# Patient Record
Sex: Female | Born: 1990 | Race: Black or African American | Hispanic: No | Marital: Single | Smoking: Former smoker
Health system: Southern US, Community
[De-identification: ages and names within clinical notes are randomized; demographics above are authoritative.]

## PROBLEM LIST (undated history)

## (undated) ENCOUNTER — Inpatient Hospital Stay (HOSPITAL_COMMUNITY): Payer: Self-pay

## (undated) DIAGNOSIS — K429 Umbilical hernia without obstruction or gangrene: Secondary | ICD-10-CM

## (undated) DIAGNOSIS — I219 Acute myocardial infarction, unspecified: Secondary | ICD-10-CM

## (undated) DIAGNOSIS — I213 ST elevation (STEMI) myocardial infarction of unspecified site: Secondary | ICD-10-CM

## (undated) DIAGNOSIS — I2542 Coronary artery dissection: Secondary | ICD-10-CM

## (undated) DIAGNOSIS — I319 Disease of pericardium, unspecified: Secondary | ICD-10-CM

## (undated) DIAGNOSIS — I249 Acute ischemic heart disease, unspecified: Secondary | ICD-10-CM

## (undated) DIAGNOSIS — B999 Unspecified infectious disease: Secondary | ICD-10-CM

## (undated) HISTORY — PX: CARDIAC CATHETERIZATION: SHX172

## (undated) HISTORY — PX: INDUCED ABORTION: SHX677

## (undated) HISTORY — PX: WISDOM TOOTH EXTRACTION: SHX21

---

## 2009-08-28 ENCOUNTER — Other Ambulatory Visit: Payer: Self-pay | Admitting: Emergency Medicine

## 2009-08-28 ENCOUNTER — Inpatient Hospital Stay (HOSPITAL_COMMUNITY): Admission: AD | Admit: 2009-08-28 | Discharge: 2009-08-28 | Payer: Self-pay | Admitting: Obstetrics & Gynecology

## 2009-08-29 ENCOUNTER — Inpatient Hospital Stay (HOSPITAL_COMMUNITY): Admission: AD | Admit: 2009-08-29 | Discharge: 2009-08-29 | Payer: Self-pay | Admitting: Obstetrics & Gynecology

## 2009-09-14 ENCOUNTER — Ambulatory Visit: Payer: Self-pay | Admitting: Obstetrics and Gynecology

## 2009-09-28 ENCOUNTER — Ambulatory Visit: Payer: Self-pay | Admitting: Obstetrics and Gynecology

## 2009-10-04 ENCOUNTER — Ambulatory Visit (HOSPITAL_COMMUNITY): Admission: RE | Admit: 2009-10-04 | Discharge: 2009-10-04 | Payer: Self-pay | Admitting: Obstetrics & Gynecology

## 2009-10-12 ENCOUNTER — Ambulatory Visit: Payer: Self-pay | Admitting: Obstetrics & Gynecology

## 2009-10-26 ENCOUNTER — Ambulatory Visit: Payer: Self-pay | Admitting: Obstetrics and Gynecology

## 2009-10-27 ENCOUNTER — Inpatient Hospital Stay (HOSPITAL_COMMUNITY): Admission: AD | Admit: 2009-10-27 | Discharge: 2009-10-27 | Payer: Self-pay | Admitting: Obstetrics & Gynecology

## 2009-11-01 ENCOUNTER — Ambulatory Visit: Payer: Self-pay | Admitting: Family

## 2009-11-01 ENCOUNTER — Inpatient Hospital Stay (HOSPITAL_COMMUNITY): Admission: AD | Admit: 2009-11-01 | Discharge: 2009-11-03 | Payer: Self-pay | Admitting: Obstetrics and Gynecology

## 2010-06-18 HISTORY — PX: SALPINGECTOMY: SHX328

## 2010-07-31 ENCOUNTER — Emergency Department (HOSPITAL_COMMUNITY): Payer: Self-pay

## 2010-07-31 ENCOUNTER — Emergency Department (HOSPITAL_COMMUNITY)
Admission: EM | Admit: 2010-07-31 | Discharge: 2010-07-31 | Disposition: A | Discharge status: Home / Self Care | Payer: Self-pay | Attending: Emergency Medicine | Admitting: Emergency Medicine

## 2010-07-31 DIAGNOSIS — N83209 Unspecified ovarian cyst, unspecified side: Secondary | ICD-10-CM | POA: Insufficient documentation

## 2010-07-31 DIAGNOSIS — N949 Unspecified condition associated with female genital organs and menstrual cycle: Secondary | ICD-10-CM | POA: Insufficient documentation

## 2010-07-31 LAB — CBC
Hemoglobin: 13.8 g/dL (ref 12.0–15.0)
MCH: 30.5 pg (ref 26.0–34.0)
MCV: 88.7 fL (ref 78.0–100.0)
RBC: 4.53 MIL/uL (ref 3.87–5.11)
RDW: 13.1 % (ref 11.5–15.5)
WBC: 4.8 10*3/uL (ref 4.0–10.5)

## 2010-07-31 LAB — URINALYSIS, ROUTINE W REFLEX MICROSCOPIC
Ketones, ur: 15 mg/dL — AB
Nitrite: NEGATIVE
Protein, ur: 30 mg/dL — AB
Specific Gravity, Urine: 1.029 (ref 1.005–1.030)
Urine Glucose, Fasting: NEGATIVE mg/dL
Urobilinogen, UA: 1 mg/dL (ref 0.0–1.0)

## 2010-07-31 LAB — DIFFERENTIAL
Basophils Relative: 0 % (ref 0–1)
Eosinophils Absolute: 0 10*3/uL (ref 0.0–0.7)
Lymphs Abs: 1.8 10*3/uL (ref 0.7–4.0)
Monocytes Absolute: 0.3 10*3/uL (ref 0.1–1.0)

## 2010-07-31 LAB — URINE MICROSCOPIC-ADD ON

## 2010-07-31 LAB — HCG, QUANTITATIVE, PREGNANCY: hCG, Beta Chain, Quant, S: <2 m[IU]/mL (ref <5)

## 2010-07-31 LAB — HEPATIC FUNCTION PANEL
Albumin: 3.7 g/dL (ref 3.5–5.2)
Bilirubin, Direct: 0.2 mg/dL (ref 0.0–0.3)
Total Bilirubin: 1.1 mg/dL (ref 0.3–1.2)
Total Protein: 6.5 g/dL (ref 6.0–8.3)

## 2010-07-31 LAB — WET PREP, GENITAL: WBC, Wet Prep HPF POC: NONE SEEN

## 2010-07-31 LAB — BASIC METABOLIC PANEL
CO2: 23 mEq/L (ref 19–32)
Calcium: 8.9 mg/dL (ref 8.4–10.5)
Creatinine, Ser: 0.7 mg/dL (ref 0.4–1.2)
GFR calc Af Amer: >60 mL/min (ref >60)
GFR calc non Af Amer: >60 mL/min (ref >60)
Glucose, Bld: 95 mg/dL (ref 70–99)
Sodium: 139 mEq/L (ref 135–145)

## 2010-07-31 LAB — POCT PREGNANCY, URINE: Preg Test, Ur: NEGATIVE

## 2010-08-01 LAB — GC/CHLAMYDIA PROBE AMP, GENITAL
Chlamydia, DNA Probe: NEGATIVE
GC Probe Amp, Genital: NEGATIVE

## 2010-09-04 LAB — CBC
Hemoglobin: 13.5 g/dL (ref 12.0–15.0)
MCV: 90.7 fL (ref 78.0–100.0)
Platelets: 231 10*3/uL (ref 150–400)
RDW: 15.2 % (ref 11.5–15.5)

## 2010-09-04 LAB — RAPID URINE DRUG SCREEN, HOSP PERFORMED
Barbiturates: NOT DETECTED
Benzodiazepines: NOT DETECTED
Cocaine: NOT DETECTED

## 2010-09-05 LAB — POCT URINALYSIS DIP (DEVICE)
Bilirubin Urine: NEGATIVE
Glucose, UA: NEGATIVE mg/dL
Ketones, ur: NEGATIVE mg/dL
Ketones, ur: NEGATIVE mg/dL
Protein, ur: 100 mg/dL — AB
Specific Gravity, Urine: 1.025 (ref 1.005–1.030)
Specific Gravity, Urine: 1.025 (ref 1.005–1.030)
Urobilinogen, UA: 0.2 mg/dL (ref 0.0–1.0)

## 2010-09-05 LAB — WET PREP, GENITAL: Clue Cells Wet Prep HPF POC: NONE SEEN

## 2010-09-06 LAB — POCT URINALYSIS DIP (DEVICE)
Bilirubin Urine: NEGATIVE
Glucose, UA: NEGATIVE mg/dL
Hgb urine dipstick: NEGATIVE
Specific Gravity, Urine: 1.02 (ref 1.005–1.030)
pH: 5 (ref 5.0–8.0)

## 2010-09-11 LAB — CBC
HCT: 34 % — ABNORMAL LOW (ref 36.0–46.0)
Hemoglobin: 11.3 g/dL — ABNORMAL LOW (ref 12.0–15.0)
MCHC: 33.3 g/dL (ref 30.0–36.0)
MCV: 92.8 fL (ref 78.0–100.0)
Platelets: 263 10*3/uL (ref 150–400)
RBC: 3.49 MIL/uL — ABNORMAL LOW (ref 3.87–5.11)
RBC: 3.67 MIL/uL — ABNORMAL LOW (ref 3.87–5.11)
WBC: 10.5 10*3/uL (ref 4.0–10.5)
WBC: 11.1 10*3/uL — ABNORMAL HIGH (ref 4.0–10.5)

## 2010-09-11 LAB — BASIC METABOLIC PANEL
BUN: 7 mg/dL (ref 6–23)
Calcium: 8.6 mg/dL (ref 8.4–10.5)
Creatinine, Ser: 0.64 mg/dL (ref 0.4–1.2)
GFR calc Af Amer: >60 mL/min (ref >60)
GFR calc non Af Amer: >60 mL/min (ref >60)

## 2010-09-11 LAB — WET PREP, GENITAL: Trich, Wet Prep: NONE SEEN

## 2010-09-11 LAB — URINALYSIS, ROUTINE W REFLEX MICROSCOPIC
Glucose, UA: NEGATIVE mg/dL
Glucose, UA: NEGATIVE mg/dL
Protein, ur: NEGATIVE mg/dL
Protein, ur: NEGATIVE mg/dL
Specific Gravity, Urine: 1.02 (ref 1.005–1.030)
pH: 6 (ref 5.0–8.0)

## 2010-09-11 LAB — HIV ANTIBODY (ROUTINE TESTING W REFLEX): HIV: NONREACTIVE

## 2010-09-11 LAB — GC/CHLAMYDIA PROBE AMP, GENITAL
Chlamydia, DNA Probe: POSITIVE — AB
GC Probe Amp, Genital: POSITIVE — AB

## 2010-09-11 LAB — DIFFERENTIAL
Basophils Relative: 1 % (ref 0–1)
Eosinophils Absolute: 0.2 10*3/uL (ref 0.0–0.7)
Eosinophils Absolute: 0.2 10*3/uL (ref 0.0–0.7)
Eosinophils Relative: 2 % (ref 0–5)
Lymphocytes Relative: 15 % (ref 12–46)
Lymphs Abs: 1.1 10*3/uL (ref 0.7–4.0)
Lymphs Abs: 1.6 10*3/uL (ref 0.7–4.0)
Monocytes Absolute: 0.4 10*3/uL (ref 0.1–1.0)
Monocytes Relative: 3 % (ref 3–12)
Neutro Abs: 8.2 10*3/uL — ABNORMAL HIGH (ref 1.7–7.7)
Neutrophils Relative %: 78 % — ABNORMAL HIGH (ref 43–77)

## 2010-09-11 LAB — POCT URINALYSIS DIP (DEVICE)
Bilirubin Urine: NEGATIVE
Glucose, UA: NEGATIVE mg/dL
Hgb urine dipstick: NEGATIVE
Nitrite: NEGATIVE
Specific Gravity, Urine: 1.01 (ref 1.005–1.030)
Urobilinogen, UA: 0.2 mg/dL (ref 0.0–1.0)

## 2010-09-11 LAB — URINE MICROSCOPIC-ADD ON

## 2010-09-11 LAB — URINE CULTURE: Colony Count: 35000

## 2010-09-11 LAB — SICKLE CELL SCREEN: Sickle Cell Screen: NEGATIVE

## 2010-09-11 LAB — ABO/RH: ABO/RH(D): A POS

## 2010-11-15 ENCOUNTER — Emergency Department (HOSPITAL_COMMUNITY)
Admission: EM | Admit: 2010-11-15 | Discharge: 2010-11-16 | Disposition: A | Discharge status: Home / Self Care | Payer: Self-pay | Attending: Emergency Medicine | Admitting: Emergency Medicine

## 2010-11-15 DIAGNOSIS — R197 Diarrhea, unspecified: Secondary | ICD-10-CM | POA: Insufficient documentation

## 2010-11-15 DIAGNOSIS — O4690 Antepartum hemorrhage, unspecified, unspecified trimester: Secondary | ICD-10-CM | POA: Insufficient documentation

## 2010-11-15 DIAGNOSIS — O98819 Other maternal infectious and parasitic diseases complicating pregnancy, unspecified trimester: Secondary | ICD-10-CM | POA: Insufficient documentation

## 2010-11-15 DIAGNOSIS — R109 Unspecified abdominal pain: Secondary | ICD-10-CM | POA: Insufficient documentation

## 2010-11-15 DIAGNOSIS — N949 Unspecified condition associated with female genital organs and menstrual cycle: Secondary | ICD-10-CM | POA: Insufficient documentation

## 2010-11-15 DIAGNOSIS — A599 Trichomoniasis, unspecified: Secondary | ICD-10-CM | POA: Insufficient documentation

## 2010-11-15 DIAGNOSIS — R112 Nausea with vomiting, unspecified: Secondary | ICD-10-CM | POA: Insufficient documentation

## 2010-11-15 LAB — URINALYSIS, ROUTINE W REFLEX MICROSCOPIC
Glucose, UA: NEGATIVE mg/dL
Ketones, ur: NEGATIVE mg/dL
Nitrite: NEGATIVE
Protein, ur: NEGATIVE mg/dL
pH: 7.5 (ref 5.0–8.0)

## 2010-11-15 LAB — BASIC METABOLIC PANEL
BUN: 10 mg/dL (ref 6–23)
Calcium: 9.3 mg/dL (ref 8.4–10.5)
Creatinine, Ser: <0.47 mg/dL (ref 0.4–1.2)
Glucose, Bld: 86 mg/dL (ref 70–99)

## 2010-11-15 LAB — CBC
HCT: 37.8 % (ref 36.0–46.0)
MCH: 29.3 pg (ref 26.0–34.0)
MCHC: 33.6 g/dL (ref 30.0–36.0)
MCV: 87.3 fL (ref 78.0–100.0)
Platelets: 319 10*3/uL (ref 150–400)
RDW: 12.5 % (ref 11.5–15.5)

## 2010-11-15 LAB — POCT PREGNANCY, URINE: Preg Test, Ur: POSITIVE

## 2010-11-15 LAB — DIFFERENTIAL
Eosinophils Absolute: 0 10*3/uL (ref 0.0–0.7)
Eosinophils Relative: 1 % (ref 0–5)
Lymphocytes Relative: 46 % (ref 12–46)
Lymphs Abs: 3 10*3/uL (ref 0.7–4.0)
Monocytes Absolute: 0.4 10*3/uL (ref 0.1–1.0)

## 2010-11-15 LAB — URINE MICROSCOPIC-ADD ON

## 2010-11-16 ENCOUNTER — Emergency Department (HOSPITAL_COMMUNITY): Payer: Self-pay

## 2010-11-16 LAB — GC/CHLAMYDIA PROBE AMP, GENITAL: Chlamydia, DNA Probe: NEGATIVE

## 2010-11-16 LAB — WET PREP, GENITAL: Clue Cells Wet Prep HPF POC: NONE SEEN

## 2010-11-18 ENCOUNTER — Inpatient Hospital Stay (HOSPITAL_COMMUNITY): Payer: Self-pay

## 2010-11-18 ENCOUNTER — Inpatient Hospital Stay (HOSPITAL_COMMUNITY)
Admission: EM | Admit: 2010-11-18 | Discharge: 2010-11-18 | Disposition: A | Discharge status: Home / Self Care | Payer: Self-pay | Source: Ambulatory Visit | Attending: Obstetrics & Gynecology | Admitting: Obstetrics & Gynecology

## 2010-11-18 DIAGNOSIS — O200 Threatened abortion: Secondary | ICD-10-CM

## 2010-11-18 LAB — HCG, QUANTITATIVE, PREGNANCY: hCG, Beta Chain, Quant, S: 4805 m[IU]/mL — ABNORMAL HIGH (ref <5)

## 2010-11-21 ENCOUNTER — Ambulatory Visit (HOSPITAL_COMMUNITY)
Admission: AD | Admit: 2010-11-21 | Discharge: 2010-11-22 | Disposition: A | Discharge status: Home / Self Care | Payer: Self-pay | Source: Ambulatory Visit | Attending: Family Medicine | Admitting: Family Medicine

## 2010-11-21 DIAGNOSIS — O00109 Unspecified tubal pregnancy without intrauterine pregnancy: Secondary | ICD-10-CM | POA: Insufficient documentation

## 2010-11-22 ENCOUNTER — Other Ambulatory Visit: Payer: Self-pay | Admitting: Family Medicine

## 2010-11-22 ENCOUNTER — Inpatient Hospital Stay (HOSPITAL_COMMUNITY): Payer: Self-pay

## 2010-11-22 DIAGNOSIS — O00109 Unspecified tubal pregnancy without intrauterine pregnancy: Secondary | ICD-10-CM

## 2010-11-22 LAB — CBC
Hemoglobin: 12.1 g/dL (ref 12.0–15.0)
MCH: 30.1 pg (ref 26.0–34.0)
RBC: 4.02 MIL/uL (ref 3.87–5.11)
WBC: 7.8 10*3/uL (ref 4.0–10.5)

## 2010-11-22 LAB — HCG, QUANTITATIVE, PREGNANCY: hCG, Beta Chain, Quant, S: 3421 m[IU]/mL — ABNORMAL HIGH (ref <5)

## 2010-11-26 LAB — CROSSMATCH
Antibody Screen: NEGATIVE
Unit division: 0

## 2010-11-30 NOTE — Op Note (Signed)
Jenny Myers, Jenny Myers NO.:  [id number]  MEDICAL RECORD NO.:  [id number]  LOCATION:  [facility]                          FACILITY:  [facility]  PHYSICIAN:  Tanya S. Shawnie Pons, M.D.   DATE OF BIRTH:  [birth year]  DATE OF PROCEDURE: DATE OF DISCHARGE:  12/09/2010                              OPERATIVE REPORT   PREOPERATIVE DIAGNOSIS:  Right ectopic pregnancy.  POSTOPERATIVE DIAGNOSIS:  Right ectopic pregnancy.  PROCEDURE:  Laparoscopic right salpingectomy.  SURGEON:  Shelbie Proctor. Shawnie Pons, MD  ASSISTANT:  None.  ANESTHESIA:  General local.  FINDINGS:  Ruptured ectopic, hemoperitoneum, normal-appearing left tube, normal-appearing liver edge.  SPECIMENS:  Right tube to Pathology.  BLOOD LOSS:  100 mL.  COMPLICATIONS:  None known.  REASON FOR PROCEDURE:  Briefly, the patient is a 20 year old gravida 2, para 1-0-0-1 who had been followed for what was presumed to be a miscarriage.  She was seen at [facility] with beta of  and an intrauterine gestational sac but no crown-rump length.  The patient was came back in with increasing pain on November 18, 2010, had a beta that was 4800 and loss of intrauterine gestational sac and it was felt she had a miscarriage.  The patient came in via EMS tonight with increasing pain. She received two Dilaudid in MAU which really did not touch her pain.  A repeat ultrasound was done and a quantitative beta HCG which showed her quant to be 3400, but ultrasound showed a ruptured ectopic pregnancy. For this reason, she was consented for the OR and informed risks and benefits were discussed with her.  PROCEDURE:  The patient was taken to the OR where she was placed in dorsal lithotomy in [place] stirrups.  She was prepped and draped in the usual sterile fashion.  A Foley catheter was placed inside her bladder. A speculum was placed at the vagina.  Cervix was anterior, uterus was retroverted.  The anterior lip of the cervix was grasped with single- tooth  tenaculum.  A Hulka tenaculum was placed through the cervix to manipulate the uterus.  Attention was then turned to the abdomen. Marcaine 0.25% 1.5 mL were injected to umbilicus.  Two Allis clamps were used to elevate the umbilicus.  A knife was used to make a vertical incision through the umbilicus.  The peritoneal cavity was entered.  Two edges of the fascia at the umbilicus were tied with 0 Vicryl sutures on UR-6.  A trocar was placed through this incision.  A pneumoperitoneum was created.  The operative scope was then used and a right ectopic pregnancy was found.  A 5-mm port was placed in the left lower quadrant under direct visualization with care to avoid the vessels, however, on initial incision a good amount of bleeding was noted.  The trocar was inserted through this incision whicj tamponaded any bleeding.  The tube was then grasped, lifted up the gyrus used at the operative scope port  to remove the tube.  Excellent hemostasis was noted at the site of the tube.   The right tube and liver edge were inspected and felt to be normal.  The operative scope was then removed from the abdomen and 5-mm  scope was used through the lower port and EndoCatch bag used to scoop the specimen into it.  This was removed through the umbilical incision.  The trocar was replaced.  The Nezhat was used to evacuate the hemoperitoneum and the posterior cul-de-sac.  Excellent hemostasis was again noted.  The 5- mm port was removed under direct visualization.  There was no bleeding noted from the intra-abdominally or externally.  The umbilical port was removed and pneumoperitoneum let down.  The aforementioned 0 Vicryl sutures on the UR-6 was used to close the umbilical fascia.  A 3-0 Vicryl on X-1 was used for subcuticular closure of the umbilicus. Subcuticular closure was undertaken at the 5-mm lower port.  Dermabond was placed over this port and pressure dressing, given the previous bleeding.  The Foley  catheter and Hulka tenaculum removed from the patient.  All instrument counts were correct x2.  The patient was awakened and taken to recovery room in stable condition.     Shelbie Proctor. Shawnie Pons, M.D.     TSP/MEDQ  D:  26-Nov-2010  T:  11-26-2010  Job:  [id number]  Electronically Signed by Tinnie Gens M.D. on 11/30/2010 03:21:36 PM

## 2010-12-08 ENCOUNTER — Ambulatory Visit: Payer: Self-pay | Admitting: Family Medicine

## 2010-12-17 DEATH — deceased

## 2011-06-19 NOTE — L&D Delivery Note (Signed)
Delivery Note At 3:36 AM a viable female was delivered via  (Presentation: ;  ).  APGAR: , ; weight .   Placenta status: , .  Cord:  with the following complications: .  Cord pH: not done  Anesthesia:   Episiotomy:  Lacerations:  Suture Repair: 2.0 Est. Blood Loss (mL):   Mom to postpartum.  Baby to nursery-stable.  Kynzi Levay A 02/27/2012, 3:44 AM

## 2011-07-01 ENCOUNTER — Emergency Department (HOSPITAL_COMMUNITY)
Admission: EM | Admit: 2011-07-01 | Discharge: 2011-07-02 | Disposition: A | Discharge status: Home / Self Care | Payer: Self-pay | Attending: Emergency Medicine | Admitting: Emergency Medicine

## 2011-07-01 ENCOUNTER — Encounter (HOSPITAL_COMMUNITY): Payer: Self-pay | Admitting: Emergency Medicine

## 2011-07-01 DIAGNOSIS — O210 Mild hyperemesis gravidarum: Secondary | ICD-10-CM | POA: Insufficient documentation

## 2011-07-01 DIAGNOSIS — E162 Hypoglycemia, unspecified: Secondary | ICD-10-CM

## 2011-07-01 DIAGNOSIS — Z331 Pregnant state, incidental: Secondary | ICD-10-CM

## 2011-07-01 LAB — POCT I-STAT, CHEM 8
BUN: 13 mg/dL (ref 6–23)
Calcium, Ion: 1.08 mmol/L — ABNORMAL LOW (ref 1.12–1.32)
Creatinine, Ser: 0.5 mg/dL (ref 0.50–1.10)
Glucose, Bld: 67 mg/dL — ABNORMAL LOW (ref 70–99)
Hemoglobin: 11.9 g/dL — ABNORMAL LOW (ref 12.0–15.0)
Sodium: 140 mEq/L (ref 135–145)
TCO2: 18 mmol/L (ref 0–100)

## 2011-07-01 MED ORDER — ONDANSETRON HCL 4 MG/2ML IJ SOLN
4.0000 mg | Freq: Once | INTRAMUSCULAR | Status: AC
  Administered 2011-07-01: 4 mg via INTRAVENOUS
  Filled 2011-07-01: qty 2

## 2011-07-01 MED ORDER — SODIUM CHLORIDE 0.9 % IV SOLN
Freq: Once | INTRAVENOUS | Status: AC
  Administered 2011-07-01: via INTRAVENOUS

## 2011-07-01 NOTE — ED Notes (Signed)
ZOX:[id number]<AV> Expected date:07/01/11<BR> Expected time:10:28 PM<BR> Means of arrival:Ambulance<BR> Comments:<BR> EMS- Diarrhea

## 2011-07-01 NOTE — ED Notes (Signed)
WUJ:[id number]<NW> Expected date:07/01/11<BR> Expected time:10:26 PM<BR> Means of arrival:Ambulance<BR> Comments:<BR> EMS 261 gC, 20 yof N/v

## 2011-07-01 NOTE — ED Provider Notes (Signed)
History     CSN: [id number]  Arrival date & time 07/01/11  2228   First MD Initiated Contact with Patient 07/01/11 2247      Chief Complaint  Patient presents with  . Nausea  . Emesis    (Consider location/radiation/quality/duration/timing/severity/associated sxs/prior treatment) HPI Comments: 21 year old otherwise healthy female presents with vomiting and nausea. Symptoms started approximately 5:00 AM, has been intermittent throughout the day but have constipation and inability to tolerate fluids or foods. She has associated chills but no dysuria, one episode of watery diarrhea this evening, has noted some blood in her emesis. Nothing seems to make this better, worse with trying to ingest food or fluids. She does not recall any sick contacts, any foods that were suspicious for food poisoning. Patient denies associated fevers, cough, shortness of breath, back pain, myalgias, swelling, rash, numbness, weakness. Symptoms are moderate at this time  Patient is a 21 y.o. female presenting with vomiting. The history is provided by the patient and the EMS personnel.  Emesis     History reviewed. No pertinent past medical history.  History reviewed. No pertinent past surgical history.  History reviewed. No pertinent family history.  History  Substance Use Topics  . Smoking status: Never Smoker   . Smokeless tobacco: Not on file  . Alcohol Use: No    OB History    Grav Para Term Preterm Abortions TAB SAB Ect Mult Living                  Review of Systems  Gastrointestinal: Positive for vomiting.  All other systems reviewed and are negative.    Allergies  Penicillins  Home Medications   Current Outpatient Rx  Name Route Sig Dispense Refill  . ONDANSETRON 4 MG PO TBDP Oral Take 1 tablet (4 mg total) by mouth every 8 (eight) hours as needed for nausea. 10 tablet 0    BP 124/38  Pulse 87  Temp(Src) 98.7 F (37.1 C) (Oral)  SpO2 100%  Physical Exam  Nursing note and  vitals reviewed. Constitutional: She appears well-developed and well-nourished. No distress.  HENT:  Head: Normocephalic and atraumatic.  Mouth/Throat: No oropharyngeal exudate.       Mucous membranes slightly dehydrated  Eyes: Conjunctivae and EOM are normal. Pupils are equal, round, and reactive to light. Right eye exhibits no discharge. Left eye exhibits no discharge. No scleral icterus.  Neck: Normal range of motion. Neck supple. No JVD present. No thyromegaly present.  Cardiovascular: Normal rate, regular rhythm, normal heart sounds and intact distal pulses.  Exam reveals no gallop and no friction rub.   No murmur heard. Pulmonary/Chest: Effort normal and breath sounds normal. No respiratory distress. She has no wheezes. She has no rales.  Abdominal: Soft. Bowel sounds are normal. She exhibits no distension and no mass. There is no tenderness.  Musculoskeletal: Normal range of motion. She exhibits no edema and no tenderness.  Lymphadenopathy:    She has no cervical adenopathy.  Neurological: She is alert. Coordination normal.  Skin: Skin is warm and dry. No rash noted. No erythema.  Psychiatric: She has a normal mood and affect. Her behavior is normal.    ED Course  Procedures (including critical care time)  Labs Reviewed  POCT I-STAT, CHEM 8 - Abnormal; Notable for the following:    Glucose, Bld 67 (*)    Calcium, Ion 1.08 (*)    Hemoglobin 11.9 (*)    HCT 35.0 (*)    All other components within  normal limits  PREGNANCY, URINE  I-STAT, CHEM 8   No results found.   1. Nausea and vomiting   2. Pregnancy as incidental finding   3. Hypoglycemia       MDM  Patient has normal vital signs, no fever, no tachycardia and normal blood pressure. She appears slightly dehydrated. We'll check urinalysis, pregnancy, IV fluids and Zofran and reevaluate.    Patient rechecked after IV fluids, Zofran, lab results showing positive pregnancy and normal potassium slightly low sugar.   Patient has been able to tolerate fluids, has been informed of her positive pregnancy test and has been given [facility]. He has tolerated fluids by mouth prior to discharge.  Vida Roller, MD 07/02/11 0120

## 2011-07-01 NOTE — ED Notes (Signed)
Per ems, the patient has been n/v since 5 am today and had pork chops at 2 hours ago from now.  Patient has held these in.

## 2011-07-02 MED ORDER — ONDANSETRON 4 MG PO TBDP: 4.0000 mg | ORAL_TABLET | Freq: Three times a day (TID) | ORAL | Status: AC | PRN

## 2011-07-02 NOTE — ED Notes (Signed)
Patient stable.

## 2011-07-15 ENCOUNTER — Inpatient Hospital Stay (HOSPITAL_COMMUNITY)
Admission: AD | Admit: 2011-07-15 | Discharge: 2011-07-16 | Disposition: A | Discharge status: Home / Self Care | Payer: Self-pay | Source: Ambulatory Visit | Attending: Obstetrics & Gynecology | Admitting: Obstetrics & Gynecology

## 2011-07-15 ENCOUNTER — Inpatient Hospital Stay (HOSPITAL_COMMUNITY): Payer: Self-pay

## 2011-07-15 ENCOUNTER — Encounter (HOSPITAL_COMMUNITY): Payer: Self-pay | Admitting: *Deleted

## 2011-07-15 DIAGNOSIS — O26859 Spotting complicating pregnancy, unspecified trimester: Secondary | ICD-10-CM | POA: Insufficient documentation

## 2011-07-15 DIAGNOSIS — O418X90 Other specified disorders of amniotic fluid and membranes, unspecified trimester, not applicable or unspecified: Secondary | ICD-10-CM

## 2011-07-15 DIAGNOSIS — R1032 Left lower quadrant pain: Secondary | ICD-10-CM | POA: Insufficient documentation

## 2011-07-15 DIAGNOSIS — O09299 Supervision of pregnancy with other poor reproductive or obstetric history, unspecified trimester: Secondary | ICD-10-CM

## 2011-07-15 LAB — URINALYSIS, ROUTINE W REFLEX MICROSCOPIC
Bilirubin Urine: NEGATIVE
Glucose, UA: NEGATIVE mg/dL
Hgb urine dipstick: NEGATIVE
Ketones, ur: NEGATIVE mg/dL
Protein, ur: NEGATIVE mg/dL
Urobilinogen, UA: 0.2 mg/dL (ref 0.0–1.0)

## 2011-07-15 LAB — WET PREP, GENITAL
Clue Cells Wet Prep HPF POC: NONE SEEN
Trich, Wet Prep: NONE SEEN
Yeast Wet Prep HPF POC: NONE SEEN

## 2011-07-15 NOTE — ED Provider Notes (Signed)
History   Jenny Myers is a 21 y.o. year old [id number] female at [redacted]w[redacted]d weeks gestation who presents to MAU reporting bright red spotting and mild LLQ cramping starting at 2100. She denies any passage of tissue.  CSN: [id number]  Arrival date & time 07/15/11  2229   None    Chief Complaint  Patient presents with  . Abdominal Pain  Vaginal bleeding  (Consider location/radiation/quality/duration/timing/severity/associated sxs/prior treatment) HPI  Past Medical History  Diagnosis Date  . No pertinent past medical history     Past Surgical History  Procedure Date  . Salpingectomy   . Wisdom tooth extraction     Family History  Problem Relation Age of Onset  . Hypertension Mother   . Diabetes Father   . Hypertension Father   . Vision loss Paternal Grandmother     History  Substance Use Topics  . Smoking status: Never Smoker   . Smokeless tobacco: Not on file  . Alcohol Use: No    OB History    Grav Para Term Preterm Abortions TAB SAB Ect Mult Living   3 1 0 1 1  1   1       Review of Systems: Otherwise neg  Allergies  Garlic; Penicillins; and Carrot flavor  Home Medications  No current outpatient prescriptions on file.  BP 122/58  Pulse 87  Temp(Src) 98.3 F (36.8 C) (Oral)  Resp 16  Ht 5' (1.524 m)  Wt 41.549 kg (91 lb 9.6 oz)  BMI 17.89 kg/m2  LMP 05/08/2011  Physical Exam  Constitutional: She is oriented to person, place, and time. She appears well-developed and well-nourished. No distress.  Cardiovascular: Normal rate.   Pulmonary/Chest: Effort normal.  Abdominal: Soft. There is no tenderness.  Genitourinary: There is no lesion on the right labia. There is no lesion on the left labia. Uterus is enlarged. Uterus is not tender. Cervix exhibits no motion tenderness, no discharge and no friability. Right adnexum displays no mass and no tenderness. Left adnexum displays no mass and no tenderness. There is bleeding (scant tan discharge) around the vagina.  No vaginal discharge found.  Neurological: She is alert and oriented to person, place, and time.  Skin: Skin is warm and dry.  Psychiatric: She has a normal mood and affect.    ED Course  Procedures (including critical care time)  Labs Reviewed  URINALYSIS, ROUTINE W REFLEX MICROSCOPIC - Abnormal; Notable for the following:    Specific Gravity, Urine >1.030 (*)    All other components within normal limits  POCT PREGNANCY, URINE - Abnormal; Notable for the following:    Preg Test, Ur POSITIVE (*)    All other components within normal limits  WET PREP, GENITAL - Abnormal; Notable for the following:    WBC, Wet Prep HPF POC FEW (*) MODERATE BACTERIA SEEN   All other components within normal limits  POCT PREGNANCY, URINE  GC/CHLAMYDIA PROBE AMP, GENITAL   A POS  US Ob Comp Less 14 Wks  07/16/2011  *RADIOLOGY REPORT*  Clinical Data: Left lower quadrant pain and spotting.  Estimated gestational age by LMP is 9 weeks 5 days.  OBSTETRIC <14 WK ULTRASOUND  Technique:  Transabdominal ultrasound was performed for evaluation of the gestation as well as the maternal uterus and adnexal regions.  Comparison:  None.  Intrauterine gestational sac: A single intrauterine gestational sac is visualized. Small subchorionic hemorrhage is demonstrated. Yolk sac: Demonstrated Embryo: Demonstrated Cardiac Activity: Demonstrated Heart Rate: 172  CRL:  21 mm  8w  5d           [place] EDC: 02/19/2012  Maternal uterus/Adnexae: Ovaries appear symmetrical.  Corpus luteum cyst on the right. No free pelvic fluid collections.  IMPRESSION: Single intrauterine pregnancy.  Estimated gestational age by crown- rump length is 8 weeks 5 days.  Original Report Authenticated By: Marlon Pel, M.D.   1. Subchorionic hemorrhage of placenta   8.5 week IUP  MDM  Plan: 1. D/C home 2. Pelvic rest x 1 week 3. Start PNC 4. Pregnancy verification letter given 5. Bleeding precautions  Dorathy Kinsman 07/16/2011 1:31  AM

## 2011-07-15 NOTE — Progress Notes (Signed)
Pt c/o red spotting and left lower abd pain that came on suddenly @ 2100 while cooking.

## 2011-07-15 NOTE — Progress Notes (Signed)
Pt G3 P1 at 9wks, EDC 02/12/2012, having sharp LLQ pain and spotting x 1 hr.

## 2011-07-16 ENCOUNTER — Encounter (HOSPITAL_COMMUNITY): Payer: Self-pay | Admitting: Advanced Practice Midwife

## 2011-07-16 DIAGNOSIS — O09299 Supervision of pregnancy with other poor reproductive or obstetric history, unspecified trimester: Secondary | ICD-10-CM

## 2011-07-16 DIAGNOSIS — Z8759 Personal history of other complications of pregnancy, childbirth and the puerperium: Secondary | ICD-10-CM | POA: Insufficient documentation

## 2011-07-19 NOTE — ED Provider Notes (Signed)
Agree with above note.  Jenny Bernstein H. 07/19/2011 3:42 AM

## 2011-09-03 ENCOUNTER — Inpatient Hospital Stay (HOSPITAL_COMMUNITY)
Admission: AD | Admit: 2011-09-03 | Discharge: 2011-09-04 | Disposition: A | Discharge status: Home / Self Care | Payer: Self-pay | Attending: Obstetrics & Gynecology | Admitting: Obstetrics & Gynecology

## 2011-09-03 DIAGNOSIS — K529 Noninfective gastroenteritis and colitis, unspecified: Secondary | ICD-10-CM

## 2011-09-03 DIAGNOSIS — O99891 Other specified diseases and conditions complicating pregnancy: Secondary | ICD-10-CM | POA: Insufficient documentation

## 2011-09-03 DIAGNOSIS — R109 Unspecified abdominal pain: Secondary | ICD-10-CM | POA: Insufficient documentation

## 2011-09-03 DIAGNOSIS — K5289 Other specified noninfective gastroenteritis and colitis: Secondary | ICD-10-CM

## 2011-09-04 ENCOUNTER — Encounter (HOSPITAL_COMMUNITY): Payer: Self-pay | Admitting: *Deleted

## 2011-09-04 LAB — CBC
MCV: 89.5 fL (ref 78.0–100.0)
Platelets: 272 10*3/uL (ref 150–400)
RBC: 4.1 MIL/uL (ref 3.87–5.11)
RDW: 13.2 % (ref 11.5–15.5)
WBC: 11.8 10*3/uL — ABNORMAL HIGH (ref 4.0–10.5)

## 2011-09-04 LAB — URINALYSIS, ROUTINE W REFLEX MICROSCOPIC
Glucose, UA: NEGATIVE mg/dL
Ketones, ur: 15 mg/dL — AB
Leukocytes, UA: NEGATIVE
Nitrite: NEGATIVE
Protein, ur: NEGATIVE mg/dL
pH: 6 (ref 5.0–8.0)

## 2011-09-04 LAB — DIFFERENTIAL
Basophils Absolute: 0 10*3/uL (ref 0.0–0.1)
Eosinophils Absolute: 0 10*3/uL (ref 0.0–0.7)
Eosinophils Relative: 0 % (ref 0–5)
Lymphocytes Relative: 7 % — ABNORMAL LOW (ref 12–46)
Lymphs Abs: 0.9 10*3/uL (ref 0.7–4.0)
Neutrophils Relative %: 89 % — ABNORMAL HIGH (ref 43–77)

## 2011-09-04 MED ORDER — ONDANSETRON HCL 8 MG PO TABS: 8.0000 mg | ORAL_TABLET | Freq: Three times a day (TID) | ORAL | Status: AC | PRN

## 2011-09-04 MED ORDER — SODIUM CHLORIDE 0.9 % IV SOLN
25.0000 mg | Freq: Once | INTRAVENOUS | Status: AC
  Administered 2011-09-04: 25 mg via INTRAVENOUS
  Filled 2011-09-04: qty 1

## 2011-09-04 NOTE — MAU Note (Signed)
Pt states she has been vomiting since about 1900

## 2011-09-04 NOTE — MAU Provider Note (Signed)
History     CSN: [id number]  Arrival date and time: 09/03/11 2358   First Provider Initiated Contact with Patient 09/04/11 0043      Chief Complaint  Patient presents with  . Abdominal Pain   HPI This is a 21 y.o. at [redacted]w[redacted]d who presents with lower abdominal cramping which started after she vomited at 7pm.  Her daughter had a stomach virus a few days ago, and they have taken her to the ED for continued vomiting. Denies fever or bleeding.  OB History    Grav Para Term Preterm Abortions TAB SAB Ect Mult Living   3 1 0 1 1  0 1  1      Past Medical History  Diagnosis Date  . No pertinent past medical history   . Ectopic pregnancy 11/2010    Right salpingectomy    Past Surgical History  Procedure Date  . Salpingectomy   . Wisdom tooth extraction     Family History  Problem Relation Age of Onset  . Hypertension Mother   . Diabetes Father   . Hypertension Father   . Vision loss Paternal Grandmother     History  Substance Use Topics  . Smoking status: Never Smoker   . Smokeless tobacco: Never Used  . Alcohol Use: No    Allergies:  Allergies  Allergen Reactions  . Garlic Anaphylaxis  . Penicillins Anaphylaxis and Nausea And Vomiting  . Carrot Flavor Hives    Prescriptions prior to admission  Medication Sig Dispense Refill  . acetaminophen (TYLENOL) 325 MG tablet Take 650 mg by mouth every 6 (six) hours as needed. For pain      . Prenatal Vit-Fe Fumarate-FA (PRENATAL MULTIVITAMIN) TABS Take 1 tablet by mouth daily.        ROS As above.  Physical Exam   Blood pressure 114/62, pulse 99, temperature 97.8 F (36.6 C), temperature source Oral, resp. rate 18, height 5' (1.524 m), weight 96 lb (43.545 kg), last menstrual period 05/08/2011.  Physical Exam  Constitutional: She is oriented to person, place, and time. She appears well-developed and well-nourished.  HENT:  Head: Normocephalic.  Cardiovascular: Normal rate.   Respiratory: Effort normal.  GI: Soft.  She exhibits no distension and no mass. There is tenderness (diffuse, but more tender lower abdomen). There is no rebound and no guarding.  Genitourinary: No vaginal discharge found.  Musculoskeletal: Normal range of motion.  Neurological: She is alert and oriented to person, place, and time.  Skin: Skin is warm and dry.  Psychiatric: She has a normal mood and affect.   Results for orders placed during the hospital encounter of 09/03/11 (from the past 24 hour(s))  URINALYSIS, ROUTINE W REFLEX MICROSCOPIC     Status: Abnormal   Collection Time   09/04/11 12:06 AM      Component Value Range   Color, Urine YELLOW  YELLOW    APPearance CLEAR  CLEAR    Specific Gravity, Urine >1.030 (*) 1.005 - 1.030    pH 6.0  5.0 - 8.0    Glucose, UA NEGATIVE  NEGATIVE (mg/dL)   Hgb urine dipstick NEGATIVE  NEGATIVE    Bilirubin Urine NEGATIVE  NEGATIVE    Ketones, ur 15 (*) NEGATIVE (mg/dL)   Protein, ur NEGATIVE  NEGATIVE (mg/dL)   Urobilinogen, UA 0.2  0.0 - 1.0 (mg/dL)   Nitrite NEGATIVE  NEGATIVE    Leukocytes, UA NEGATIVE  NEGATIVE   CBC     Status: Abnormal   Collection  Time   09/04/11 12:50 AM      Component Value Range   WBC 11.8 (*) 4.0 - 10.5 (K/uL)   RBC 4.10  3.87 - 5.11 (MIL/uL)   Hemoglobin 12.2  12.0 - 15.0 (g/dL)   HCT 16.1  09.6 - 04.5 (%)   MCV 89.5  78.0 - 100.0 (fL)   MCH 29.8  26.0 - 34.0 (pg)   MCHC 33.2  30.0 - 36.0 (g/dL)   RDW 40.9  81.1 - 91.4 (%)   Platelets 272  150 - 400 (K/uL)  DIFFERENTIAL     Status: Abnormal   Collection Time   09/04/11 12:50 AM      Component Value Range   Neutrophils Relative 89 (*) 43 - 77 (%)   Neutro Abs 10.4 (*) 1.7 - 7.7 (K/uL)   Lymphocytes Relative 7 (*) 12 - 46 (%)   Lymphs Abs 0.9  0.7 - 4.0 (K/uL)   Monocytes Relative 4  3 - 12 (%)   Monocytes Absolute 0.4  0.1 - 1.0 (K/uL)   Eosinophils Relative 0  0 - 5 (%)   Eosinophils Absolute 0.0  0.0 - 0.7 (K/uL)   Basophils Relative 0  0 - 1 (%)   Basophils Absolute 0.0  0.0 - 0.1  (K/uL)     MAU Course  Procedures  MDM Will check CBC/diff to eval for appy, will check UA to eval hydration status >>  Will hydrate with phenergan infusion, discussed with Dr Debroah Loop, doubt appy.    Assessment and Plan  A:  SIUP at [redacted]w[redacted]d      Probable gastroenteritis  P:  Feels better after IV and Phenergan      Will d/c home with supportive care      Return if worsens. [facility] 09/04/2011, 12:52 AM

## 2011-09-04 NOTE — MAU Note (Signed)
Pt states she has felt cramping x 1 hour

## 2011-09-04 NOTE — MAU Note (Signed)
Pt presents to mau for abdominal pain that started about an hour ago.

## 2011-09-04 NOTE — Discharge Instructions (Signed)
Viral Gastroenteritis Viral gastroenteritis is also known as stomach flu. This condition affects the stomach and intestinal tract. It can cause sudden diarrhea and vomiting. The illness typically lasts 3 to 8 days. Most people develop an immune response that eventually gets rid of the virus. While this natural response develops, the virus can make you quite ill. CAUSES  Many different viruses can cause gastroenteritis, such as rotavirus or noroviruses. You can catch one of these viruses by consuming contaminated food or water. You may also catch a virus by sharing utensils or other personal items with an infected person or by touching a contaminated surface. SYMPTOMS  The most common symptoms are diarrhea and vomiting. These problems can cause a severe loss of body fluids (dehydration) and a body salt (electrolyte) imbalance. Other symptoms may include:  Fever.   Headache.   Fatigue.   Abdominal pain.  DIAGNOSIS  Your caregiver can usually diagnose viral gastroenteritis based on your symptoms and a physical exam. A stool sample may also be taken to test for the presence of viruses or other infections. TREATMENT  This illness typically goes away on its own. Treatments are aimed at rehydration. The most serious cases of viral gastroenteritis involve vomiting so severely that you are not able to keep fluids down. In these cases, fluids must be given through an intravenous line (IV). HOME CARE INSTRUCTIONS   Drink enough fluids to keep your urine clear or pale yellow. Drink small amounts of fluids frequently and increase the amounts as tolerated.   Ask your caregiver for specific rehydration instructions.   Avoid:   Foods high in sugar.   Alcohol.   Carbonated drinks.   Tobacco.   Juice.   Caffeine drinks.   Extremely hot or cold fluids.   Fatty, greasy foods.   Too much intake of anything at one time.   Dairy products until 24 to 48 hours after diarrhea stops.   You may  consume probiotics. Probiotics are active cultures of beneficial bacteria. They may lessen the amount and number of diarrheal stools in adults. Probiotics can be found in yogurt with active cultures and in supplements.   Wash your hands well to avoid spreading the virus.   Only take over-the-counter or prescription medicines for pain, discomfort, or fever as directed by your caregiver. Do not give aspirin to children. Antidiarrheal medicines are not recommended.   Ask your caregiver if you should continue to take your regular prescribed and over-the-counter medicines.   Keep all follow-up appointments as directed by your caregiver.  SEEK IMMEDIATE MEDICAL CARE IF:   You are unable to keep fluids down.   You do not urinate at least once every 6 to 8 hours.   You develop shortness of breath.   You notice blood in your stool or vomit. This may look like coffee grounds.   You have abdominal pain that increases or is concentrated in one small area (localized).   You have persistent vomiting or diarrhea.   You have a fever.   The patient is a child younger than 3 months, and he or she has a fever.   The patient is a child older than 3 months, and he or she has a fever and persistent symptoms.   The patient is a child older than 3 months, and he or she has a fever and symptoms suddenly get worse.   The patient is a baby, and he or she has no tears when crying.  MAKE SURE YOU:     Understand these instructions.   Will watch your condition.   Will get help right away if you are not doing well or get worse.  Document Released: 06/04/2005 Document Revised: 05/24/2011 Document Reviewed: 03/21/2011 [employer] Patient Information 2012 [employer]. 

## 2011-09-17 ENCOUNTER — Inpatient Hospital Stay (HOSPITAL_COMMUNITY)
Admission: AD | Admit: 2011-09-17 | Discharge: 2011-09-17 | Disposition: A | Discharge status: Hospice | Payer: Self-pay | Source: Ambulatory Visit | Attending: Obstetrics & Gynecology | Admitting: Obstetrics & Gynecology

## 2011-09-17 ENCOUNTER — Encounter (HOSPITAL_COMMUNITY): Payer: Self-pay | Admitting: *Deleted

## 2011-09-17 ENCOUNTER — Inpatient Hospital Stay (HOSPITAL_COMMUNITY): Payer: Self-pay

## 2011-09-17 DIAGNOSIS — O321XX0 Maternal care for breech presentation, not applicable or unspecified: Secondary | ICD-10-CM | POA: Insufficient documentation

## 2011-09-17 DIAGNOSIS — O209 Hemorrhage in early pregnancy, unspecified: Secondary | ICD-10-CM | POA: Insufficient documentation

## 2011-09-17 DIAGNOSIS — O4690 Antepartum hemorrhage, unspecified, unspecified trimester: Secondary | ICD-10-CM

## 2011-09-17 LAB — WET PREP, GENITAL: Yeast Wet Prep HPF POC: NONE SEEN

## 2011-09-17 LAB — URINALYSIS, ROUTINE W REFLEX MICROSCOPIC
Bilirubin Urine: NEGATIVE
Ketones, ur: NEGATIVE mg/dL
Nitrite: NEGATIVE
Protein, ur: NEGATIVE mg/dL
pH: 7 (ref 5.0–8.0)

## 2011-09-17 NOTE — MAU Provider Note (Signed)
Jenny Myers [id number] @[redacted]w[redacted]d  by LMP Chief Complaint  Patient presents with  . Vaginal Bleeding     First Provider Initiated Contact with Patient 09/17/11 2119      SUBJECTIVE  HPI: Pt presents to MAU with onset of cramping this afternoon followed by dark red spotting.  The bleeding was not heavy enough to require a pad and pt did not see bleeding when she wiped after urinating in the MAU.  She has also had some white discharge with no itching or order throughout the pregnancy.  She reports good fetal movement, denies LOF, dizziness, n/v, urinary symptoms, or fever/chills.  Past Medical History  Diagnosis Date  . No pertinent past medical history   . Ectopic pregnancy 11/2010    Right salpingectomy   Past Surgical History  Procedure Date  . Salpingectomy   . Wisdom tooth extraction    History   Social History  . Marital Status: Single    Spouse Name: N/A    Number of Children: N/A  . Years of Education: N/A   Occupational History  . Not on file.   Social History Main Topics  . Smoking status: Never Smoker   . Smokeless tobacco: Never Used  . Alcohol Use: No     not since pregnancy  . Drug Use: No  . Sexually Active: Yes    Birth Control/ Protection: None   Other Topics Concern  . Not on file   Social History Narrative  . No narrative on file   No current facility-administered medications on file prior to encounter.   Current Outpatient Prescriptions on File Prior to Encounter  Medication Sig Dispense Refill  . acetaminophen (TYLENOL) 325 MG tablet Take 650 mg by mouth every 6 (six) hours as needed. For pain      . Prenatal Vit-Fe Fumarate-FA (PRENATAL MULTIVITAMIN) TABS Take 1 tablet by mouth every morning.        Allergies  Allergen Reactions  . Garlic Anaphylaxis  . Penicillins Anaphylaxis and Nausea And Vomiting  . Carrot Flavor Hives    ROS: Pertinent items in HPI  OBJECTIVE Temperature 97.3 F (36.3 C), resp. rate 18, height 5' (1.524 m),  weight 43.545 kg (96 lb), last menstrual period 05/08/2011. GENERAL: Well-developed, well-nourished female in no acute distress.  LUNGS: Clear to auscultation bilaterally.  HEART: Regular rate and rhythm. ABDOMEN: Soft, nontender EXTREMITIES: Nontender, no edema Pelvic exam: Cervix pink, without lesion, scant white discharge, no vaginal bleeding noted, vaginal walls and external genitalia normal Bimanual exam:  Cervix 0/long/high, posterior, neg CMT, no bleeding noted with exam   LAB RESULTS Results for orders placed during the hospital encounter of 09/17/11 (from the past 24 hour(s))  URINALYSIS, ROUTINE W REFLEX MICROSCOPIC     Status: Normal   Collection Time   09/17/11  8:50 PM      Component Value Range   Color, Urine YELLOW  YELLOW    APPearance CLEAR  CLEAR    Specific Gravity, Urine 1.015  1.005 - 1.030    pH 7.0  5.0 - 8.0    Glucose, UA NEGATIVE  NEGATIVE (mg/dL)   Hgb urine dipstick NEGATIVE  NEGATIVE    Bilirubin Urine NEGATIVE  NEGATIVE    Ketones, ur NEGATIVE  NEGATIVE (mg/dL)   Protein, ur NEGATIVE  NEGATIVE (mg/dL)   Urobilinogen, UA 0.2  0.0 - 1.0 (mg/dL)   Nitrite NEGATIVE  NEGATIVE    Leukocytes, UA NEGATIVE  NEGATIVE   WET PREP, GENITAL  Status: Abnormal   Collection Time   09/17/11  9:40 PM      Component Value Range   Yeast Wet Prep HPF POC NONE SEEN  NONE SEEN    Trich, Wet Prep NONE SEEN  NONE SEEN    Clue Cells Wet Prep HPF POC NONE SEEN  NONE SEEN    WBC, Wet Prep HPF POC FEW (*) NONE SEEN     IMAGING Living [redacted]w[redacted]d IUP in breech presentation noted on U/S.  No evidence of placenta previa, placental abruption, or subchorionic hemorrhage on U/S. See U/S report.  ASSESSMENT Vaginal bleeding in pregnancy  PLAN D/C home with bleeding precautions F/U with prenatal provider Return to MAU as needed

## 2011-09-17 NOTE — MAU Note (Signed)
Lower abd cramping started today @ 1400 and dk red spotting began @ 1800.  deniesany urinary problems.  Has had vag itching for the past couple months.

## 2011-09-17 NOTE — MAU Note (Signed)
Pt presents to mau for c/o bleeding and cramping that started around 2pm,, bleeding followed around 5pm.

## 2011-09-17 NOTE — Discharge Instructions (Signed)
Vaginal Bleeding During Pregnancy, Second Trimester  A small amount of bleeding (spotting) is relatively common in pregnancy. It usually stops on its own. There are many causes for bleeding or spotting in pregnancy. Some bleeding may be related to the pregnancy and some may not. Cramping with the bleeding is more serious and concerning. Tell your caregiver if you have any vaginal bleeding.   CAUSES    Infection, inflammation or growths on the cervix.   The placenta may partially or completely be covering the opening of the cervix inside the uterus.   The placenta may have separated from the uterus.   You may be having early/preterm labor.   The cervix is not strong enough to keep a baby inside the uterus (cervical insufficiency).   Many tiny cysts in the uterus instead of pregnancy tissue (molar pregnancy)  SYMPTOMS    Vaginal spotting or bleeding with or without cramps.   Uterine contractions.   Abnormal vaginal discharge.   You may have spotting or spotting after having sexual intercourse.  DIAGNOSIS   To evaluate the pregnancy, your caregiver may:   Do a pelvic exam.   Take blood tests.   Do an ultrasound.  It is very important to follow your caregiver's instructions.   TREATMENT    Evaluation of the pregnancy with blood tests and ultrasound.   Bed rest (getting up to use the bathroom only).   Rho-gam immunization if the mother is Rh negative and the father is Rh positive.   If you are having uterine contractions, you may be given medication to stop the contractions.   If you have cervical insufficiency, you may have a suture placed in the cervix to close it.  HOME CARE INSTRUCTIONS    If your caregiver orders bed rest, you may need to make arrangements for the care of other children and for any other responsibilities. However, your caregiver may allow you to continue light activity.   Keep track of the number of pads you use each day and how soaked (saturated) they are. Write this down.   Do  not use tampons. Do not douche.   Do not have sexual intercourse or orgasms until approved by your physician.   Save any tissue that you pass for your caregiver to see.   Take medicine for cramps only with your caregiver's permission.   Do not take aspirin because it can make you bleed.   Do not exercise, do any strenuous activities or heavy lifting without your caregiver's permission.  SEEK IMMEDIATE MEDICAL CARE IF:    You experience severe cramps in your stomach, back or belly (abdomen).   You have uterine contractions.   You have an oral temperature above 102 F (38.9 C), not controlled by medicine.   You develop chills.   You pass large clots or tissue.   Your bleeding increases or you become light-headed, weak or have fainting episodes.   You have leaking or a gush of fluid from your vagina.  Document Released: 03/14/2005 Document Revised: 05/24/2011 Document Reviewed: 09/23/2008  [employer] Patient Information 2012 [employer].

## 2011-09-17 NOTE — MAU Provider Note (Signed)
Attestation of Attending Supervision of Advanced Practitioner: Evaluation and management procedures were performed by the PA/NP/CNM/OB Fellow under my supervision/collaboration. Chart reviewed, and agree with management and plan.  Jaynie Collins, M.D. 09/17/2011 11:09 PM

## 2011-10-08 ENCOUNTER — Other Ambulatory Visit (HOSPITAL_COMMUNITY): Payer: Self-pay | Admitting: Physician Assistant

## 2011-10-08 DIAGNOSIS — Z0489 Encounter for examination and observation for other specified reasons: Secondary | ICD-10-CM

## 2011-10-08 LAB — OB RESULTS CONSOLE HIV ANTIBODY (ROUTINE TESTING): HIV: NONREACTIVE

## 2011-10-08 LAB — CBC: Hemoglobin: 11.3 g/dL — AB (ref 12.0–16.0)

## 2011-10-08 LAB — OB RESULTS CONSOLE VARICELLA ZOSTER ANTIBODY, IGG: Varicella: IMMUNE

## 2011-10-08 LAB — OB RESULTS CONSOLE GC/CHLAMYDIA: Gonorrhea: NEGATIVE

## 2011-10-10 ENCOUNTER — Ambulatory Visit (HOSPITAL_COMMUNITY)
Admission: RE | Admit: 2011-10-10 | Discharge: 2011-10-10 | Disposition: A | Discharge status: Home / Self Care | Payer: Medicaid Other | Source: Ambulatory Visit | Attending: Physician Assistant | Admitting: Physician Assistant

## 2011-10-10 DIAGNOSIS — Z363 Encounter for antenatal screening for malformations: Secondary | ICD-10-CM | POA: Insufficient documentation

## 2011-10-10 DIAGNOSIS — O358XX0 Maternal care for other (suspected) fetal abnormality and damage, not applicable or unspecified: Secondary | ICD-10-CM | POA: Insufficient documentation

## 2011-10-10 DIAGNOSIS — Z1389 Encounter for screening for other disorder: Secondary | ICD-10-CM | POA: Insufficient documentation

## 2011-10-10 DIAGNOSIS — Z0489 Encounter for examination and observation for other specified reasons: Secondary | ICD-10-CM

## 2011-10-11 DIAGNOSIS — O09299 Supervision of pregnancy with other poor reproductive or obstetric history, unspecified trimester: Secondary | ICD-10-CM

## 2011-10-11 DIAGNOSIS — Z8759 Personal history of other complications of pregnancy, childbirth and the puerperium: Secondary | ICD-10-CM

## 2011-10-15 ENCOUNTER — Encounter: Payer: Self-pay | Admitting: Family

## 2011-10-18 DIAGNOSIS — Z8759 Personal history of other complications of pregnancy, childbirth and the puerperium: Secondary | ICD-10-CM

## 2011-10-22 ENCOUNTER — Encounter: Payer: Self-pay | Admitting: Advanced Practice Midwife

## 2011-10-22 ENCOUNTER — Encounter: Payer: Self-pay | Admitting: Obstetrics and Gynecology

## 2011-10-29 ENCOUNTER — Encounter: Payer: Self-pay | Admitting: Family Medicine

## 2011-11-01 ENCOUNTER — Other Ambulatory Visit: Payer: Self-pay | Admitting: Nurse Practitioner

## 2011-11-01 DIAGNOSIS — Z3689 Encounter for other specified antenatal screening: Secondary | ICD-10-CM

## 2011-11-09 DIAGNOSIS — Z8759 Personal history of other complications of pregnancy, childbirth and the puerperium: Secondary | ICD-10-CM

## 2011-12-04 ENCOUNTER — Other Ambulatory Visit: Payer: Self-pay | Admitting: Obstetrics

## 2011-12-04 DIAGNOSIS — IMO0002 Reserved for concepts with insufficient information to code with codable children: Secondary | ICD-10-CM

## 2011-12-06 ENCOUNTER — Ambulatory Visit (HOSPITAL_COMMUNITY)
Admission: RE | Admit: 2011-12-06 | Discharge: 2011-12-06 | Disposition: A | Discharge status: Home / Self Care | Payer: Self-pay | Source: Ambulatory Visit | Attending: Obstetrics | Admitting: Obstetrics

## 2011-12-06 DIAGNOSIS — Z3689 Encounter for other specified antenatal screening: Secondary | ICD-10-CM | POA: Insufficient documentation

## 2011-12-06 DIAGNOSIS — IMO0002 Reserved for concepts with insufficient information to code with codable children: Secondary | ICD-10-CM

## 2011-12-07 ENCOUNTER — Inpatient Hospital Stay (HOSPITAL_COMMUNITY)
Admission: AD | Admit: 2011-12-07 | Discharge: 2011-12-07 | Disposition: A | Discharge status: Home / Self Care | Payer: Self-pay | Source: Ambulatory Visit | Attending: Obstetrics | Admitting: Obstetrics

## 2011-12-07 ENCOUNTER — Encounter (HOSPITAL_COMMUNITY): Payer: Self-pay

## 2011-12-07 DIAGNOSIS — O4700 False labor before 37 completed weeks of gestation, unspecified trimester: Secondary | ICD-10-CM | POA: Insufficient documentation

## 2011-12-07 DIAGNOSIS — B3731 Acute candidiasis of vulva and vagina: Secondary | ICD-10-CM | POA: Diagnosis present

## 2011-12-07 DIAGNOSIS — O2340 Unspecified infection of urinary tract in pregnancy, unspecified trimester: Secondary | ICD-10-CM | POA: Diagnosis present

## 2011-12-07 DIAGNOSIS — O2390 Unspecified genitourinary tract infection in pregnancy, unspecified trimester: Secondary | ICD-10-CM | POA: Insufficient documentation

## 2011-12-07 DIAGNOSIS — N390 Urinary tract infection, site not specified: Secondary | ICD-10-CM | POA: Insufficient documentation

## 2011-12-07 DIAGNOSIS — B373 Candidiasis of vulva and vagina: Secondary | ICD-10-CM | POA: Diagnosis present

## 2011-12-07 LAB — FETAL FIBRONECTIN: Fetal Fibronectin: NEGATIVE

## 2011-12-07 LAB — URINE MICROSCOPIC-ADD ON

## 2011-12-07 LAB — URINALYSIS, ROUTINE W REFLEX MICROSCOPIC
Nitrite: NEGATIVE
Specific Gravity, Urine: 1.01 (ref 1.005–1.030)
Urobilinogen, UA: 0.2 mg/dL (ref 0.0–1.0)

## 2011-12-07 MED ORDER — ONDANSETRON 8 MG PO TBDP
8.0000 mg | ORAL_TABLET | Freq: Once | ORAL | Status: AC
  Administered 2011-12-07: 8 mg via ORAL
  Filled 2011-12-07: qty 1

## 2011-12-07 MED ORDER — NITROFURANTOIN MONOHYD MACRO 100 MG PO CAPS: 100.0000 mg | ORAL_CAPSULE | Freq: Two times a day (BID) | ORAL | Status: AC

## 2011-12-07 MED ORDER — FLUCONAZOLE 150 MG PO TABS
150.0000 mg | ORAL_TABLET | ORAL | Status: AC
  Administered 2011-12-07: 150 mg via ORAL
  Filled 2011-12-07: qty 1

## 2011-12-07 NOTE — MAU Note (Signed)
Pt states, " I have been having contractions for an hour and they are getting worse. I have been in once before for preterm labor.

## 2011-12-07 NOTE — MAU Provider Note (Signed)
Carolee Rota [id number] @[redacted]w[redacted]d  by LMP Chief Complaint  Patient presents with  . Contractions     None     SUBJECTIVE  HPI: Pt presents to MAU reporting painful contractions 8-10 times/hour for last few hours.  She is also having some burning with urination.  She was treated for yeast infection ~ 1 month ago with topical medication and denies current discharge or itching.  She reports good fetal movement, denies LOF, vaginal bleeding, vaginal itching/burning, h/a, dizziness, n/v, or fever/chills.  She denies intercourse or anything per vagina in >24 hours.    Past Medical History  Diagnosis Date  . No pertinent past medical history   . Ectopic pregnancy 11/2010    Right salpingectomy   Past Surgical History  Procedure Date  . Salpingectomy 2012    left ovary  . Wisdom tooth extraction    History   Social History  . Marital Status: Single    Spouse Name: N/A    Number of Children: N/A  . Years of Education: N/A   Occupational History  . Not on file.   Social History Main Topics  . Smoking status: Never Smoker   . Smokeless tobacco: Never Used  . Alcohol Use: No     not since pregnancy  . Drug Use: No  . Sexually Active: Yes    Birth Control/ Protection: None   Other Topics Concern  . Not on file   Social History Narrative  . No narrative on file   No current facility-administered medications on file prior to encounter.   Current Outpatient Prescriptions on File Prior to Encounter  Medication Sig Dispense Refill  . acetaminophen (TYLENOL) 325 MG tablet Take 650 mg by mouth every 6 (six) hours as needed. For pain      . Prenatal Vit-Fe Fumarate-FA (PRENATAL MULTIVITAMIN) TABS Take 1 tablet by mouth every morning.        Allergies  Allergen Reactions  . Garlic Anaphylaxis  . Penicillins Anaphylaxis and Nausea And Vomiting  . Carrot Flavor Hives    ROS: Pertinent items in HPI  OBJECTIVE Blood pressure 98/72, pulse 94, temperature 98.7 F (37.1 C),  temperature source Oral, resp. rate 20, height 5' (1.524 m), weight 48.762 kg (107 lb 8 oz), last menstrual period 05/08/2011.  GENERAL: Well-developed, well-nourished female in no acute distress.  HEENT: Normocephalic, good dentition HEART: normal rate RESP: normal effort ABDOMEN: Soft, nontender EXTREMITIES: Nontender, no edema NEURO: Alert and oriented  FHR: baseline 130 with moderate variablity, positive accels, negative decels--Category I tracing Toco: Irritability, lasting 30 seconds, irregular 1-3x/10 minutes  Cervix 0/long/high per RN at 2155, external os FT  Cervix unchanged per Sharen Counter, CNM at 2300  Significant amount of white clumpy discharge noted with cervical exam  LAB RESULTS Results for orders placed during the hospital encounter of 12/07/11 (from the past 24 hour(s))  URINALYSIS, ROUTINE W REFLEX MICROSCOPIC     Status: Abnormal   Collection Time   12/07/11  9:22 PM      Component Value Range   Color, Urine YELLOW  YELLOW   APPearance CLEAR  CLEAR   Specific Gravity, Urine 1.010  1.005 - 1.030   pH 7.0  5.0 - 8.0   Glucose, UA NEGATIVE  NEGATIVE mg/dL   Hgb urine dipstick NEGATIVE  NEGATIVE   Bilirubin Urine NEGATIVE  NEGATIVE   Ketones, ur NEGATIVE  NEGATIVE mg/dL   Protein, ur NEGATIVE  NEGATIVE mg/dL   Urobilinogen, UA 0.2  0.0 - 1.0  mg/dL   Nitrite NEGATIVE  NEGATIVE   Leukocytes, UA SMALL (*) NEGATIVE  URINE MICROSCOPIC-ADD ON     Status: Normal   Collection Time   12/07/11  9:22 PM      Component Value Range   Squamous Epithelial / LPF RARE  RARE   WBC, UA 0-2  <3 WBC/hpf   RBC / HPF 0-2  <3 RBC/hpf  FETAL FIBRONECTIN     Status: Normal   Collection Time   12/07/11  9:55 PM      Component Value Range   Fetal Fibronectin NEGATIVE  NEGATIVE    ASSESSMENT Threatened preterm labor UTI in pregnancy Candida vaginitis  PLAN Diflucan 150 mg PO x1 dose in MAU Urine sent for culture  D/C home with PTL precautions Macrobid 100 mg BID  x7 days Drink plenty of fluids and rest over the weekend F/U with prenatal provider next week Return to MAU as needed  LEFTWICH-KIRBY, Garnie Borchardt 12/07/2011 10:23 PM

## 2011-12-07 NOTE — Discharge Instructions (Signed)
Candida Infection, Adult A candida infection (also called yeast, fungus and Monilia infection) is an overgrowth of yeast that can occur anywhere on the body. A yeast infection commonly occurs in warm, moist body areas. Usually, the infection remains localized but can spread to become a systemic infection. A yeast infection may be a sign of a more severe disease such as diabetes, leukemia, or AIDS. A yeast infection can occur in both men and women. In women, Candida vaginitis is a vaginal infection. It is one of the most common causes of vaginitis. Men usually do not have symptoms or know they have an infection until other problems develop. Men may find out they have a yeast infection because their sex partner has a yeast infection. Uncircumcised men are more likely to get a yeast infection than circumcised men. This is because the uncircumcised glans is not exposed to air and does not remain as dry as that of a circumcised glans. Older adults may develop yeast infections around dentures. CAUSES  Women  Antibiotics.   Steroid medication taken for a long time.   Being overweight (obese).   Diabetes.   Poor immune condition.   Certain serious medical conditions.   Immune suppressive medications for organ transplant patients.   Chemotherapy.   Pregnancy.   Menstration.   Stress and fatigue.   Intravenous drug use.   Oral contraceptives.   Wearing tight-fitting clothes in the crotch area.   Catching it from a sex partner who has a yeast infection.   Spermicide.   Intravenous, urinary, or other catheters.  Men  Catching it from a sex partner who has a yeast infection.   Having oral or anal sex with a person who has the infection.   Spermicide.   Diabetes.   Antibiotics.   Poor immune system.   Medications that suppress the immune system.   Intravenous drug use.   Intravenous, urinary, or other catheters.  SYMPTOMS  Women  Thick, white vaginal discharge.    Vaginal itching.   Redness and swelling in and around the vagina.   Irritation of the lips of the vagina and perineum.   Blisters on the vaginal lips and perineum.   Painful sexual intercourse.   Low blood sugar (hypoglycemia).   Painful urination.   Bladder infections.   Intestinal problems such as constipation, indigestion, bad breath, bloating, increase in gas, diarrhea, or loose stools.  Men  Men may develop intestinal problems such as constipation, indigestion, bad breath, bloating, increase in gas, diarrhea, or loose stools.   Dry, cracked skin on the penis with itching or discomfort.   Jock itch.   Dry, flaky skin.   Athlete's foot.   Hypoglycemia.  DIAGNOSIS  Women  A history and an exam are performed.   The discharge may be examined under a microscope.   A culture may be taken of the discharge.  Men  A history and an exam are performed.   Any discharge from the penis or areas of cracked skin will be looked at under the microscope and cultured.   Stool samples may be cultured.  TREATMENT  Women  Vaginal antifungal suppositories and creams.   Medicated creams to decrease irritation and itching on the outside of the vagina.   Warm compresses to the perineal area to decrease swelling and discomfort.   Oral antifungal medications.   Medicated vaginal suppositories or cream for repeated or recurrent infections.   Wash and dry the irritation areas before applying the cream.     Eating yogurt with lactobacillus may help with prevention and treatment.   Sometimes painting the vagina with gentian violet solution may help if creams and suppositories do not work.  Men  Antifungal creams and oral antifungal medications.   Sometimes treatment must continue for 30 days after the symptoms go away to prevent recurrence.  HOME CARE INSTRUCTIONS  Women  Use cotton underwear and avoid tight-fitting clothing.   Avoid colored, scented toilet paper and  deodorant tampons or pads.   Do not douche.   Keep your diabetes under control.   Finish all the prescribed medications.   Keep your skin clean and dry.   Consume milk or yogurt with lactobacillus active culture regularly. If you get frequent yeast infections and think that is what the infection is, there are over-the-counter medications that you can get. If the infection does not show healing in 3 days, talk to your caregiver.   Tell your sex partner you have a yeast infection. Your partner may need treatment also, especially if your infection does not clear up or recurs.  Men  Keep your skin clean and dry.   Keep your diabetes under control.   Finish all prescribed medications.   Tell your sex partner that you have a yeast infection so they can be treated if necessary.  SEEK MEDICAL CARE IF:   Your symptoms do not clear up or worsen in one week after treatment.   You have an oral temperature above 102 F (38.9 C).   You have trouble swallowing or eating for a prolonged time.   You develop blisters on and around your vagina.   You develop vaginal bleeding and it is not your menstrual period.   You develop abdominal pain.   You develop intestinal problems as mentioned above.   You get weak or lightheaded.   You have painful or increased urination.   You have pain during sexual intercourse.  MAKE SURE YOU:   Understand these instructions.   Will watch your condition.   Will get help right away if you are not doing well or get worse.  Document Released: 07/12/2004 Document Revised: 05/24/2011 Document Reviewed: 10/24/2009 [facility] Patient Information 2012 [place], [place]. Urinary Tract Infection in Pregnancy A urinary tract infection (UTI) is a bacterial infection of the urinary tract. Infection of the urinary tract can include the ureters, kidneys (pyelonephritis), bladder (cystitis), and urethra (urethritis). All pregnant women should be screened for bacteria in  the urinary tract. Identifying and treating a UTI will decrease the risk of preterm labor and developing more serious infections in both the mother and baby. CAUSES Bacteria germs cause almost all UTIs. There are many factors that can increase your chances of getting a UTI during pregnancy. These include:  Having a short urethra.   Poor toilet and hygiene habits.   Sexual intercourse.   Blockage of urine along the urinary tract.   Problems with the pelvic muscles or nerves.   Diabetes.   Obesity.   Bladder problems after having several children.   Previous history of UTI.  SYMPTOMS   Pain, burning, or a stinging feeling when urinating.   Suddenly feeling the need to urinate right away (urgency).   Loss of bladder control (urinary incontinence).   Frequent urination, more than is common with pregnancy.   Lower abdominal or back discomfort.   Bad smelling urine.   Cloudy urine.   Blood in the urine (hematuria).   Fever.  When the kidneys are infected, the symptoms may  be:  Back pain.   Flank pain on the right side more so than the left.   Fever.   Chills.   Nausea.   Vomiting.  DIAGNOSIS   Urine tests.   Additional tests and procedures may include:   Ultrasound of the kidneys, ureters, bladder, and urethra.   Looking in the bladder with a lighted tube (cystoscopy).   Certain X-ray studies only when absolutely necessary.  Finding out the results of your test Ask when your test results will be ready. Make sure you get your test results. TREATMENT  Antibiotic medicine by mouth.   Antibiotics given through the vein (intravenously), if needed.  HOME CARE INSTRUCTIONS   Take your antibiotics as directed. Finish them even if you start to feel better. Only take medicine as directed by your caregiver.   Drink enough fluids to keep your urine clear or pale yellow.   Do not have sexual intercourse until the infection is gone and your caregiver says it  is okay.   Make sure you are tested for UTIs throughout your pregnancy if you get one. These infections often come back.  Preventing a UTI in the future:  Practice good toilet habits. Always wipe from front to back. Use the tissue only once.   Do not hold your urine. Empty your bladder as soon as possible when the urge comes.   Do not douche or use deodorant sprays.   Wash with soap and warm water around the genital area and the anus.   Empty your bladder before and after sexual intercourse.   Wear underwear with a cotton crotch.   Avoid caffeine and carbonated drinks. They can irritate the bladder.   Drink cranberry juice or take cranberry pills. This may decrease the risk of getting a UTI.   Do not drink alcohol.   Keep all your appointments and tests as scheduled.  SEEK MEDICAL CARE IF:   Your symptoms get worse.   You are still having fevers 2 or more days after treatment begins.   You develop a rash.   You feel that you are having problems with medicines prescribed.   You develop abnormal vaginal discharge.  SEEK IMMEDIATE MEDICAL CARE IF:   You develop back or flank pain.   You develop chills.   You have blood in your urine.   You develop nausea and vomiting.   You develop contractions of your uterus.   You have a gush of fluid from the vagina.  MAKE SURE YOU:   Understand these instructions.   Will watch your condition.   Will get help right away if you are not doing well or get worse.  Document Released: 09/29/2010 Document Revised: 05/24/2011 Document Reviewed: 09/29/2010 [facility] Patient Information 2012 [place], [place].  Preterm Labor Preterm labor is when labor starts at less than 37 weeks of pregnancy. The normal length of a pregnancy is 39 to 41 weeks. CAUSES Often, there is no identifiable underlying cause as to why a woman goes into preterm labor. However, one of the most common known causes of preterm labor is infection. Infections of  the uterus, cervix, vagina, amniotic sac, bladder, kidney, or even the lungs (pneumonia) can cause labor to start. Other causes of preterm labor include:  Urogenital infections, such as yeast infections and bacterial vaginosis.   Uterine abnormalities (uterine shape, uterine septum, fibroids, bleeding from the placenta).   A cervix that has been operated on and opens prematurely.   Malformations in the baby.  Multiple gestations (twins, triplets, and so on).   Breakage of the amniotic sac.  Additional risk factors for preterm labor include:  Previous history of preterm labor.   Premature rupture of membranes (PROM).   A placenta that covers the opening of the cervix (placenta previa).   A placenta that separates from the uterus (placenta abruption).   A cervix that is too weak to hold the baby in the uterus (incompetence cervix).   Having too much fluid in the amniotic sac (polyhydramnios).   Taking illegal drugs or smoking while pregnant.   Not gaining enough weight while pregnant.   Women younger than 67 and older than 23 years old.   Low socioeconomic status.   African-American ethnicity.  SYMPTOMS Signs and symptoms of preterm labor include:  Menstrual-like cramps.   Contractions that are 30 to 70 seconds apart, become very regular, closer together, and are more intense and painful.   Contractions that start on the top of the uterus and spread down to the lower abdomen and back.   A sense of increased pelvic pressure or back pain.   A watery or bloody discharge that comes from the vagina.  DIAGNOSIS  A diagnosis can be confirmed by:  A vaginal exam.   An ultrasound of the cervix.   Sampling (swabbing) cervico-vaginal secretions. These samples can be tested for the presence of fetal fibronectin. This is a protein found in cervical discharge which is associated with preterm labor.   Fetal monitoring.  TREATMENT  Depending on the length of the pregnancy  and other circumstances, a caregiver may suggest bed rest. If necessary, there are medicines that can be given to stop contractions and to quicken fetal lung maturity. If labor happens before 34 weeks of pregnancy, a prolonged hospital stay may be recommended. Treatment depends on the condition of both the mother and baby. PREVENTION There are some things a mother can do to lower the risk of preterm labor in future pregnancies. A woman can:   Stop smoking.   Maintain healthy weight gain and avoid chemicals and drugs that are not necessary.   Be watchful for any type of infection.   Inform her caregiver if she has a known history of preterm labor.  Document Released: 08/25/2003 Document Revised: 05/24/2011 Document Reviewed: 09/29/2010 [facility] Patient Information 2012 [place], [place].

## 2011-12-09 LAB — URINE CULTURE

## 2012-01-13 ENCOUNTER — Encounter (HOSPITAL_COMMUNITY): Payer: Self-pay | Admitting: *Deleted

## 2012-01-13 ENCOUNTER — Inpatient Hospital Stay (HOSPITAL_COMMUNITY)
Admission: AD | Admit: 2012-01-13 | Discharge: 2012-01-13 | Disposition: A | Discharge status: Home / Self Care | Payer: Self-pay | Source: Ambulatory Visit | Attending: Obstetrics & Gynecology | Admitting: Obstetrics & Gynecology

## 2012-01-13 DIAGNOSIS — O4700 False labor before 37 completed weeks of gestation, unspecified trimester: Secondary | ICD-10-CM | POA: Insufficient documentation

## 2012-01-13 DIAGNOSIS — O09299 Supervision of pregnancy with other poor reproductive or obstetric history, unspecified trimester: Secondary | ICD-10-CM

## 2012-01-13 DIAGNOSIS — O479 False labor, unspecified: Secondary | ICD-10-CM

## 2012-01-13 LAB — URINALYSIS, ROUTINE W REFLEX MICROSCOPIC
Nitrite: NEGATIVE
Specific Gravity, Urine: 1.015 (ref 1.005–1.030)
Urobilinogen, UA: 1 mg/dL (ref 0.0–1.0)

## 2012-01-13 LAB — URINE MICROSCOPIC-ADD ON

## 2012-01-13 NOTE — MAU Provider Note (Signed)
Chief Complaint:  Rupture of Membranes    First Provider Initiated Contact with Patient 01/13/12 2048      Jenny Myers is  21 y.o. [id number].  Patient's last menstrual period was 05/08/2011..  [redacted]w[redacted]d  She presents complaining of Rupture of Membranes  Reports single episode of leaking fluid that occurred when standing up from toilet after voiding 3 hours ago. States nothing since. Reports irregular contractions in back and right side of abd since yesterday. +FM. Denies blding.  Obstetrical/Gynecological History: OB History    Grav Para Term Preterm Abortions TAB SAB Ect Mult Living   3 1 0 1 1  0 1  1      Past Medical History: Past Medical History  Diagnosis Date  . No pertinent past medical history   . Ectopic pregnancy 11/2010    Right salpingectomy    Past Surgical History: Past Surgical History  Procedure Date  . Salpingectomy 2012    left ovary  . Wisdom tooth extraction     Family History: Family History  Problem Relation Age of Onset  . Hypertension Mother   . Diabetes Father   . Hypertension Father   . Vision loss Paternal Grandmother   . Other Neg Hx     Social History: History  Substance Use Topics  . Smoking status: Never Smoker   . Smokeless tobacco: Never Used  . Alcohol Use: No     not since pregnancy    Allergies:  Allergies  Allergen Reactions  . Garlic Anaphylaxis  . Penicillins Anaphylaxis and Nausea And Vomiting  . Carrot Flavor Hives    Prescriptions prior to admission  Medication Sig Dispense Refill  . acetaminophen (TYLENOL) 325 MG tablet Take 650 mg by mouth every 6 (six) hours as needed. For pain      . Prenatal Vit-Fe Fumarate-FA (PRENATAL MULTIVITAMIN) TABS Take 1 tablet by mouth every morning.         Review of Systems - History obtained from the patient General ROS: negative for - chills or fever Respiratory ROS: no cough, shortness of breath, or wheezing Cardiovascular ROS: no chest pain or dyspnea on  exertion Gastrointestinal ROS: positive for - abdominal pain negative for - change in bowel habits or nausea/vomiting Genito-Urinary ROS: negative for - bleeding, Positive for vag discharge Neurological ROS: negative for - headaches or visual changes  Physical Exam   Blood pressure 114/60, pulse 115, temperature 98.6 F (37 C), temperature source Oral, resp. rate 18, height 5' (1.524 m), weight 114 lb (51.71 kg), last menstrual period 05/08/2011, SpO2 100.00%.  General: General appearance - alert, well appearing, and in no distress and oriented to person, place, and time Mental status - alert, oriented to person, place, and time, normal mood, behavior, speech, dress, motor activity, and thought processes, affect appropriate to mood Abdomen - gravid, non tender Focused Gynecological Exam: VULVA: normal appearing vulva with no masses, tenderness or lesions, VAGINA: normal appearing vagina with normal color and discharge, no lesions, no pooling, neg ferning, CERVIX: closed/thick/post FHR: 125, mod variability, + 15x15 accels, no decels Toco: no contractions, irregular UI   Assessment: False Labor  Plan: Discharge home FU with [facility] as scheduled  Zari Cly E. 01/13/2012,9:06 PM

## 2012-01-13 NOTE — MAU Note (Signed)
Pt reports contractions q 5 minutes. Pt also reports leaking fluid since 6 pm

## 2012-02-01 ENCOUNTER — Encounter (HOSPITAL_COMMUNITY): Payer: Self-pay | Admitting: *Deleted

## 2012-02-01 ENCOUNTER — Observation Stay (HOSPITAL_COMMUNITY)
Admission: AD | Admit: 2012-02-01 | Discharge: 2012-02-01 | DRG: 781 | Disposition: A | Discharge status: Home / Self Care | Payer: MEDICAID | Source: Ambulatory Visit | Attending: Obstetrics & Gynecology | Admitting: Obstetrics & Gynecology

## 2012-02-01 ENCOUNTER — Inpatient Hospital Stay (HOSPITAL_COMMUNITY): Payer: Self-pay

## 2012-02-01 DIAGNOSIS — R109 Unspecified abdominal pain: Secondary | ICD-10-CM | POA: Diagnosis present

## 2012-02-01 DIAGNOSIS — O9A219 Injury, poisoning and certain other consequences of external causes complicating pregnancy, unspecified trimester: Secondary | ICD-10-CM

## 2012-02-01 DIAGNOSIS — O99891 Other specified diseases and conditions complicating pregnancy: Principal | ICD-10-CM | POA: Diagnosis present

## 2012-02-01 DIAGNOSIS — O26899 Other specified pregnancy related conditions, unspecified trimester: Secondary | ICD-10-CM

## 2012-02-01 DIAGNOSIS — Y92009 Unspecified place in unspecified non-institutional (private) residence as the place of occurrence of the external cause: Secondary | ICD-10-CM

## 2012-02-01 DIAGNOSIS — W19XXXA Unspecified fall, initial encounter: Secondary | ICD-10-CM

## 2012-02-01 DIAGNOSIS — O09299 Supervision of pregnancy with other poor reproductive or obstetric history, unspecified trimester: Secondary | ICD-10-CM

## 2012-02-01 DIAGNOSIS — W1809XA Striking against other object with subsequent fall, initial encounter: Secondary | ICD-10-CM | POA: Diagnosis present

## 2012-02-01 MED ORDER — LACTATED RINGERS IV SOLN: INTRAVENOUS | Status: DC

## 2012-02-01 MED ORDER — PRENATAL MULTIVITAMIN CH
1.0000 | ORAL_TABLET | Freq: Every day | ORAL | Status: DC
  Administered 2012-02-01: 1 via ORAL
  Filled 2012-02-01: qty 1

## 2012-02-01 MED ORDER — BUTORPHANOL TARTRATE 1 MG/ML IJ SOLN
2.0000 mg | INTRAMUSCULAR | Status: DC | PRN
  Administered 2012-02-01 (×2): 2 mg via INTRAVENOUS
  Filled 2012-02-01: qty 2
  Filled 2012-02-01 (×2): qty 1

## 2012-02-01 MED ORDER — ZOLPIDEM TARTRATE 5 MG PO TABS: 5.0000 mg | ORAL_TABLET | Freq: Every evening | ORAL | Status: DC | PRN

## 2012-02-01 MED ORDER — LACTATED RINGERS IV SOLN
INTRAVENOUS | Status: DC
  Administered 2012-02-01 (×3): via INTRAVENOUS

## 2012-02-01 MED ORDER — FENTANYL CITRATE 0.05 MG/ML IJ SOLN
100.0000 ug | Freq: Once | INTRAMUSCULAR | Status: AC
  Administered 2012-02-01: 100 ug via INTRAVENOUS
  Filled 2012-02-01: qty 2

## 2012-02-01 MED ORDER — CALCIUM CARBONATE ANTACID 500 MG PO CHEW: 2.0000 | CHEWABLE_TABLET | ORAL | Status: DC | PRN

## 2012-02-01 MED ORDER — DOCUSATE SODIUM 100 MG PO CAPS
100.0000 mg | ORAL_CAPSULE | Freq: Every day | ORAL | Status: DC
  Administered 2012-02-01: 100 mg via ORAL
  Filled 2012-02-01: qty 1

## 2012-02-01 MED ORDER — ACETAMINOPHEN 325 MG PO TABS
650.0000 mg | ORAL_TABLET | ORAL | Status: DC | PRN
  Administered 2012-02-01: 650 mg via ORAL
  Filled 2012-02-01: qty 2

## 2012-02-01 NOTE — Plan of Care (Signed)
Problem: Consults Goal: Birthing Suites Patient Information Press F2 to bring up selections list   Pt 37-[redacted] weeks EGA     

## 2012-02-01 NOTE — MAU Note (Signed)
Pt instructed on NPO status until after US-verbalies understanding-FOB and 23 year old daughter at bedside

## 2012-02-01 NOTE — MAU Note (Signed)
Pt 37.3wks fell on abd around 2300.  Arrived via EMS.

## 2012-02-01 NOTE — H&P (Signed)
Chief Complaint:  Fall    First Provider Initiated Contact with Patient 02/01/12 0059      Jenny Myers is  21 y.o. [id number].  Patient's last menstrual period was 05/08/2011.. [redacted]w[redacted]d  She presents complaining of Fall  Pt reports she fell on abd this evening at 11:30pm while putting her other child to bed. States sudden onset of constant lower abd pain than has worsened since fall. Pt reports good fetal movement since fall. Denies vaginal bleeding or LOF.  Obstetrical/Gynecological History: OB History    Grav Para Term Preterm Abortions TAB SAB Ect Mult Living   3 1 0 1 1  0 1  1      Past Medical History: Past Medical History  Diagnosis Date  . Ectopic pregnancy 11/2010    Right salpingectomy    Past Surgical History: Past Surgical History  Procedure Date  . Salpingectomy 2012    left ovary  . Wisdom tooth extraction     Family History: Family History  Problem Relation Age of Onset  . Hypertension Mother   . Diabetes Father   . Hypertension Father   . Vision loss Paternal Grandmother   . Other Neg Hx     Social History: History  Substance Use Topics  . Smoking status: Never Smoker   . Smokeless tobacco: Never Used  . Alcohol Use: No     not since pregnancy    Allergies:  Allergies  Allergen Reactions  . Garlic Anaphylaxis  . Penicillins Anaphylaxis and Nausea And Vomiting  . Carrot Flavor Hives    Prescriptions prior to admission  Medication Sig Dispense Refill  . acetaminophen (TYLENOL) 325 MG tablet Take 650 mg by mouth every 6 (six) hours as needed. For pain      . Prenatal Vit-Fe Fumarate-FA (PRENATAL MULTIVITAMIN) TABS Take 1 tablet by mouth every morning.         Review of Systems - History obtained from the patient General ROS: negative for - chills or fever Respiratory ROS: no cough, shortness of breath, or wheezing Cardiovascular ROS: no chest pain or dyspnea on exertion Gastrointestinal ROS: positive for - abdominal pain negative for -  nausea/vomiting Genito-Urinary ROS: no dysuria, trouble voiding, or hematuria negative for - vulvar/vaginal symptoms or vaginal bleeding  Physical Exam   Last menstrual period 05/08/2011.  General: General appearance - alert, well appearing, and in no distress and oriented to person, place, and time, uncomfortable appearing Mental status - alert, oriented to person, place, and time, normal mood, behavior, speech, dress, motor activity, and thought processes, teary Abdomen - gravid, diffusely tender, adequate resting tone, no contractions palpated Extremities - peripheral pulses normal, no pedal edema, no clubbing or cyanosis Focused Gynecological Exam: cervix: FT/thick/firm/post FHR: 125, mod variability, + 15x15 accels, no decels Toco: No contractions, occassional UI  Imaging Studies:  Preliminary Report from Tech: no evidence of placental abruption, cephalic presentation, NL AFI  ED Course: Will start IV give pain medication. Bedside US ordered. 2:09 AM Reports pain still present after IV fentanyl. Rates 8/10 down from 9/10.   MD Consult: 2:07 AM Discussed with Dr. Gaynell Face, agrees with plan  Assessment: [redacted]w[redacted]d s/p fall  Abd Pain  Plan: Admit to [facility]te for Obs Continuous Monitoring  Jenny Myers E. 02/01/2012,2:07 AM

## 2012-02-01 NOTE — MAU Provider Note (Signed)
Chief Complaint:  Fall    First Provider Initiated Contact with Patient 02/01/12 0059      Jenny Myers is  21 y.o. [id number].  Patient's last menstrual period was 05/08/2011.. [redacted]w[redacted]d  She presents complaining of Fall  Pt reports she fell on abd this evening at 11:30pm while putting her other child to bed. States sudden onset of constant lower abd pain than has worsened since fall. Pt reports good fetal movement since fall. Denies vaginal bleeding or LOF.  Obstetrical/Gynecological History: OB History    Grav Para Term Preterm Abortions TAB SAB Ect Mult Living   3 1 0 1 1  0 1  1      Past Medical History: Past Medical History  Diagnosis Date  . Ectopic pregnancy 11/2010    Right salpingectomy    Past Surgical History: Past Surgical History  Procedure Date  . Salpingectomy 2012    left ovary  . Wisdom tooth extraction     Family History: Family History  Problem Relation Age of Onset  . Hypertension Mother   . Diabetes Father   . Hypertension Father   . Vision loss Paternal Grandmother   . Other Neg Hx     Social History: History  Substance Use Topics  . Smoking status: Never Smoker   . Smokeless tobacco: Never Used  . Alcohol Use: No     not since pregnancy    Allergies:  Allergies  Allergen Reactions  . Garlic Anaphylaxis  . Penicillins Anaphylaxis and Nausea And Vomiting  . Carrot Flavor Hives    Prescriptions prior to admission  Medication Sig Dispense Refill  . acetaminophen (TYLENOL) 325 MG tablet Take 650 mg by mouth every 6 (six) hours as needed. For pain      . Prenatal Vit-Fe Fumarate-FA (PRENATAL MULTIVITAMIN) TABS Take 1 tablet by mouth every morning.         Review of Systems - History obtained from the patient General ROS: negative for - chills or fever Respiratory ROS: no cough, shortness of breath, or wheezing Cardiovascular ROS: no chest pain or dyspnea on exertion Gastrointestinal ROS: positive for - abdominal pain negative for -  nausea/vomiting Genito-Urinary ROS: no dysuria, trouble voiding, or hematuria negative for - vulvar/vaginal symptoms or vaginal bleeding  Physical Exam   Last menstrual period 05/08/2011.  General: General appearance - alert, well appearing, and in no distress and oriented to person, place, and time, uncomfortable appearing Mental status - alert, oriented to person, place, and time, normal mood, behavior, speech, dress, motor activity, and thought processes, teary Abdomen - gravid, diffusely tender, adequate resting tone, no contractions palpated Extremities - peripheral pulses normal, no pedal edema, no clubbing or cyanosis Focused Gynecological Exam: cervix: FT/thick/firm/post FHR: 125, mod variability, + 15x15 accels, no decels Toco: No contractions, occassional UI  Imaging Studies:  Preliminary Report from Tech: no evidence of placental abruption, cephalic presentation, NL AFI  ED Course: Will start IV give pain medication. Bedside US ordered. 2:09 AM Reports pain still present after IV fentanyl. Rates 8/10 down from 9/10.   MD Consult: 2:07 AM Discussed with Dr. Marshall, agrees with plan  Assessment: [redacted]w[redacted]d s/p fall  Abd Pain  Plan: Admit to [facility]te for Obs Continuous Monitoring  [name] E. 02/01/2012,2:07 AM    

## 2012-02-01 NOTE — Discharge Summary (Signed)
  Physician Discharge Summary  Patient ID: Jenny Myers MRN: [id number] DOB/AGE: 1991-03-22 21 y.o.  Admit date: 02/01/2012 Discharge date: 02/01/2012  Admission Diagnoses:   Active Problems:  Traumatic injury during pregnancy  Discharge Diagnoses:  Active Problems:  Traumatic injury during pregnancy   Discharged Condition: good  Hospital Course: The patient was observed for 12 hours.  Her cramping abated.  She denied vaginal bleeding.  Fetal testing was reassuring.  Consults: None  Significant Diagnostic Studies: radiology: Ultrasound: no evidence of abruption  Treatments: IV hydration  Discharge Exam: Blood pressure 123/59, pulse 98, temperature 97.9 F (36.6 C), temperature source Oral, resp. rate 18, height 5' (1.524 m), weight 53.524 kg (118 lb), last menstrual period 05/08/2011, SpO2 97.00%. General appearance: alert GI: soft, non-tender; bowel sounds normal; no masses,  no organomegaly  Disposition: 01-Home or Self Care  Discharge Orders    Future Orders Please Complete By Expires   LABOR:  When conractions begin, you should start to time them from the beginning of one contraction to the beginning  of the next.  When contractions are 5 - 10 minutes apart or less and have been regular for at least an hour, you should call your health care provider.      Notify physician for bleeding from the vagina      Notify physician for pain or burning when urinating      Notify physician for chills or fever      Notify physician for increase in vaginal discharge      Notify physician for pelvic pressure (sudden increase)      Notify physician if baby moving less than usual      Notify physician for sudden, constant, or occasional abdominal pain      Notify physician for sudden gushing of fluid from the vagina (with or without continued leaking)      Notify physician for leaking of fluid      Notify physician for fainting spells, "black outs" or loss of consciousness      Notify  physician for severe or continued nausea or vomiting      Notify physician for blurring of vision or spots before the eyes      Discharge activity:  No Restrictions      Discharge diet:  No restrictions      No sexual activity restrictions        Medication List  As of 02/01/2012  2:11 PM   TAKE these medications         acetaminophen 325 MG tablet   Commonly known as: TYLENOL   Take 650 mg by mouth every 6 (six) hours as needed. For pain      prenatal multivitamin Tabs   Take 1 tablet by mouth every morning.           Follow-up Information    Please follow up. (Keep previous appointment)          Signed: Roseanna Rainbow 02/01/2012, 2:11 PM

## 2012-02-06 NOTE — Progress Notes (Signed)
Post discharge review completed. 

## 2012-02-12 ENCOUNTER — Encounter (HOSPITAL_COMMUNITY): Payer: Self-pay | Admitting: *Deleted

## 2012-02-12 ENCOUNTER — Inpatient Hospital Stay (HOSPITAL_COMMUNITY)
Admission: AD | Admit: 2012-02-12 | Discharge: 2012-02-13 | Disposition: A | Discharge status: Home / Self Care | Payer: MEDICAID | Source: Ambulatory Visit | Attending: Obstetrics | Admitting: Obstetrics

## 2012-02-12 DIAGNOSIS — O479 False labor, unspecified: Secondary | ICD-10-CM | POA: Insufficient documentation

## 2012-02-12 DIAGNOSIS — O365990 Maternal care for other known or suspected poor fetal growth, unspecified trimester, not applicable or unspecified: Secondary | ICD-10-CM | POA: Insufficient documentation

## 2012-02-12 DIAGNOSIS — O9A219 Injury, poisoning and certain other consequences of external causes complicating pregnancy, unspecified trimester: Secondary | ICD-10-CM

## 2012-02-12 DIAGNOSIS — O09299 Supervision of pregnancy with other poor reproductive or obstetric history, unspecified trimester: Secondary | ICD-10-CM

## 2012-02-12 MED ORDER — ZOLPIDEM TARTRATE 5 MG PO TABS
5.0000 mg | ORAL_TABLET | Freq: Once | ORAL | Status: DC
  Filled 2012-02-12: qty 2

## 2012-02-12 NOTE — MAU Note (Signed)
Pt reports contractions, and gush of fluid at 2100

## 2012-02-13 MED ORDER — ZOLPIDEM TARTRATE 5 MG PO TABS
5.0000 mg | ORAL_TABLET | Freq: Once | ORAL | Status: AC
  Administered 2012-02-13: 10 mg via ORAL

## 2012-02-15 ENCOUNTER — Inpatient Hospital Stay (HOSPITAL_COMMUNITY)
Admission: AD | Admit: 2012-02-15 | Discharge: 2012-02-15 | Disposition: A | Discharge status: Home / Self Care | Payer: Medicaid Other | Source: Ambulatory Visit | Attending: Obstetrics | Admitting: Obstetrics

## 2012-02-15 ENCOUNTER — Encounter (HOSPITAL_COMMUNITY): Payer: Self-pay | Admitting: *Deleted

## 2012-02-15 DIAGNOSIS — O479 False labor, unspecified: Secondary | ICD-10-CM | POA: Insufficient documentation

## 2012-02-15 NOTE — MAU Note (Signed)
UC's regular since 0730. No leaking or bleeding.

## 2012-02-20 ENCOUNTER — Encounter (HOSPITAL_COMMUNITY): Payer: Self-pay | Admitting: *Deleted

## 2012-02-20 ENCOUNTER — Other Ambulatory Visit: Payer: Self-pay | Admitting: Obstetrics & Gynecology

## 2012-02-20 ENCOUNTER — Telehealth (HOSPITAL_COMMUNITY): Payer: Self-pay | Admitting: *Deleted

## 2012-02-20 DIAGNOSIS — O480 Post-term pregnancy: Secondary | ICD-10-CM

## 2012-02-20 NOTE — Telephone Encounter (Signed)
Preadmission screen  

## 2012-02-21 ENCOUNTER — Other Ambulatory Visit: Payer: Self-pay | Admitting: Obstetrics & Gynecology

## 2012-02-22 ENCOUNTER — Ambulatory Visit (HOSPITAL_COMMUNITY)
Admission: RE | Admit: 2012-02-22 | Discharge: 2012-02-22 | Disposition: A | Discharge status: Home / Self Care | Payer: MEDICAID | Source: Ambulatory Visit | Attending: Obstetrics & Gynecology | Admitting: Obstetrics & Gynecology

## 2012-02-22 DIAGNOSIS — O480 Post-term pregnancy: Secondary | ICD-10-CM | POA: Insufficient documentation

## 2012-02-22 DIAGNOSIS — Z3689 Encounter for other specified antenatal screening: Secondary | ICD-10-CM | POA: Insufficient documentation

## 2012-02-26 ENCOUNTER — Inpatient Hospital Stay (HOSPITAL_COMMUNITY)
Admission: AD | Admit: 2012-02-26 | Discharge: 2012-02-26 | Disposition: A | Discharge status: Home / Self Care | Payer: MEDICAID | Source: Ambulatory Visit | Attending: Obstetrics | Admitting: Obstetrics

## 2012-02-26 ENCOUNTER — Encounter (HOSPITAL_COMMUNITY): Payer: Self-pay

## 2012-02-26 DIAGNOSIS — O479 False labor, unspecified: Secondary | ICD-10-CM | POA: Insufficient documentation

## 2012-02-26 DIAGNOSIS — O09299 Supervision of pregnancy with other poor reproductive or obstetric history, unspecified trimester: Secondary | ICD-10-CM

## 2012-02-26 DIAGNOSIS — O9A219 Injury, poisoning and certain other consequences of external causes complicating pregnancy, unspecified trimester: Secondary | ICD-10-CM

## 2012-02-26 NOTE — MAU Note (Signed)
Patient is in for labor eval and light spotting. She states that the ctx were q39m since 1900. Denies lof. Reports good fetal movement. She is for postdate induction in the morning.

## 2012-02-27 ENCOUNTER — Encounter (HOSPITAL_COMMUNITY): Payer: Self-pay | Admitting: Anesthesiology

## 2012-02-27 ENCOUNTER — Inpatient Hospital Stay (HOSPITAL_COMMUNITY)
Admission: AD | Admit: 2012-02-27 | Discharge: 2012-02-28 | DRG: 775 | Disposition: A | Discharge status: Home / Self Care | Payer: MEDICAID | Source: Ambulatory Visit | Attending: Obstetrics & Gynecology | Admitting: Obstetrics & Gynecology

## 2012-02-27 ENCOUNTER — Inpatient Hospital Stay (HOSPITAL_COMMUNITY): Admission: RE | Admit: 2012-02-27 | Payer: MEDICAID | Source: Ambulatory Visit

## 2012-02-27 ENCOUNTER — Inpatient Hospital Stay (HOSPITAL_COMMUNITY): Payer: MEDICAID | Admitting: Anesthesiology

## 2012-02-27 ENCOUNTER — Encounter (HOSPITAL_COMMUNITY): Payer: Self-pay | Admitting: *Deleted

## 2012-02-27 DIAGNOSIS — O09299 Supervision of pregnancy with other poor reproductive or obstetric history, unspecified trimester: Secondary | ICD-10-CM

## 2012-02-27 DIAGNOSIS — O9A219 Injury, poisoning and certain other consequences of external causes complicating pregnancy, unspecified trimester: Secondary | ICD-10-CM

## 2012-02-27 LAB — CBC
HCT: 35.4 % — ABNORMAL LOW (ref 36.0–46.0)
Hemoglobin: 11.4 g/dL — ABNORMAL LOW (ref 12.0–15.0)
MCH: 28.2 pg (ref 26.0–34.0)
MCV: 87.6 fL (ref 78.0–100.0)
RBC: 4.04 MIL/uL (ref 3.87–5.11)

## 2012-02-27 LAB — RPR: RPR Ser Ql: NONREACTIVE

## 2012-02-27 MED ORDER — SENNOSIDES-DOCUSATE SODIUM 8.6-50 MG PO TABS
2.0000 | ORAL_TABLET | Freq: Every day | ORAL | Status: DC
  Administered 2012-02-27: 2 via ORAL

## 2012-02-27 MED ORDER — FENTANYL 2.5 MCG/ML BUPIVACAINE 1/10 % EPIDURAL INFUSION (WH - ANES): 14.0000 mL/h | INTRAMUSCULAR | Status: DC

## 2012-02-27 MED ORDER — WITCH HAZEL-GLYCERIN EX PADS: 1.0000 "application " | MEDICATED_PAD | CUTANEOUS | Status: DC | PRN

## 2012-02-27 MED ORDER — LACTATED RINGERS IV SOLN: INTRAVENOUS | Status: DC

## 2012-02-27 MED ORDER — LIDOCAINE HCL (PF) 1 % IJ SOLN
INTRAMUSCULAR | Status: DC | PRN
  Administered 2012-02-27 (×3): 4 mL

## 2012-02-27 MED ORDER — ACETAMINOPHEN 325 MG PO TABS: 650.0000 mg | ORAL_TABLET | ORAL | Status: DC | PRN

## 2012-02-27 MED ORDER — DIBUCAINE 1 % RE OINT: 1.0000 "application " | TOPICAL_OINTMENT | RECTAL | Status: DC | PRN

## 2012-02-27 MED ORDER — LIDOCAINE HCL (PF) 1 % IJ SOLN
30.0000 mL | INTRAMUSCULAR | Status: DC | PRN
  Filled 2012-02-27: qty 30

## 2012-02-27 MED ORDER — EPHEDRINE 5 MG/ML INJ
INTRAVENOUS | Status: AC
  Filled 2012-02-27: qty 4

## 2012-02-27 MED ORDER — EPHEDRINE 5 MG/ML INJ: 10.0000 mg | INTRAVENOUS | Status: DC | PRN

## 2012-02-27 MED ORDER — FENTANYL 2.5 MCG/ML BUPIVACAINE 1/10 % EPIDURAL INFUSION (WH - ANES)
INTRAMUSCULAR | Status: AC
  Administered 2012-02-27: 14 mL/h via EPIDURAL
  Filled 2012-02-27: qty 60

## 2012-02-27 MED ORDER — ZOLPIDEM TARTRATE 5 MG PO TABS: 5.0000 mg | ORAL_TABLET | Freq: Every evening | ORAL | Status: DC | PRN

## 2012-02-27 MED ORDER — LACTATED RINGERS IV SOLN
500.0000 mL | Freq: Once | INTRAVENOUS | Status: AC
  Administered 2012-02-27: 500 mL via INTRAVENOUS

## 2012-02-27 MED ORDER — IBUPROFEN 600 MG PO TABS
600.0000 mg | ORAL_TABLET | Freq: Four times a day (QID) | ORAL | Status: DC
  Administered 2012-02-27 – 2012-02-28 (×5): 600 mg via ORAL
  Filled 2012-02-27 (×3): qty 1

## 2012-02-27 MED ORDER — FERROUS SULFATE 325 (65 FE) MG PO TABS
325.0000 mg | ORAL_TABLET | Freq: Two times a day (BID) | ORAL | Status: DC
  Administered 2012-02-27 – 2012-02-28 (×3): 325 mg via ORAL
  Filled 2012-02-27 (×3): qty 1

## 2012-02-27 MED ORDER — CITRIC ACID-SODIUM CITRATE 334-500 MG/5ML PO SOLN: 30.0000 mL | ORAL | Status: DC | PRN

## 2012-02-27 MED ORDER — BENZOCAINE-MENTHOL 20-0.5 % EX AERO: 1.0000 "application " | INHALATION_SPRAY | CUTANEOUS | Status: DC | PRN

## 2012-02-27 MED ORDER — OXYCODONE-ACETAMINOPHEN 5-325 MG PO TABS
1.0000 | ORAL_TABLET | ORAL | Status: DC | PRN
  Administered 2012-02-27 – 2012-02-28 (×2): 1 via ORAL
  Filled 2012-02-27 (×2): qty 1

## 2012-02-27 MED ORDER — DIPHENHYDRAMINE HCL 50 MG/ML IJ SOLN: 12.5000 mg | INTRAMUSCULAR | Status: DC | PRN

## 2012-02-27 MED ORDER — LACTATED RINGERS IV SOLN: 500.0000 mL | INTRAVENOUS | Status: DC | PRN

## 2012-02-27 MED ORDER — ONDANSETRON HCL 4 MG PO TABS: 4.0000 mg | ORAL_TABLET | ORAL | Status: DC | PRN

## 2012-02-27 MED ORDER — LANOLIN HYDROUS EX OINT: TOPICAL_OINTMENT | CUTANEOUS | Status: DC | PRN

## 2012-02-27 MED ORDER — OXYCODONE-ACETAMINOPHEN 5-325 MG PO TABS: 1.0000 | ORAL_TABLET | ORAL | Status: DC | PRN

## 2012-02-27 MED ORDER — FLEET ENEMA 7-19 GM/118ML RE ENEM: 1.0000 | ENEMA | RECTAL | Status: DC | PRN

## 2012-02-27 MED ORDER — PHENYLEPHRINE 40 MCG/ML (10ML) SYRINGE FOR IV PUSH (FOR BLOOD PRESSURE SUPPORT)
PREFILLED_SYRINGE | INTRAVENOUS | Status: AC
  Filled 2012-02-27: qty 5

## 2012-02-27 MED ORDER — PHENYLEPHRINE 40 MCG/ML (10ML) SYRINGE FOR IV PUSH (FOR BLOOD PRESSURE SUPPORT): 80.0000 ug | PREFILLED_SYRINGE | INTRAVENOUS | Status: DC | PRN

## 2012-02-27 MED ORDER — ONDANSETRON HCL 4 MG/2ML IJ SOLN: 4.0000 mg | INTRAMUSCULAR | Status: DC | PRN

## 2012-02-27 MED ORDER — OXYTOCIN 40 UNITS IN LACTATED RINGERS INFUSION - SIMPLE MED
62.5000 mL/h | Freq: Once | INTRAVENOUS | Status: DC
  Filled 2012-02-27: qty 1000

## 2012-02-27 MED ORDER — IBUPROFEN 600 MG PO TABS
600.0000 mg | ORAL_TABLET | Freq: Four times a day (QID) | ORAL | Status: DC | PRN
  Administered 2012-02-27: 600 mg via ORAL
  Filled 2012-02-27 (×3): qty 1

## 2012-02-27 MED ORDER — ONDANSETRON HCL 4 MG/2ML IJ SOLN: 4.0000 mg | Freq: Four times a day (QID) | INTRAMUSCULAR | Status: DC | PRN

## 2012-02-27 MED ORDER — PRENATAL MULTIVITAMIN CH
1.0000 | ORAL_TABLET | Freq: Every day | ORAL | Status: DC
  Administered 2012-02-27 – 2012-02-28 (×2): 1 via ORAL
  Filled 2012-02-27 (×2): qty 1

## 2012-02-27 MED ORDER — SIMETHICONE 80 MG PO CHEW: 80.0000 mg | CHEWABLE_TABLET | ORAL | Status: DC | PRN

## 2012-02-27 MED ORDER — DIPHENHYDRAMINE HCL 25 MG PO CAPS: 25.0000 mg | ORAL_CAPSULE | Freq: Four times a day (QID) | ORAL | Status: DC | PRN

## 2012-02-27 MED ORDER — TETANUS-DIPHTH-ACELL PERTUSSIS 5-2.5-18.5 LF-MCG/0.5 IM SUSP: 0.5000 mL | Freq: Once | INTRAMUSCULAR | Status: DC

## 2012-02-27 MED ORDER — OXYTOCIN BOLUS FROM INFUSION
500.0000 mL | Freq: Once | INTRAVENOUS | Status: AC
  Administered 2012-02-27: 500 mL via INTRAVENOUS
  Filled 2012-02-27: qty 500

## 2012-02-27 NOTE — Anesthesia Preprocedure Evaluation (Signed)
Anesthesia Evaluation  Patient identified by MRN, date of birth, ID band Patient awake    Reviewed: Allergy & Precautions, H&P , NPO status , Patient's Chart, lab work & pertinent test results, reviewed documented beta blocker date and time   History of Anesthesia Complications Negative for: history of anesthetic complications  Airway Mallampati: II TM Distance: >3 FB Neck ROM: full    Dental  (+) Teeth Intact   Pulmonary neg pulmonary ROS,  breath sounds clear to auscultation        Cardiovascular negative cardio ROS  Rhythm:regular Rate:Normal     Neuro/Psych negative neurological ROS  negative psych ROS   GI/Hepatic negative GI ROS, Neg liver ROS,   Endo/Other  negative endocrine ROS  Renal/GU negative Renal ROS     Musculoskeletal   Abdominal   Peds  Hematology negative hematology ROS (+)   Anesthesia Other Findings   Reproductive/Obstetrics (+) Pregnancy                           Anesthesia Physical Anesthesia Plan  ASA: II  Anesthesia Plan: Epidural   Post-op Pain Management:    Induction:   Airway Management Planned:   Additional Equipment:   Intra-op Plan:   Post-operative Plan:   Informed Consent: I have reviewed the patients History and Physical, chart, labs and discussed the procedure including the risks, benefits and alternatives for the proposed anesthesia with the patient or authorized representative who has indicated his/her understanding and acceptance.     Plan Discussed with:   Anesthesia Plan Comments:         Anesthesia Quick Evaluation  

## 2012-02-27 NOTE — H&P (Signed)
This is Dr. Francoise Ceo dictating the history and physical on  Azka Oregel she's a 21 year old gravida 4 para 1021 at 41 weeks and 1 day her EDC is 02/19/2012 negative GBS and she was admitted in labor cervix no 7 cm 90% vertex -1 membranes intact Past medical history history of an ectopic pregnancy Past surgical history negative Social history negative System review negative Physical exam revealed a well-developed female in labor HEENT negative Breasts negative Heart regular rhythm no murmurs no gallops Lungs clear to P&A Abdomen term Extremities negative

## 2012-02-27 NOTE — Anesthesia Procedure Notes (Signed)
Epidural Patient location during procedure: OB Start time: 02/27/2012 2:28 AM  Staffing Performed by: anesthesiologist   Preanesthetic Checklist Completed: patient identified, site marked, surgical consent, pre-op evaluation, timeout performed, IV checked, risks and benefits discussed and monitors and equipment checked  Epidural Patient position: sitting Prep: site prepped and draped and DuraPrep Patient monitoring: continuous pulse ox and blood pressure Approach: midline Injection technique: LOR air  Needle:  Needle type: Tuohy  Needle gauge: 17 G Needle length: 9 cm and 9 Needle insertion depth: 4 cm Catheter type: closed end flexible Catheter size: 19 Gauge Catheter at skin depth: 9 cm Test dose: negative  Assessment Events: blood not aspirated, injection not painful, no injection resistance, negative IV test and no paresthesia  Additional Notes Discussed risk of headache, infection, bleeding, nerve injury and failed or incomplete block.  Patient voices understanding and wishes to proceed. Reason for block:procedure for pain

## 2012-02-27 NOTE — Consult Note (Signed)
Neonatology Note:  Attendance at Delivery:  I was asked to attend this NSVD at 41 weeks due to thick meconium. The mother is a G4P1A2 A pos, GBS neg with history of SGA infant with previous pregnancy. ROM 1 hour prior to delivery, fluid clear. Infant vigorous with good spontaneous cry and tone. Needed only bulb suctioning of mouth and nares for small amount of green fluid, non-particulate. Ap 9/9. Lungs clear to ausc in DR. Infant appears perhaps mildly SGA, but vigorous and will be ok for CN. To CN to care of Pediatrician.  Nesanel Aguila, MD  

## 2012-02-27 NOTE — Anesthesia Postprocedure Evaluation (Signed)
  Anesthesia Post-op Note  Patient: Jenny Myers  Procedure(s) Performed: * No procedures listed *  Patient Location: PACU and Mother/Baby  Anesthesia Type: Epidural  Level of Consciousness: awake, alert  and oriented  Airway and Oxygen Therapy: Patient Spontanous Breathing  Post-op Pain: none  Post-op Assessment: Post-op Vital signs reviewed, Patient's Cardiovascular Status Stable, No headache, No backache, No residual numbness and No residual motor weakness  Post-op Vital Signs: Reviewed and stable  Complications: No apparent anesthesia complications

## 2012-02-28 LAB — CBC
HCT: 34 % — ABNORMAL LOW (ref 36.0–46.0)
Hemoglobin: 10.8 g/dL — ABNORMAL LOW (ref 12.0–15.0)
MCHC: 31.8 g/dL (ref 30.0–36.0)
RBC: 3.84 MIL/uL — ABNORMAL LOW (ref 3.87–5.11)
WBC: 9.2 10*3/uL (ref 4.0–10.5)

## 2012-02-28 MED ORDER — PRENATAL MULTIVITAMIN CH: 1.0000 | ORAL_TABLET | Freq: Every morning | ORAL | Status: DC

## 2012-02-28 MED ORDER — OXYCODONE-ACETAMINOPHEN 5-325 MG PO TABS: 1.0000 | ORAL_TABLET | Freq: Four times a day (QID) | ORAL | Status: AC | PRN

## 2012-02-28 MED ORDER — MEDROXYPROGESTERONE ACETATE 150 MG/ML IM SUSP
150.0000 mg | Freq: Once | INTRAMUSCULAR | Status: AC
  Administered 2012-02-28: 150 mg via INTRAMUSCULAR
  Filled 2012-02-28: qty 1

## 2012-02-28 NOTE — Discharge Summary (Signed)
  Obstetric Discharge Summary Reason for Admission: onset of labor Prenatal Procedures: none Intrapartum Procedures: spontaneous vaginal delivery Postpartum Procedures: none Complications-Operative and Postpartum: none  Hemoglobin  Date Value Range Status  02/28/2012 10.8* 12.0 - 15.0 g/dL Final     HCT  Date Value Range Status  02/28/2012 34.0* 36.0 - 46.0 % Final    Physical Exam:  General: alert Lochia: appropriate Uterine: firm Incision: n/a DVT Evaluation: No evidence of DVT seen on physical exam.  Discharge Diagnoses: Active Problems:  Normal delivery   Discharge Information: Date: 02/28/2012 Activity: pelvic rest Diet: routine Medications:  Prior to Admission medications   Medication Sig Start Date End Date Taking? Authorizing Provider  oxyCODONE-acetaminophen (PERCOCET/ROXICET) 5-325 MG per tablet Take 1-2 tablets by mouth every 6 (six) hours as needed (for pain scale > 4). 02/28/12 03/09/12  Antionette Char, MD  Prenatal Vit-Fe Fumarate-FA (PRENATAL MULTIVITAMIN) TABS Take 1 tablet by mouth every morning. 02/28/12   Antionette Char, MD    Condition: stable Instructions: refer to routine discharge instructions Discharge to: home Follow-up Information    Follow up with MARSHALL,BERNARD A, MD. Schedule an appointment as soon as possible for a visit in 6 weeks.   Contact information:   [address] [place] [address] [phone]          Newborn Data: Live born  Information for the patient's newborn:  Ruth, Kovich [[id number]]  female   Home with mother.  JACKSON-MOORE,Ioma Chismar A 02/28/2012, 8:18 AM

## 2012-02-28 NOTE — Progress Notes (Signed)
UR Chart review completed.  

## 2013-04-21 ENCOUNTER — Encounter (HOSPITAL_COMMUNITY): Payer: Self-pay | Admitting: Emergency Medicine

## 2013-04-21 ENCOUNTER — Other Ambulatory Visit: Payer: Self-pay

## 2013-04-21 ENCOUNTER — Emergency Department (HOSPITAL_COMMUNITY)
Admission: EM | Admit: 2013-04-21 | Discharge: 2013-04-21 | Disposition: A | Discharge status: Home / Self Care | Payer: Medicaid Other | Attending: Emergency Medicine | Admitting: Emergency Medicine

## 2013-04-21 ENCOUNTER — Emergency Department (HOSPITAL_COMMUNITY): Payer: Medicaid Other

## 2013-04-21 DIAGNOSIS — R209 Unspecified disturbances of skin sensation: Secondary | ICD-10-CM | POA: Insufficient documentation

## 2013-04-21 DIAGNOSIS — R0789 Other chest pain: Secondary | ICD-10-CM

## 2013-04-21 DIAGNOSIS — R071 Chest pain on breathing: Secondary | ICD-10-CM | POA: Insufficient documentation

## 2013-04-21 DIAGNOSIS — Z880 Allergy status to penicillin: Secondary | ICD-10-CM | POA: Insufficient documentation

## 2013-04-21 LAB — BASIC METABOLIC PANEL
BUN: 14 mg/dL (ref 6–23)
CO2: 22 mEq/L (ref 19–32)
Chloride: 106 mEq/L (ref 96–112)
Creatinine, Ser: 0.66 mg/dL (ref 0.50–1.10)
GFR calc non Af Amer: >90 mL/min (ref >90)
Potassium: 3.5 mEq/L (ref 3.5–5.1)

## 2013-04-21 LAB — CBC
HCT: 41.7 % (ref 36.0–46.0)
Hemoglobin: 14.2 g/dL (ref 12.0–15.0)
MCV: 90.1 fL (ref 78.0–100.0)
RBC: 4.63 MIL/uL (ref 3.87–5.11)
WBC: 7.5 10*3/uL (ref 4.0–10.5)

## 2013-04-21 LAB — POCT I-STAT TROPONIN I

## 2013-04-21 MED ORDER — IBUPROFEN 600 MG PO TABS: 600.0000 mg | ORAL_TABLET | Freq: Four times a day (QID) | ORAL | Status: DC | PRN

## 2013-04-21 MED ORDER — METHOCARBAMOL 750 MG PO TABS: 750.0000 mg | ORAL_TABLET | Freq: Four times a day (QID) | ORAL | Status: DC

## 2013-04-21 NOTE — ED Provider Notes (Signed)
CSN: [id number]     Arrival date & time 04/21/13  1547 History   First MD Initiated Contact with Patient 04/21/13 1748     Chief Complaint  Patient presents with  . Chest Pain   (Consider location/radiation/quality/duration/timing/severity/associated sxs/prior Treatment) Patient is a 22 y.o. female presenting with chest pain. The history is provided by the patient.  Chest Pain  patient here complaining of sudden onset of left sided sharp chest pain worse with movement. Denies any anginal type pain. No dyspnea or diaphoresis. Pain is localized to her left anterior chest. Denies any rashes or trauma history. No prior history of same. No treatment used prior to arrival. Notes symptoms are better when she remains still. Patient does note some left arm tingling but that is also worse with certain positions. Denies any weakness to her left hand.  Past Medical History  Diagnosis Date  . Ectopic pregnancy 11/2010    Right salpingectomy   Past Surgical History  Procedure Laterality Date  . Salpingectomy  2012    left ovary  . Wisdom tooth extraction     Family History  Problem Relation Age of Onset  . Hypertension Mother   . Diabetes Father   . Hypertension Father   . Vision loss Paternal Grandmother   . Other Neg Hx    History  Substance Use Topics  . Smoking status: Never Smoker   . Smokeless tobacco: Never Used  . Alcohol Use: No     Comment: not since pregnancy   OB History   Grav Para Term Preterm Abortions TAB SAB Ect Mult Living   4 2 2  0 2  2 0  2     Review of Systems  Cardiovascular: Positive for chest pain.  All other systems reviewed and are negative.    Allergies  Garlic; Penicillins; and Carrot flavor  Home Medications   Current Outpatient Rx  Name  Route  Sig  Dispense  Refill  . acetaminophen (TYLENOL) 325 MG tablet   Oral   Take 650 mg by mouth every 6 (six) hours as needed.         . Prenatal Vit-Fe Fumarate-FA (PRENATAL MULTIVITAMIN) TABS    Oral   Take 1 tablet by mouth every morning.   60 tablet   5    BP 120/55  Pulse 89  Temp(Src) 98.2 F (36.8 C) (Oral)  Resp 20  Ht 5' (1.524 m)  Wt 100 lb (45.36 kg)  BMI 19.53 kg/m2  SpO2 98%  LMP 04/01/2013  Breastfeeding? No Physical Exam  Nursing note and vitals reviewed. Constitutional: She is oriented to person, place, and time. She appears well-developed and well-nourished.  Non-toxic appearance. No distress.  HENT:  Head: Normocephalic and atraumatic.  Eyes: Conjunctivae, EOM and lids are normal. Pupils are equal, round, and reactive to light.  Neck: Normal range of motion. Neck supple. No tracheal deviation present. No mass present.  Cardiovascular: Normal rate, regular rhythm and normal heart sounds.  Exam reveals no gallop.   No murmur heard. Pulmonary/Chest: Effort normal and breath sounds normal. No stridor. No respiratory distress. She has no decreased breath sounds. She has no wheezes. She has no rhonchi. She has no rales. She exhibits tenderness and bony tenderness. She exhibits no crepitus.    Abdominal: Soft. Normal appearance and bowel sounds are normal. She exhibits no distension. There is no tenderness. There is no rebound and no CVA tenderness.  Musculoskeletal: Normal range of motion. She exhibits no edema and no  tenderness.  Neurological: She is alert and oriented to person, place, and time. She has normal strength. No cranial nerve deficit or sensory deficit. GCS eye subscore is 4. GCS verbal subscore is 5. GCS motor subscore is 6.  Skin: Skin is warm and dry. No abrasion and no rash noted.  Psychiatric: She has a normal mood and affect. Her speech is normal and behavior is normal.    ED Course  Procedures (including critical care time) Labs Review Labs Reviewed  BASIC METABOLIC PANEL - Abnormal; Notable for the following:    Glucose, Bld 103 (*)    All other components within normal limits  CBC  POCT I-STAT TROPONIN I   Imaging Review Dg Chest  2 View  04/21/2013   CLINICAL DATA:  Chest pain left hand tingling  EXAM: CHEST  2 VIEW  COMPARISON:  None.  FINDINGS: Cardiomediastinal silhouette is unremarkable. No acute infiltrate or pleural effusion. No pulmonary edema. Bony thorax is unremarkable. No diagnostic pneumothorax.  IMPRESSION: No active cardiopulmonary disease.   Electronically Signed   By: Natasha Mead M.D.   On: 04/21/2013 16:28    EKG Interpretation   None       MDM  No diagnosis found. Patient will be treated for chest wall pain. Do not think that patient has ACS or PE. Patient stable for discharge      Rate: 98   Rhythm: normal sinus rhythm  QRS Axis: normal  Intervals: normal  ST/T Wave abnormalities: normal  Conduction Disutrbances:none  Narrative Interpretation:   Old EKG Reviewed: none available    Toy Baker, MD 04/21/13 [phone]

## 2013-04-21 NOTE — ED Notes (Signed)
Onset 2-3 hours ago today while walking developed chest pain and shortness of breath.  Pain constant with tingling left upper arm.

## 2013-04-21 NOTE — ED Notes (Signed)
Pt is ambulatory is discharge.

## 2013-04-21 NOTE — ED Notes (Signed)
RN did not see pt prior to discharge. Pt is a x 4. Is sitting on side of bed in NAD. Speech is clear, skin is warm and dry.

## 2013-10-13 ENCOUNTER — Emergency Department (HOSPITAL_COMMUNITY)
Admission: EM | Admit: 2013-10-13 | Discharge: 2013-10-14 | Disposition: A | Discharge status: Home / Self Care | Payer: Self-pay | Attending: Emergency Medicine | Admitting: Emergency Medicine

## 2013-10-13 ENCOUNTER — Encounter (HOSPITAL_COMMUNITY): Payer: Self-pay | Admitting: Emergency Medicine

## 2013-10-13 DIAGNOSIS — N760 Acute vaginitis: Secondary | ICD-10-CM

## 2013-10-13 DIAGNOSIS — O219 Vomiting of pregnancy, unspecified: Secondary | ICD-10-CM | POA: Insufficient documentation

## 2013-10-13 DIAGNOSIS — B9689 Other specified bacterial agents as the cause of diseases classified elsewhere: Secondary | ICD-10-CM

## 2013-10-13 DIAGNOSIS — Z3490 Encounter for supervision of normal pregnancy, unspecified, unspecified trimester: Secondary | ICD-10-CM

## 2013-10-13 DIAGNOSIS — Z880 Allergy status to penicillin: Secondary | ICD-10-CM | POA: Insufficient documentation

## 2013-10-13 DIAGNOSIS — R1033 Periumbilical pain: Secondary | ICD-10-CM | POA: Insufficient documentation

## 2013-10-13 DIAGNOSIS — R112 Nausea with vomiting, unspecified: Secondary | ICD-10-CM

## 2013-10-13 DIAGNOSIS — Z79899 Other long term (current) drug therapy: Secondary | ICD-10-CM | POA: Insufficient documentation

## 2013-10-13 DIAGNOSIS — O2390 Unspecified genitourinary tract infection in pregnancy, unspecified trimester: Secondary | ICD-10-CM | POA: Insufficient documentation

## 2013-10-13 DIAGNOSIS — R197 Diarrhea, unspecified: Secondary | ICD-10-CM | POA: Insufficient documentation

## 2013-10-13 DIAGNOSIS — R109 Unspecified abdominal pain: Secondary | ICD-10-CM

## 2013-10-13 DIAGNOSIS — O0910 Supervision of pregnancy with history of ectopic or molar pregnancy, unspecified trimester: Secondary | ICD-10-CM | POA: Insufficient documentation

## 2013-10-13 DIAGNOSIS — N390 Urinary tract infection, site not specified: Secondary | ICD-10-CM

## 2013-10-13 LAB — CBC
HEMATOCRIT: 39 % (ref 36.0–46.0)
Hemoglobin: 13.1 g/dL (ref 12.0–15.0)
MCH: 29.9 pg (ref 26.0–34.0)
MCHC: 33.6 g/dL (ref 30.0–36.0)
MCV: 89 fL (ref 78.0–100.0)
Platelets: 294 10*3/uL (ref 150–400)
RBC: 4.38 MIL/uL (ref 3.87–5.11)
RDW: 13.3 % (ref 11.5–15.5)
WBC: 8.9 10*3/uL (ref 4.0–10.5)

## 2013-10-13 LAB — BASIC METABOLIC PANEL
BUN: 13 mg/dL (ref 6–23)
CO2: 19 mEq/L (ref 19–32)
Calcium: 9.6 mg/dL (ref 8.4–10.5)
Chloride: 102 mEq/L (ref 96–112)
Creatinine, Ser: 0.45 mg/dL — ABNORMAL LOW (ref 0.50–1.10)
GFR calc Af Amer: >90 mL/min (ref >90)
GFR calc non Af Amer: >90 mL/min (ref >90)
Glucose, Bld: 76 mg/dL (ref 70–99)
Potassium: 3.4 mEq/L — ABNORMAL LOW (ref 3.7–5.3)
Sodium: 137 mEq/L (ref 137–147)

## 2013-10-13 LAB — I-STAT TROPONIN, ED: Troponin i, poc: 0 ng/mL (ref 0.00–0.08)

## 2013-10-13 MED ORDER — ONDANSETRON 4 MG PO TBDP
8.0000 mg | ORAL_TABLET | Freq: Once | ORAL | Status: AC
  Administered 2013-10-13: 8 mg via ORAL
  Filled 2013-10-13: qty 2

## 2013-10-13 NOTE — ED Notes (Signed)
Pt unable to void at this time. 

## 2013-10-13 NOTE — ED Notes (Signed)
Pt. reports mid chest pain onset today , emesis and diarrhea for 2 weeks , denies SOB .

## 2013-10-13 NOTE — ED Provider Notes (Signed)
CSN: [id number]     Arrival date & time 10/13/13  2100 History   First MD Initiated Contact with Patient 10/13/13 2342     Chief Complaint  Patient presents with  . Chest Pain  . Emesis  . Diarrhea     (Consider location/radiation/quality/duration/timing/severity/associated sxs/prior Treatment) HPI  Jenny Myers is a 23 y.o. female complaining of 2 weeks of nausea, vomiting, diarrhea and umbilical pain. Patient also reports a sharp, sternal chest pain starting this a.m. Patient denies fever, cough or shortness of breath. Denies sick contacts, leg, calf swelling or pain. Last menstrual period was early last month. She is G4 P2 with history of left-sided ectopic with salpingectomy (note that chart states right sided but patient is clear that is on the left) patient denies abnormal vaginal discharge, endorses small amount of spotting. Denies lower abdominal pain, dysuria, urinary frequency.   Past Medical History  Diagnosis Date  . Ectopic pregnancy 11/2010    Right salpingectomy   Past Surgical History  Procedure Laterality Date  . Salpingectomy  2012    left ovary  . Wisdom tooth extraction     Family History  Problem Relation Age of Onset  . Hypertension Mother   . Diabetes Father   . Hypertension Father   . Vision loss Paternal Grandmother   . Other Neg Hx    History  Substance Use Topics  . Smoking status: Never Smoker   . Smokeless tobacco: Never Used  . Alcohol Use: No     Comment: not since pregnancy   OB History   Grav Para Term Preterm Abortions TAB SAB Ect Mult Living   4 2 2  0 2  2 0  2     Review of Systems 10 systems reviewed and found to be negative, except as noted in the HPI    Allergies  Garlic; Penicillins; and Carrot flavor  Home Medications   Prior to Admission medications   Medication Sig Start Date End Date Taking? Authorizing Provider  acetaminophen (TYLENOL) 325 MG tablet Take 650 mg by mouth every 6 (six) hours as needed for mild  pain.    Yes Historical Provider, MD  ibuprofen (ADVIL,MOTRIN) 600 MG tablet Take 600 mg by mouth every 6 (six) hours as needed for moderate pain.   Yes Historical Provider, MD  methocarbamol (ROBAXIN-750) 750 MG tablet Take 1 tablet (750 mg total) by mouth 4 (four) times daily. 04/21/13  Yes Toy BakerAnthony T Allen, MD   BP 121/50  Pulse 76  Temp(Src) 98.1 F (36.7 C) (Oral)  Resp 18  SpO2 100%  LMP 09/11/2013 Physical Exam  Nursing note and vitals reviewed. Constitutional: She is oriented to person, place, and time. She appears well-developed and well-nourished. No distress.  HENT:  Head: Normocephalic.  Mouth/Throat: Oropharynx is clear and moist.  Eyes: Conjunctivae and EOM are normal. Pupils are equal, round, and reactive to light.  Cardiovascular: Normal rate, regular rhythm and intact distal pulses.   Pulmonary/Chest: Effort normal and breath sounds normal. No stridor. No respiratory distress. She has no wheezes. She has no rales. She exhibits tenderness.  Midline chest pain is reproducible to palpation  Abdominal: Soft. Bowel sounds are normal. She exhibits no distension and no mass. There is tenderness. There is no rebound and no guarding.  Mild periumbilical tenderness to palpation with no guarding or rebound.  Genitourinary:  Pelvic exam chaperoned by technician: No rashes or lesions, scant white, non-foul-smelling vaginal discharge. No cervical motion or adnexal tenderness.  Musculoskeletal: Normal  range of motion.  No calf asymmetry, superficial collaterals, palpable cords, edema, Homans sign negative bilaterally.    Neurological: She is alert and oriented to person, place, and time.  Skin:     Psychiatric: She has a normal mood and affect.    ED Course  Procedures (including critical care time) Labs Review Labs Reviewed  BASIC METABOLIC PANEL - Abnormal; Notable for the following:    Potassium 3.4 (*)    Creatinine, Ser 0.45 (*)    All other components within normal  limits  POC URINE PREG, ED - Abnormal; Notable for the following:    Preg Test, Ur POSITIVE (*)    All other components within normal limits  GC/CHLAMYDIA PROBE AMP  WET PREP, GENITAL  CBC  HEPATIC FUNCTION PANEL  LIPASE, BLOOD  URINALYSIS, ROUTINE W REFLEX MICROSCOPIC  HCG, QUANTITATIVE, PREGNANCY  I-STAT TROPOININ, ED  TYPE AND SCREEN    Imaging Review No results found.   EKG Interpretation   Date/Time:  Tuesday October 13 2013 21:08:25 EDT Ventricular Rate:  88 PR Interval:  132 QRS Duration: 80 QT Interval:  350 QTC Calculation: 423 R Axis:   87 Text Interpretation:  Sinus rhythm with marked sinus arrhythmia Septal  infarct , age undetermined Abnormal ECG Confirmed by Bebe ShaggyWICKLINE  MD, DONALD  [phone]([id number]) on 10/13/2013 11:53:17 PM      MDM   Final diagnoses:  Nausea vomiting and diarrhea  Abdominal pain   Filed Vitals:   10/13/13 2111 10/13/13 2303 10/13/13 2347 10/14/13 0121  BP: 129/95 115/75 121/50 115/54  Pulse: 95 76 76 87  Temp: 98.4 F (36.9 C) 99.3 F (37.4 C) 98.1 F (36.7 C)   TempSrc: Oral Oral Oral   Resp: 18 18 18 16   SpO2: 98% 100% 100% 99%    Jenny Myers is a 23 y.o. female complaining of nausea vomiting and diarrhea with periumbilical abdominal pain for 2 weeks. Patient has reproducible sternal chest pain starting today. No signs of pneumonia, PE, DVT. Patient is tolerating by mouth in the ED. Blood work with no significant abnormalities. Urine pregnancy is positive. Pelvic exam is not consistent with PID. Patient denies any abnormal vaginal discharge but endorses a scant spotting. She is a history of left-sided ectopic with salpingectomy. Plan is to followup UA, type and screen, pelvic ultrasound. Case signed out to Dr. Bebe ShaggyWickline at shift change.    Jenny Emeryicole Idalia Allbritton, PA-C 10/14/13 [phone]

## 2013-10-14 ENCOUNTER — Emergency Department (HOSPITAL_COMMUNITY): Payer: Medicaid Other

## 2013-10-14 LAB — URINE MICROSCOPIC-ADD ON

## 2013-10-14 LAB — HEPATIC FUNCTION PANEL
ALK PHOS: 40 U/L (ref 39–117)
ALT: 12 U/L (ref 0–35)
AST: 20 U/L (ref 0–37)
Albumin: 4.2 g/dL (ref 3.5–5.2)
BILIRUBIN TOTAL: 0.7 mg/dL (ref 0.3–1.2)
Total Protein: 7.4 g/dL (ref 6.0–8.3)

## 2013-10-14 LAB — GC/CHLAMYDIA PROBE AMP
CT Probe RNA: NEGATIVE
GC PROBE AMP APTIMA: NEGATIVE

## 2013-10-14 LAB — HCG, QUANTITATIVE, PREGNANCY: hCG, Beta Chain, Quant, S: 66781 m[IU]/mL — ABNORMAL HIGH (ref <5)

## 2013-10-14 LAB — WET PREP, GENITAL
TRICH WET PREP: NONE SEEN
YEAST WET PREP: NONE SEEN

## 2013-10-14 LAB — URINALYSIS, ROUTINE W REFLEX MICROSCOPIC
Bilirubin Urine: NEGATIVE
GLUCOSE, UA: NEGATIVE mg/dL
Nitrite: NEGATIVE
Protein, ur: 30 mg/dL — AB
Specific Gravity, Urine: 1.034 — ABNORMAL HIGH (ref 1.005–1.030)
Urobilinogen, UA: 1 mg/dL (ref 0.0–1.0)
pH: 6 (ref 5.0–8.0)

## 2013-10-14 LAB — TYPE AND SCREEN
ABO/RH(D): A POS
Antibody Screen: NEGATIVE

## 2013-10-14 LAB — POC URINE PREG, ED: Preg Test, Ur: POSITIVE — AB

## 2013-10-14 LAB — LIPASE, BLOOD: Lipase: 21 U/L (ref 11–59)

## 2013-10-14 MED ORDER — SODIUM CHLORIDE 0.9 % IV BOLUS (SEPSIS)
1000.0000 mL | Freq: Once | INTRAVENOUS | Status: AC
  Administered 2013-10-14: 1000 mL via INTRAVENOUS

## 2013-10-14 MED ORDER — ONDANSETRON HCL 4 MG PO TABS: 4.0000 mg | ORAL_TABLET | Freq: Three times a day (TID) | ORAL | Status: DC | PRN

## 2013-10-14 MED ORDER — FAMOTIDINE 20 MG PO TABS
20.0000 mg | ORAL_TABLET | Freq: Once | ORAL | Status: AC
  Administered 2013-10-14: 20 mg via ORAL
  Filled 2013-10-14: qty 1

## 2013-10-14 MED ORDER — METRONIDAZOLE 500 MG PO TABS: 500.0000 mg | ORAL_TABLET | Freq: Two times a day (BID) | ORAL | Status: DC

## 2013-10-14 MED ORDER — NITROFURANTOIN MONOHYD MACRO 100 MG PO CAPS: 100.0000 mg | ORAL_CAPSULE | Freq: Two times a day (BID) | ORAL | Status: DC

## 2013-10-14 MED ORDER — MORPHINE SULFATE 4 MG/ML IJ SOLN
4.0000 mg | Freq: Once | INTRAMUSCULAR | Status: AC
  Administered 2013-10-14: 4 mg via INTRAVENOUS
  Filled 2013-10-14: qty 1

## 2013-10-14 MED ORDER — PRENATAL COMPLETE 14-0.4 MG PO TABS: 1.0000 | ORAL_TABLET | Freq: Every day | ORAL | Status: DC

## 2013-10-14 MED ORDER — FAMOTIDINE 40 MG PO TABS: 20.0000 mg | ORAL_TABLET | Freq: Every day | ORAL | Status: DC

## 2013-10-14 NOTE — ED Notes (Signed)
Patient reminded that urine sample is needed.  

## 2013-10-14 NOTE — ED Notes (Signed)
RN made aware of POSITIVE poc pregnancy test.

## 2013-10-14 NOTE — ED Notes (Signed)
Pt stated that she feels better and understand medication teaching with a verbalization of understanding

## 2013-10-14 NOTE — ED Notes (Signed)
Patient given oral fluids @ [phone]

## 2013-10-14 NOTE — Discharge Instructions (Signed)
Follow with OB/GYN as soon as possible. Do NOT take any NSAIDs, such as Aspirin Motrin ibuprofen Aleve naproxen etc. Only take Tylenol for pain. [facility] any severe abdominal pain, increasing vaginal bleeding, passing out, or repeated vomiting.   Obtain over-the-counter prenatal vitamins. Read the label and make sure that they have at least 400 mcg of folate acid.   Please contact your primary care doctor and let them know that you were seen in the emergency room. They must obtain records for evaluation and further management.   Follow with your primary care doctor for a check up in the next 24-48 hours.  Return to the emergency room IMMEDIATELY if you have any NEW or WORSENING symptoms.  Abdominal Pain During Pregnancy Abdominal pain is common in pregnancy. Most of the time, it does not cause harm. There are many causes of abdominal pain. Some causes are more serious than others. Some of the causes of abdominal pain in pregnancy are easily diagnosed. Occasionally, the diagnosis takes time to understand. Other times, the cause is not determined. Abdominal pain can be a sign that something is very wrong with the pregnancy, or the pain may have nothing to do with the pregnancy at all. For this reason, always tell your health care provider if you have any abdominal discomfort. HOME CARE INSTRUCTIONS  Monitor your abdominal pain for any changes. The following actions may help to alleviate any discomfort you are experiencing:  Do not have sexual intercourse or put anything in your vagina until your symptoms go away completely.  Get plenty of rest until your pain improves.  Drink clear fluids if you feel nauseous. Avoid solid food as long as you are uncomfortable or nauseous.  Only take over-the-counter or prescription medicine as directed by your health care provider.  Keep all follow-up appointments with your health care provider. SEEK IMMEDIATE MEDICAL  CARE IF:  You are bleeding, leaking fluid, or passing tissue from the vagina.  You have increasing pain or cramping.  You have persistent vomiting.  You have painful or bloody urination.  You have a fever.  You notice a decrease in your baby's movements.  You have extreme weakness or feel faint.  You have shortness of breath, with or without abdominal pain.  You develop a severe headache with abdominal pain.  You have abnormal vaginal discharge with abdominal pain.  You have persistent diarrhea.  You have abdominal pain that continues even after rest, or gets worse. MAKE SURE YOU:   Understand these instructions.  Will watch your condition.  Will get help right away if you are not doing well or get worse. Document Released: 06/04/2005 Document Revised: 03/25/2013 Document Reviewed: 01/01/2013 [facility] Patient Information 2014 [place], [place].    Abdominal Pain During Pregnancy Abdominal pain is common in pregnancy. Most of the time, it does not cause harm. There are many causes of abdominal pain. Some causes are more serious than others. Some of the causes of abdominal pain in pregnancy are easily diagnosed. Occasionally, the diagnosis takes time to understand. Other times, the cause is not determined. Abdominal pain can be a sign that something is very wrong with the pregnancy, or the pain may have nothing to do with the pregnancy at all. For this reason, always tell your health care provider if you have any abdominal discomfort. HOME CARE INSTRUCTIONS  Monitor your abdominal pain for any changes. The following actions may help to alleviate any discomfort you are experiencing:  Do not have sexual intercourse or put anything in your vagina until your symptoms go away completely.  Get plenty of rest until your pain improves.  Drink clear fluids if you feel nauseous. Avoid solid food as long as you are uncomfortable or nauseous.  Only take over-the-counter or  prescription medicine as directed by your health care provider.  Keep all follow-up appointments with your health care provider. SEEK IMMEDIATE MEDICAL CARE IF:  You are bleeding, leaking fluid, or passing tissue from the vagina.  You have increasing pain or cramping.  You have persistent vomiting.  You have painful or bloody urination.  You have a fever.  You notice a decrease in your baby's movements.  You have extreme weakness or feel faint.  You have shortness of breath, with or without abdominal pain.  You develop a severe headache with abdominal pain.  You have abnormal vaginal discharge with abdominal pain.  You have persistent diarrhea.  You have abdominal pain that continues even after rest, or gets worse. MAKE SURE YOU:   Understand these instructions.  Will watch your condition.  Will get help right away if you are not doing well or get worse. Document Released: 06/04/2005 Document Revised: 03/25/2013 Document Reviewed: 01/01/2013 [facility] Patient Information 2014 [place]ExitCare, [place].

## 2013-10-14 NOTE — ED Provider Notes (Signed)
Medical screening examination/treatment/procedure(s) were conducted as a shared visit with non-physician practitioner(s) and myself.  I personally evaluated the patient during the encounter.   EKG Interpretation   Date/Time:  Tuesday October 13 2013 21:08:25 EDT Ventricular Rate:  88 PR Interval:  132 QRS Duration: 80 QT Interval:  350 QTC Calculation: 423 R Axis:   87 Text Interpretation:  Sinus rhythm with marked sinus arrhythmia Septal  infarct , age undetermined Abnormal ECG Confirmed by Bebe ShaggyWICKLINE  MD, Aviya Jarvie  [phone]([id number]) on 10/13/2013 11:53:17 PM       +IUP noted Will treat for UTI with macrobid (PCN allergy) Also noted to have BV will treat with flagyll Will start PN vitamins Advised need for f/u with OBGYN  Joya Gaskinsonald W Rilie Glanz, MD 10/14/13 0700

## 2013-10-14 NOTE — ED Provider Notes (Signed)
Patient seen/examined in the Emergency Department in conjunction with Midlevel Provider [place] Patient reports chest pain from vomiting, abd pain, and also diarrhea Exam : awake/alert, no distress, abd soft and there is no signs of incarcerated hernia Plan: stable for d/c home after labs resulted  BP 121/50  Pulse 76  Temp(Src) 98.1 F (36.7 C) (Oral)  Resp 18  SpO2 100%  LMP 09/11/2013   EKG Interpretation  Date/Time:  Tuesday October 13 2013 21:08:25 EDT Ventricular Rate:  88 PR Interval:  132 QRS Duration: 80 QT Interval:  350 QTC Calculation: 423 R Axis:   87 Text Interpretation:  Sinus rhythm with marked sinus arrhythmia Septal infarct , age undetermined Abnormal ECG Confirmed by Bebe ShaggyWICKLINE  MD, Deandre Stansel [phone]([id number]) on 10/13/2013 11:53:17 PM         Joya Gaskinsonald W Merl Guardino, MD 10/14/13 [phone]

## 2013-10-15 LAB — URINE CULTURE: Colony Count: >=100000

## 2013-11-19 ENCOUNTER — Encounter (HOSPITAL_COMMUNITY): Payer: Self-pay

## 2013-11-19 ENCOUNTER — Inpatient Hospital Stay (HOSPITAL_COMMUNITY)
Admission: AD | Admit: 2013-11-19 | Discharge: 2013-11-19 | Disposition: A | Discharge status: Home / Self Care | Payer: Medicaid Other | Source: Ambulatory Visit | Attending: Obstetrics and Gynecology | Admitting: Obstetrics and Gynecology

## 2013-11-19 ENCOUNTER — Inpatient Hospital Stay (HOSPITAL_COMMUNITY): Payer: Medicaid Other

## 2013-11-19 DIAGNOSIS — O210 Mild hyperemesis gravidarum: Secondary | ICD-10-CM | POA: Insufficient documentation

## 2013-11-19 DIAGNOSIS — O99891 Other specified diseases and conditions complicating pregnancy: Secondary | ICD-10-CM | POA: Insufficient documentation

## 2013-11-19 DIAGNOSIS — R109 Unspecified abdominal pain: Secondary | ICD-10-CM | POA: Insufficient documentation

## 2013-11-19 DIAGNOSIS — O9A212 Injury, poisoning and certain other consequences of external causes complicating pregnancy, second trimester: Secondary | ICD-10-CM

## 2013-11-19 DIAGNOSIS — W108XXA Fall (on) (from) other stairs and steps, initial encounter: Secondary | ICD-10-CM | POA: Insufficient documentation

## 2013-11-19 DIAGNOSIS — Y929 Unspecified place or not applicable: Secondary | ICD-10-CM | POA: Insufficient documentation

## 2013-11-19 DIAGNOSIS — O9989 Other specified diseases and conditions complicating pregnancy, childbirth and the puerperium: Principal | ICD-10-CM

## 2013-11-19 HISTORY — DX: Umbilical hernia without obstruction or gangrene: K42.9

## 2013-11-19 LAB — URINE MICROSCOPIC-ADD ON

## 2013-11-19 LAB — URINALYSIS, ROUTINE W REFLEX MICROSCOPIC
Bilirubin Urine: NEGATIVE
GLUCOSE, UA: NEGATIVE mg/dL
Ketones, ur: NEGATIVE mg/dL
LEUKOCYTES UA: NEGATIVE
Nitrite: NEGATIVE
Protein, ur: NEGATIVE mg/dL
Specific Gravity, Urine: 1.02 (ref 1.005–1.030)
UROBILINOGEN UA: 1 mg/dL (ref 0.0–1.0)
pH: 6 (ref 5.0–8.0)

## 2013-11-19 MED ORDER — CYCLOBENZAPRINE HCL 5 MG PO TABS: 5.0000 mg | ORAL_TABLET | Freq: Three times a day (TID) | ORAL | Status: DC | PRN

## 2013-11-19 MED ORDER — PROMETHAZINE HCL 12.5 MG PO TABS: 12.5000 mg | ORAL_TABLET | Freq: Four times a day (QID) | ORAL | Status: DC | PRN

## 2013-11-19 NOTE — Discharge Instructions (Signed)
Injuries in Pregnancy °Injuries can happen during pregnancy. Minor falls and accidents usually do not harm the mother or unborn baby. However, any injury should be reported to your doctor. °HOME CARE °· Do not take aspirin. It can make any bleeding you may have worse. °· Put ice on the injured area. °· Put ice in a plastic bag. °· Place a towel between your skin and the bag. °· Leave the ice on for 15-20 minutes, 03-04 times a day. °· Put warm packs on the injured area after 24 hours if told by your doctor. °· Have someone care for you and help you if needed. °· Do not wear high heels while pregnant. °· Remove rugs and loose objects on the floor. °· Avoid fire or starting fires. °· Avoid lifting heavy pots of boiling liquid. °GET HELP RIGHT AWAY IF: °· You have been a victim of domestic violence. °· You have been in a car accident. °· You have more pain in any part of the body. °· You have bleeding from your vagina. °· Fluid is leaking from your vagina. °· You start to have belly cramping (contractions) or pain. °· You have a stiff neck or neck pain. °· You feel weak or pass out (faint). °· You start to throw up (vomit) after an injury. °· You have been burned. °· You get a headache or have vision problems after an injury. °· You do not feel the baby move or the baby is not moving as much as normal. °MAKE SURE YOU: °· Understand these instructions. °· Will watch your condition. °· Will get help right away if you are not doing well or get worse. °Document Released: 07/07/2010 Document Revised: 08/27/2011 Document Reviewed: 03/11/2013 °ExitCare® Patient Information ©2014 [employer]. ° °

## 2013-11-19 NOTE — MAU Provider Note (Signed)
Informed patient of abnormal ultrasound findings and need for follow up with MFM. Patient verbalized understanding and all questions were answered  Attestation of Attending Supervision of Advanced Practitioner (CNM/NP): Evaluation and management procedures were performed by the Advanced Practitioner under my supervision and collaboration.  I have reviewed the Advanced Practitioner's note and chart, and I agree with the management and plan.  Dima Mini 11/19/2013 8:41 PM

## 2013-11-19 NOTE — MAU Provider Note (Signed)
History     CSN: [id number]  Arrival date and time: 11/19/13 1051   First Provider Initiated Contact with Patient 11/19/13 1138      Chief Complaint  Patient presents with  . Fall  . Abdominal Pain   HPI Jenny Myers is a 23 y.o. [id number] at 9513w3d who fell last night on the stairs. She hit the left side of her abdomen on the railing and on the steps. Denies hitting/hurting another other part and her head. Denies bleeding and leaking. Abdominal pain is cramping mainly in the LLQ rated 9/10 started this morning. Has had nausea and vomiting since last night, unable to keep fluids down. Has an appointment for first [facility].  OB History   Grav Para Term Preterm Abortions TAB SAB Ect Mult Living   5 2 2  0 2 0 1 1 0 2      Past Medical History  Diagnosis Date  . Ectopic pregnancy 11/2010    Right salpingectomy  . Umbilical hernia     Past Surgical History  Procedure Laterality Date  . Salpingectomy  2012    left ovary  . Wisdom tooth extraction      Family History  Problem Relation Age of Onset  . Hypertension Mother   . Diabetes Father   . Hypertension Father   . Vision loss Paternal Grandmother   . Other Neg Hx     History  Substance Use Topics  . Smoking status: Never Smoker   . Smokeless tobacco: Never Used  . Alcohol Use: No     Comment: not since pregnancy    Allergies:  Allergies  Allergen Reactions  . Garlic Anaphylaxis  . Penicillins Anaphylaxis and Nausea And Vomiting  . Carrot Flavor Hives    Prescriptions prior to admission  Medication Sig Dispense Refill  . acetaminophen (TYLENOL) 325 MG tablet Take 650 mg by mouth every 6 (six) hours as needed for mild pain.       . Prenatal Vit-Fe Fumarate-FA (PRENATAL MULTIVITAMIN) TABS tablet Take 1 tablet by mouth daily at 12 noon.        Review of Systems  Constitutional: Negative for fever and malaise/fatigue.  HENT: Negative.   Eyes: Negative.    Respiratory: Negative.   Cardiovascular: Negative.   Gastrointestinal: Positive for nausea, vomiting and abdominal pain.  Genitourinary: Negative for dysuria, urgency, frequency and hematuria.  Musculoskeletal: Positive for falls.  Neurological: Negative.   Psychiatric/Behavioral: Negative.    Physical Exam   Blood pressure 115/55, pulse 79, temperature 99.1 F (37.3 C), temperature source Oral, resp. rate 16, height 4' 11.5" (1.511 m), weight 42.547 kg (93 lb 12.8 oz), last menstrual period 09/11/2013, SpO2 100.00%, not currently breastfeeding.  Physical Exam  Nursing note and vitals reviewed. Constitutional: She is oriented to person, place, and time. She appears well-developed and well-nourished. No distress.  HENT:  Head: Normocephalic and atraumatic.  Neck: Normal range of motion.  Cardiovascular: Normal rate, regular rhythm and normal heart sounds.   Respiratory: Effort normal and breath sounds normal.  GI: Soft. There is tenderness (marked tenderness with palpation to left side lateral to umbilicus and LLQ).  Musculoskeletal: Normal range of motion.  Neurological: She is alert and oriented to person, place, and time.  Skin: Skin is warm and dry.  Psychiatric: She has a normal mood and affect. Her behavior is normal. Judgment and thought content normal.   Results for orders placed during the hospital encounter of  11/19/13 (from the past 48 hour(s))  URINALYSIS, ROUTINE W REFLEX MICROSCOPIC     Status: Abnormal   Collection Time    11/19/13 11:15 AM      Result Value Ref Range   Color, Urine YELLOW  YELLOW   APPearance CLEAR  CLEAR   Specific Gravity, Urine 1.020  1.005 - 1.030   pH 6.0  5.0 - 8.0   Glucose, UA NEGATIVE  NEGATIVE mg/dL   Hgb urine dipstick TRACE (*) NEGATIVE   Bilirubin Urine NEGATIVE  NEGATIVE   Ketones, ur NEGATIVE  NEGATIVE mg/dL   Protein, ur NEGATIVE  NEGATIVE mg/dL   Urobilinogen, UA 1.0  0.0 - 1.0 mg/dL   Nitrite NEGATIVE  NEGATIVE    Leukocytes, UA NEGATIVE  NEGATIVE  URINE MICROSCOPIC-ADD ON     Status: None   Collection Time    11/19/13 11:15 AM      Result Value Ref Range   Squamous Epithelial / LPF RARE  RARE   WBC, UA 0-2  <3 WBC/hpf   RBC / HPF 0-2  <3 RBC/hpf   Bacteria, UA RARE  RARE   US Ob Comp Less 14 Wks  11/19/2013   CLINICAL DATA:  23 year old pregnant female with abdominal and pelvic pain following fall. Assigned gestational age of [redacted] weeks 3 days.  EXAM: OBSTETRIC <14 WK ULTRASOUND  TECHNIQUE: Transabdominal ultrasound was performed for evaluation of the gestation as well as the maternal uterus and adnexal regions.  COMPARISON:  10/14/2013  FINDINGS: Intrauterine gestational sac: Visualized/normal in shape.  Yolk sac:  Not visualized  Embryo: Visualized. A cystic hygroma is noted with scalp edema and abdominal fluid. .  Cardiac Activity: Visualized  Heart Rate: 165 bpm  CRL:   60.4  mm   12 w 4 d  Maternal uterus/adnexae: There is no evidence of subchorionic hemorrhage.  Ovaries are not visualized.  There is no evidence of free fluid or adnexal mass.  IMPRESSION: Single living intrauterine gestation with cystic hygroma, scalp edema and abdominal fluid. Maternal-fetal medicine consultation is recommended for further evaluation/ karyotyping. Assigned gestational age of [redacted] weeks 3 days. Estimated gestational age by this ultrasound is 12 weeks 4 days.  No evidence of subchorionic hemorrhage.   Electronically Signed   By: Laveda Abbe M.D.   On: 11/19/2013 12:51    MAU Course  Procedures  MDM FHT: 174 by doppler Due to marked tenderness, US OB ordered  Reviewed findings of [place] with Dr. Jolayne Panther, who came and spoke with the patient about the [place] and the need to see Maternal-Fetal Medicine  Assessment and Plan  A: Fall in pregnancy Abnormalities on [place]  P: Flexeril 5 mg TID PO as needed for muscle spasms and pain Promethazine 12.5 mg PO q6 hours as needed for nausea/vomiting Consult to Maternal-Fetal Medicine  placed-appt scheduled for 11am on 6/15 Instructed to keep appointment on 6/26 in [facility] unless MFM tells her otherwise Return to MAU if abdominal pain worsens, starts having vaginal bleeding, or for any other reason as needed  Lauren L Sauve 11/19/2013, 11:55 AM   I have seen and evaluated the patient with the NP student. I agree with the assessment and plan as written above.   Freddi Starr, PA-C 11/19/2013 2:45 PM

## 2013-11-19 NOTE — MAU Note (Signed)
Pt sore on lower left abdomen since fall, no bruising or redness noted on abdomen.  Unable to keep any food or liquid down since fall.

## 2013-11-19 NOTE — MAU Note (Signed)
Patient states she was walking down stairs last night and fell and hit her abdomen directly on the railing. States she has had vomiting ever since she fell and abdominal pain since this am. Denies bleeding or leaking.

## 2013-11-20 ENCOUNTER — Encounter (HOSPITAL_COMMUNITY): Payer: Self-pay

## 2013-11-20 ENCOUNTER — Ambulatory Visit (HOSPITAL_COMMUNITY)
Admission: RE | Admit: 2013-11-20 | Discharge: 2013-11-20 | Disposition: A | Discharge status: Home / Self Care | Payer: Medicaid Other | Source: Ambulatory Visit | Attending: Medical | Admitting: Medical

## 2013-11-20 ENCOUNTER — Other Ambulatory Visit: Payer: Self-pay | Admitting: Medical

## 2013-11-20 VITALS — BP 113/64 | HR 59 | Wt 95.0 lb

## 2013-11-20 DIAGNOSIS — IMO0002 Reserved for concepts with insufficient information to code with codable children: Secondary | ICD-10-CM

## 2013-11-20 DIAGNOSIS — O351XX0 Maternal care for (suspected) chromosomal abnormality in fetus, not applicable or unspecified: Secondary | ICD-10-CM | POA: Insufficient documentation

## 2013-11-20 DIAGNOSIS — O3510X0 Maternal care for (suspected) chromosomal abnormality in fetus, unspecified, not applicable or unspecified: Secondary | ICD-10-CM | POA: Insufficient documentation

## 2013-11-20 NOTE — Progress Notes (Addendum)
Genetic Counseling  High-Risk Gestation Note  Appointment Date:  11/20/2013 Referred By: Jenny Has, PA-C Date of Birth:  Jun 12, 1991 Partner: Jenny Myers   Pregnancy History: [id number] Estimated Date of Delivery: 05/24/14 Estimated Gestational Age: 27w4dAttending: MRenella Cunas MD   I met with [name] her partner, Jenny Myers for genetic counseling because of the previous ultrasound finding of cystic hygroma.  [facility] cystic hyg[facility]. Follow-up limited ultrasound was performed today. Complete ultrasound results reported separately.   The couple was counseled that a cystic hygroma describes a septated fluid filled sac at the back of the neck that typically results from failure of the fetal lymphatic system.  We discussed the various etiologies for a hygroma including normal variation (immature lymphatic system), a chromosome condition, single gene condition, congenital anomaly (heart defect), or maternal exposure in pregnancy (such as viral infection).    We discussed chromosomes, non-disjunction, age related risks for aneuploidy, and the ~50% ultrasound adjusted risk for a chromosome condition based on the finding of a cystic hygroma.  We discussed the common features of Down syndrome, Turner syndrome, and trisomies 13 and 18.  We also discussed that cystic hygromas can also be caused by a variety of other chromosome aberrations including: microdeletions, microduplications, and translocations.    We discussed additional available screening options for fetal aneuploidy including noninvasive prenatal screening (NIPS)/cell free DNA testing. We discussed the conditions for which this screens, the detection rates and false positive rates. She was counseled that NIPS/cfDNA testing can detect a few microdeletions, but not those most commonly associated with cystic hygromas or abnormal nuchal translucency  measurements.  We reviewed the availability of diagnostic testing (chromosome analysis) via CVS and amniocentesis. We discussed the risks, benefits, and limitations of each.  She was counseled regarding the associated risks for complications for each: approximately 1 in 100 for CVS and approximately 1 in 300-500 for amniocentesis, including spontaneous pregnancy loss for both.  Additionally, we discussed that microarray analysis can be performed on cells obtained from CVS or amniocentesis.  [name] counseled that microarray analysis is a molecular based technique in which a test sample of DNA (fetal) is compared to a reference (normal) genome in order to determine if the test sample Myers any extra or missing genetic information.  Microarray analysis allows for the detection of genetic deletions and duplications that are 1854times smaller than those identified by routine chromosome analysis.  We discussed that recent publications show that approximately 6% of patients with an abnormal fetal ultrasound and a normal fetal karyotype had a significant microdeletion/microduplication detected by prenatal microarray analysis.   After consideration of all the options, [name] interest in having a chorionic villus sampling (CVS) for both chromosome analysis and possible reflex to microarray testing.  She was scheduled to return on 11/24/13 for ultrasound and CVS.      We also discussed that a cystic hygroma can be a feature of many underlying single gene conditions, such as Noonan syndrome, SLOS, and skeletal dysplasias.  We discussed that prenatal diagnosis is available for some single gene conditions, but that in most cases targeted analysis is recommended by a medical geneticist following a post-natal physical exam and review of medical records.  If however, ultrasound findings or a positive family history strongly suggest an increased risk for a specific condition, testing may be performed  prenatally.  We reviewed that a more detailed ultrasound would be available  at ~[redacted] weeks gestation.   Additionally, we discussed that congenital heart defects (CHD) are commonly associated with cystic hygromas.  The prevalence of a CHD is in the order of 6 times higher in fetuses with a cystic hygroma/increased nuchal translucency as compared to an unselected population.  There does not appear to be an association between any specific CHD and cystic hygromas.   We discussed that the chance for a CHD increases exponentially with increasing NT measurements.  For this reason, we discussed the option of a fetal echocardiogram later in the pregnancy.       We discussed that the fetal prognosis is largely dependent upon the underlying cause of the hygroma, but that there are indications by ultrasound, such as hydrops, which significantly increase the likelihood of a poor prognosis. She understands that cystic hygromas may worsen and progress to hydrops fetalis, improve and regress, or remain unchanged at follow up ultrasounds.  They understand that while many of the causes of a cystic hygroma can be investigated during pregnancy, not all conditions and causes can be determined prenatally.     We discussed the option of continuing the pregnancy versus termination of pregnancy. The couple did not indicate if they were considering TOP but stated that they were interested in CVS given the potential for it to provide sooner information about possible etiologies compared to testing offered later in gestation.      Both family histories were reviewed and found to be contributory for autism in the patient's paternal half-brother. He is currently 4 years old, and no etiology is known. Additionally, the patient reported a paternal aunt with high functioning autism. The cause is also unknown for her autism. The father of the pregnancy reported a female paternal first cousin with autism. We discussed that autism is part of the  spectrum of conditions referred to as Autistic spectrum disorders (ASD). We discussed that ASDs are among the most common neurodevelopmental disorders, with approximately 1 in 88 children meeting criteria for ASD. Approximately 80% of individuals diagnosed are female. There is strong evidence that genetic factors play a critical role in development of ASD. There have been recent advances in identifying specific genetic causes of ASD, however, there are still many individuals for whom the etiology of the ASD is not known. Once a family Myers a child with a diagnosis of ASD, there is a 13.5% chance to have another child with ASD. If the pregnancy is female the chance is approximately 9%, and approximately 26% if the pregnancy is female. Data are limited regarding recurrence risk estimate for extended degree relatives in the absence of an identified cause for autism in the family. They understand that at this time there is not genetic testing available for ASD for most families.  The father of the pregnancy reported that his brother Myers sickle cell trait. Mr. Tamala Julian stated that he Myers not had screening himself yet. Ms. Fortenberry had hemoglobin electrophoresis previously performed in 2013 through [facility]. Ms. Lameshia Hypolite was provided with written information regarding sickle cell anemia (SCA) including the carrier frequency and incidence in the African-American population, the availability of carrier testing and prenatal diagnosis if indicated.  In addition, we discussed that hemoglobinopathies are routinely screened for as part of the  newborn screening panel.  Without further information regarding the provided family history, an accurate genetic risk cannot be calculated. Further genetic counseling is warranted if more information is obtained.  Ms. Pyeatt denied exposure to environmental toxins or  chemical agents. She denied the use of alcohol, tobacco or street drugs. She denied significant  viral illnesses during the course of her pregnancy.     Ms. Oni Dietzman denied exposure to environmental toxins or chemical agents. She denied the use of alcohol, tobacco or street drugs. She denied significant viral illnesses during the course of her pregnancy. Her medical and surgical histories were noncontributory.   I counseled this couple regarding the above risks and available options.  The approximate face-to-face time with the genetic counselor was 40 minutes.  Kandra Nicolas Gildardo Griffes, MS Certified Genetic Counselor 11/20/2013

## 2013-11-24 ENCOUNTER — Ambulatory Visit (HOSPITAL_COMMUNITY)
Admission: RE | Admit: 2013-11-24 | Discharge: 2013-11-24 | Disposition: A | Discharge status: Home / Self Care | Payer: Medicaid Other | Source: Ambulatory Visit | Attending: Medical | Admitting: Medical

## 2013-11-24 ENCOUNTER — Other Ambulatory Visit: Payer: Self-pay

## 2013-11-24 DIAGNOSIS — IMO0002 Reserved for concepts with insufficient information to code with codable children: Secondary | ICD-10-CM

## 2013-11-24 DIAGNOSIS — O358XX0 Maternal care for other (suspected) fetal abnormality and damage, not applicable or unspecified: Secondary | ICD-10-CM | POA: Insufficient documentation

## 2013-11-24 LAB — ROUTINE CHROMOSOME - KARYOTYPE + FISH

## 2013-11-26 ENCOUNTER — Ambulatory Visit (HOSPITAL_COMMUNITY)
Admission: RE | Admit: 2013-11-26 | Discharge: 2013-11-26 | Disposition: A | Discharge status: Home / Self Care | Payer: Medicaid Other | Source: Ambulatory Visit | Attending: Medical | Admitting: Medical

## 2013-11-26 ENCOUNTER — Telehealth (HOSPITAL_COMMUNITY): Payer: Self-pay | Admitting: MS"

## 2013-11-26 NOTE — Telephone Encounter (Signed)
Called Jenny Myers to discuss the preliminary FISH results from her CVS. 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 Discussed with the patient that the majority of pregnancies with Turner syndrome miscarry. Briefly reviewed features of Turner syndrome for pregnancies that do not miscarry. However, reviewed that given the ultrasound findings of hygroma and hydrops, aside for underlying diagnosis of Turner syndrome is very poor prognosis and that miscarriage is likely. Reviewed options of continuing the pregnancy versus termination of pregnancy. Patient undecided. Offered to discuss these results in person. Patient would like to return for follow-up genetic counseling on 11/26/13 to review these results.  We again discussed the limitations of FISH and that final results are still pending and will be available in 1-2 weeks.  All questions were answered to her satisfaction, she was encouraged to call with additional questions or concerns.  Quinn Plowman, MS Certified Genetic Counselor 11/26/2013 3:50 PM

## 2013-11-26 NOTE — Telephone Encounter (Signed)
Called [name] regarding termination of pregnancy options. Discussed that the procedure can be facilitated through [facility]. However, there is no financial aid/charity care through [facility]. Stated that I do not known the exact cost, but that in general the procedure costs more in a hospital setting, usually thousands of dollars. Also discussed the option of planned parenthood in [place] and that the cost is several hundred dollars for the procedure. The patient stated that [place] is too far away. Reviewed that our physicians could perform the procedure. It would be done at [facility]. The patient can apply for charity care through [facility] and through [facility], given that the physicians are affiliated with [facility] but procedure is performed at [place]. In our recent experience, patients who meet qualifications and apply for charity care have been approved, meaning they would not be responsible for the cost of the procedure. The patient inquired about whether the physicians at [facility] stated when the procedure would be done. Stated that I did not know but could inquire about this. She stated that she would prefer to have the procedure at [facility] given that they do not have the means of transportation to go to [place]. Clarified with patient that she would prefer for procedure to be done at [facility] and be billed for the cost versus having it performed through [facility] and likely being approved for charity care. She confirmed that is her desire at this time. I stated I would contact the [facility] and work on arranging this for her.   [name] 11/26/2013 4:08 PM

## 2013-11-26 NOTE — Progress Notes (Signed)
Genetic Counseling  High-Risk Gestation Note  Appointment Date:  11/26/2013 Referred By: [name], PA-C Date of Birth:  August 13, 1990 Partner:  Jenny Myers    Pregnancy History: [id number] Estimated Date of Delivery: 05/24/14 Estimated Gestational Age: 4w3dAttending: [place] MD  I met with Jenny Myers her partner for follow-up genetic counseling to review positive FISH results from CVS for Turner syndrome.   We discussed Turner syndrome in detail. We reviewed chromosomes and nondisjunction in detail. They understand that Turner syndrome occurs when a girl receives only one copy of an X chromosome instead of the expected two copies. The missing X chromosome is present from the time of conception, and could have resulted from maternal meiotic nondisjunction, or more commonly (80%) from paternal nondisjunction. We discussed that Turner syndrome is a sporadic condition and is not the result of anything that a parent did or did not do. They were counseled that when a child has missing or extra chromosome material, that child has an increased risk to have birth defects, neurodevelopmental differences, and a variety of health concerns depending on the specific chromosome alteration. They were then counseled that Turner syndrome is a multisystem disorder occuring in approximately 1 in 2,000 female live births. Because every cell of the body is missing an X chromosome, virtually all organ systems can be affected. Lymphedema, cardiac and renal anomalies, short stature, developmental delay, and infertility due to ovarian dysfunction are commonly described in girls with this condition. Pregnancies with Turner syndrome have an increased risk for miscarriage, most often in the first trimester, or stillbirth (>99%). The risk for neonatal death is also increased. However, the couple understands that we are not able to predict which pregnancies will survive to term or the exact timing of when a  miscarriage would occur for pregnancies that do not survive to term. We reviewed that prognosis for the current pregnancy is very guarded given the ultrasound findings of cystic hygroma and hydrops.   We reviewed options for the pregnancy including termination of pregnancy and continuing the pregnancy with expectant management (including second trimester targeted ultrasound and fetal echocardiogram). After careful consideration, the couple stated that they would like to pursue termination of pregnancy. They understand that the legal limit for TOP in [place] is [redacted] weeks gestation. We reviewed options of this being coordinated through her primary OB, through our practice and at a clinic setting, such as Planned Parenthood. The patient expressed interest in pursuing through her OB office. We planned to obtain additional information regarding this possibility and call the patient back today.   We reviewed that [place] not distinguish between different types of Turner syndrome and that final karyotype is currently pending. Once a couple has had one pregnancy with Turner syndrome, the risk of recurrence for a future pregnancy does not appear to be increased above the risk of the general population. Additionally, the chance to have a pregnancy with Turner syndrome does not increase with maternal age as it does with other extra chromosome conditions, such as Down syndrome. To summarize, this couple was counseled that they likely do not have an increased risk to have another pregnancy with Turner syndrome, if the diagnosis is confirmed in the current pregnancy via final karyotype. However, final chromosome analysis is needed to confirm the diagnosis and determine whether it was likely sporadic or due to a different underlying mechanism.   The couple stated that had no additional questions at this time. We also provided the couple with  contact information for Heartstrings, Pregnancy and Infant loss support group.   Jenny Oman, MS Certified Genetic Counselor 11/26/2013

## 2013-11-27 ENCOUNTER — Telehealth (HOSPITAL_COMMUNITY): Payer: Self-pay | Admitting: MS"

## 2013-11-27 NOTE — Telephone Encounter (Signed)
Called Ms. Littie DeedsGentry to discuss updated information regarding proceeding with termination of pregnancy. If she would like to [facility], per my discussion with Dr. Macon LargeAnyanwu, patient first needs to go to Cashier's office [facility], and let them know she is planning to pursue an elective surgery. The cashier's office can then get in touch with Dr. Macon LargeAnyanwu to get detailed information. Cashier's office can then review cost and payment plan with patient. Discussed with Ms. Littie DeedsGentry that she may be required to pay some up front, but that they would discuss that with her. I also reviewed with the patient that she previously stated she does not have transportation to [place]. However, if there was some way she could work out transportation to [place], our group could provide her with information on charity care application for [facility], and if approved, she would likely not be responsible for any cost of the procedure. Ms. Littie DeedsGentry stated that she would first visit cashier's office [facility], and if she does not feel that is best option then she would let me know and possibly pursue procedure through [facility] physicians [facility]   Clydie BraunKaren Nyna Chilton 11/27/2013 8:46 AM

## 2013-11-30 ENCOUNTER — Encounter (HOSPITAL_COMMUNITY): Payer: Medicaid Other

## 2013-11-30 ENCOUNTER — Other Ambulatory Visit (HOSPITAL_COMMUNITY): Payer: Medicaid Other

## 2013-12-09 ENCOUNTER — Encounter: Payer: Medicaid Other | Admitting: Advanced Practice Midwife

## 2013-12-14 ENCOUNTER — Telehealth (HOSPITAL_COMMUNITY): Payer: Self-pay | Admitting: MS"

## 2013-12-14 NOTE — Telephone Encounter (Signed)
Patient called stating that she thinks her mucus plug fell out this morning. Patient stated she did not keep initial OB visit with [facility] and has not established OB care. Instructed patient to go to Maternity Admissions given that I am not authorized to answer questions of this nature.   Clydie BraunKaren Corneliussen 12/14/2013 10:46 AM

## 2013-12-15 ENCOUNTER — Inpatient Hospital Stay (HOSPITAL_COMMUNITY)
Admission: AD | Admit: 2013-12-15 | Discharge: 2013-12-15 | Disposition: A | Discharge status: Home / Self Care | Payer: Medicaid Other | Source: Ambulatory Visit | Attending: Obstetrics & Gynecology | Admitting: Obstetrics & Gynecology

## 2013-12-15 ENCOUNTER — Encounter (HOSPITAL_COMMUNITY): Payer: Self-pay | Admitting: *Deleted

## 2013-12-15 DIAGNOSIS — O26852 Spotting complicating pregnancy, second trimester: Secondary | ICD-10-CM

## 2013-12-15 DIAGNOSIS — O26859 Spotting complicating pregnancy, unspecified trimester: Secondary | ICD-10-CM | POA: Insufficient documentation

## 2013-12-15 DIAGNOSIS — O358XX0 Maternal care for other (suspected) fetal abnormality and damage, not applicable or unspecified: Secondary | ICD-10-CM | POA: Insufficient documentation

## 2013-12-15 NOTE — MAU Note (Signed)
PT  SAYS SHE SAW MUCUS AT 1100  THIS AM  THEN SHE STARTED CRAMPING  AND  MORE MUCUS AND  THEN BLOODY MUCUS.  GETS PNC-  DOWNSTAIRS.  LAST APPOINTMENT  WAS 6-24-  SHE MISSED IT  THEN NEXT IS 7-2.Marland Kitchen.  LAST SEX- 6-23.

## 2013-12-15 NOTE — Discharge Instructions (Signed)
Second Trimester of Pregnancy The second trimester is from week 13 through week 28, months 4 through 6. The second trimester is often a time when you feel your best. Your body has also adjusted to being pregnant, and you begin to feel better physically. Usually, morning sickness has lessened or quit completely, you may have more energy, and you may have an increase in appetite. The second trimester is also a time when the fetus is growing rapidly. At the end of the sixth month, the fetus is about 9 inches long and weighs about 1 pounds. You will likely begin to feel the baby move (quickening) between 18 and 20 weeks of the pregnancy. BODY CHANGES Your body goes through many changes during pregnancy. The changes vary from woman to woman.   Your weight will continue to increase. You will notice your lower abdomen bulging out.  You may begin to get stretch marks on your hips, abdomen, and breasts.  You may develop headaches that can be relieved by medicines approved by your health care provider.  You may urinate more often because the fetus is pressing on your bladder.  You may develop or continue to have heartburn as a result of your pregnancy.  You may develop constipation because certain hormones are causing the muscles that push waste through your intestines to slow down.  You may develop hemorrhoids or swollen, bulging veins (varicose veins).  You may have back pain because of the weight gain and pregnancy hormones relaxing your joints between the bones in your pelvis and as a result of a shift in weight and the muscles that support your balance.  Your breasts will continue to grow and be tender.  Your gums may bleed and may be sensitive to brushing and flossing.  Dark spots or blotches (chloasma, mask of pregnancy) may develop on your face. This will likely fade after the baby is born.  A dark line from your belly button to the pubic area (linea nigra) may appear. This will likely fade  after the baby is born.  You may have changes in your hair. These can include thickening of your hair, rapid growth, and changes in texture. Some women also have hair loss during or after pregnancy, or hair that feels dry or thin. Your hair will most likely return to normal after your baby is born. WHAT TO EXPECT AT YOUR PRENATAL VISITS During a routine prenatal visit:  You will be weighed to make sure you and the fetus are growing normally.  Your blood pressure will be taken.  Your abdomen will be measured to track your baby's growth.  The fetal heartbeat will be listened to.  Any test results from the previous visit will be discussed. Your health care provider may ask you:  How you are feeling.  If you are feeling the baby move.  If you have had any abnormal symptoms, such as leaking fluid, bleeding, severe headaches, or abdominal cramping.  If you have any questions. Other tests that may be performed during your second trimester include:  Blood tests that check for:  Low iron levels (anemia).  Gestational diabetes (between 24 and 28 weeks).  Rh antibodies.  Urine tests to check for infections, diabetes, or protein in the urine.  An ultrasound to confirm the proper growth and development of the baby.  An amniocentesis to check for possible genetic problems.  Fetal screens for spina bifida and Down syndrome. HOME CARE INSTRUCTIONS   Avoid all smoking, herbs, alcohol, and unprescribed   drugs. These chemicals affect the formation and growth of the baby.  Follow your health care provider's instructions regarding medicine use. There are medicines that are either safe or unsafe to take during pregnancy.  Exercise only as directed by your health care provider. Experiencing uterine cramps is a good sign to stop exercising.  Continue to eat regular, healthy meals.  Wear a good support bra for breast tenderness.  Do not use hot tubs, steam rooms, or saunas.  Wear your  seat belt at all times when driving.  Avoid raw meat, uncooked cheese, cat litter boxes, and soil used by cats. These carry germs that can cause birth defects in the baby.  Take your prenatal vitamins.  Try taking a stool softener (if your health care provider approves) if you develop constipation. Eat more high-fiber foods, such as fresh vegetables or fruit and whole grains. Drink plenty of fluids to keep your urine clear or pale yellow.  Take warm sitz baths to soothe any pain or discomfort caused by hemorrhoids. Use hemorrhoid cream if your health care provider approves.  If you develop varicose veins, wear support hose. Elevate your feet for 15 minutes, 3-4 times a day. Limit salt in your diet.  Avoid heavy lifting, wear low heel shoes, and practice good posture.  Rest with your legs elevated if you have leg cramps or low back pain.  Visit your dentist if you have not gone yet during your pregnancy. Use a soft toothbrush to brush your teeth and be gentle when you floss.  A sexual relationship may be continued unless your health care provider directs you otherwise.  Continue to go to all your prenatal visits as directed by your health care provider. SEEK MEDICAL CARE IF:   You have dizziness.  You have mild pelvic cramps, pelvic pressure, or nagging pain in the abdominal area.  You have persistent nausea, vomiting, or diarrhea.  You have a bad smelling vaginal discharge.  You have pain with urination. SEEK IMMEDIATE MEDICAL CARE IF:   You have a fever.  You are leaking fluid from your vagina.  You have spotting or bleeding from your vagina.  You have severe abdominal cramping or pain.  You have rapid weight gain or loss.  You have shortness of breath with chest pain.  You notice sudden or extreme swelling of your face, hands, ankles, feet, or legs.  You have not felt your baby move in over an hour.  You have severe headaches that do not go away with  medicine.  You have vision changes. Document Released: 05/29/2001 Document Revised: 06/09/2013 Document Reviewed: 08/05/2012 Exit[employer] Patient Information 2015 [employer]. This information is not intended to replace advice given to you by your health care provider. Make sure you discuss any questions you have with your health care provider.  

## 2013-12-15 NOTE — MAU Provider Note (Signed)
  History     CSN: [id number]  Arrival date and time: 12/15/13 0030   First Luvern Mcisaac Initiated Contact with Patient 12/15/13 0147      No chief complaint on file.  HPI  Jenny Myers is a 23 y.o. [id number] at 1312w1d who presents today with spotting and cramping. She states that she has been spotting isnce about 1400 today. 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 However, they have not been able to do this as they cannot afford it. She has an appointment in the clinic on 12/17/13.   Past Medical History  Diagnosis Date  . Ectopic pregnancy 11/2010    Right salpingectomy  . Umbilical hernia     Past Surgical History  Procedure Laterality Date  . Salpingectomy  2012    left ovary  . Wisdom tooth extraction      Family History  Problem Relation Age of Onset  . Hypertension Mother   . Diabetes Father   . Hypertension Father   . Vision loss Paternal Grandmother   . Other Neg Hx     History  Substance Use Topics  . Smoking status: Never Smoker   . Smokeless tobacco: Never Used  . Alcohol Use: No     Comment: not since pregnancy    Allergies:  Allergies  Allergen Reactions  . Garlic Anaphylaxis  . Penicillins Anaphylaxis and Nausea And Vomiting  . Carrot Flavor Hives    Prescriptions prior to admission  Medication Sig Dispense Refill  . acetaminophen (TYLENOL) 325 MG tablet Take 650 mg by mouth every 6 (six) hours as needed for mild pain.       . cyclobenzaprine (FLEXERIL) 5 MG tablet Take 1 tablet (5 mg total) by mouth 3 (three) times daily as needed for muscle spasms.  15 tablet  0  . Prenatal Vit-Fe Fumarate-FA (PRENATAL MULTIVITAMIN) TABS tablet Take 1 tablet by mouth daily at 12 noon.      . promethazine (PHENERGAN) 12.5 MG tablet Take 1 tablet (12.5 mg total) by mouth every 6 (six) hours as needed for nausea or vomiting.  30 tablet  0    ROS Physical Exam   Blood pressure 97/78, pulse 88, temperature  98.9 F (37.2 C), temperature source Oral, resp. rate 20, height 4\' 11"  (1.499 m), weight 44.906 kg (99 lb), last menstrual period 09/11/2013, not currently breastfeeding.  Physical Exam  Constitutional: She is oriented to person, place, and time. She appears well-developed and well-nourished. No distress.  Cardiovascular: Normal rate.   Respiratory: Effort normal.  GI: Soft. There is no tenderness.  Genitourinary:  External: no lesion Vagina: small amount of white discharge. No blood seen  Cervix: pink, smooth, closed/thick/high  Uterus: AGA, FHT 165 with doppler     Neurological: She is alert and oriented to person, place, and time.  Skin: Skin is warm and dry.  Psychiatric: She has a normal mood and affect.    MAU Course  Procedures    Assessment and Plan   1. Spotting affecting pregnancy in second trimester    Second trimester precautions reviewed Return to MAU as needed FU with the office as planned for 12/17/13    Tawnya CrookHogan, Heather Donovan 12/15/2013, 1:53 AM

## 2013-12-24 ENCOUNTER — Telehealth (HOSPITAL_COMMUNITY): Payer: Self-pay | Admitting: MS"

## 2013-12-24 NOTE — Telephone Encounter (Signed)
Attempted to call patient to review final karyotype result from [facility]. Message at number states that the number is invalid or incorrect.   Jenny BraunKaren Vernal Rutan 12/24/2013 8:44 AM

## 2013-12-29 ENCOUNTER — Other Ambulatory Visit: Payer: Self-pay | Admitting: Obstetrics & Gynecology

## 2013-12-29 ENCOUNTER — Telehealth (HOSPITAL_COMMUNITY): Payer: Self-pay | Admitting: MS"

## 2013-12-29 DIAGNOSIS — O3680X1 Pregnancy with inconclusive fetal viability, fetus 1: Secondary | ICD-10-CM

## 2013-12-29 NOTE — Telephone Encounter (Signed)
Called number back to speak with Lezette. Spoke with her partner, Gregary SignsSean. Termination of pregnancy is scheduled via dilation and evacuation at 9:05 am on Friday, 7/17 at [facility] with Dr. Berna SpareJosh Nitsche. Patient has an appointment at 11:00 on Thursday in the [facility]. Instructed Gregary SignsSean that Mikel will check in at Admissions and then will go to the 4th floor for the appointment. Patient will have a telephone appointment tomorrow from [facility] for pre-anesthesia appointment and needs to be available from 2-4 pm for the appointment. Gregary SignsSean stated that they are using one phone currently and that he will be at work during that time. I informed him that I am not familiar with whether or not there is a number that could be called back but that he could likely ask for one when they call. However, this appointment would need to be completed tomorrow. Also explained that it would be helpful to do an ultrasound to assess viability of the pregnancy given the increased risk for miscarriage with Turner syndrome pregnancies and particularly given the history of hydrops in the pregnancy. Ultrasound is planned for tomorrow at 8:15 am [facility].   Also informed Gregary SignsSean that Chrysa needs to call financial counselors at both [facility] and [facility] tomorrow morning. The contact number are [phone] for [facility] and [phone] for [facility]. She should inform them that she is scheduled for surgery on Friday with Dr. Berna SpareJosh Nitsche and that code for the procedure is [phone].   Asked them to call back with any questions.   Clydie BraunKaren Fynley Chrystal 12/29/2013 5:01 PM

## 2013-12-29 NOTE — Telephone Encounter (Signed)
Patient called back asking if she could proceed with termination of pregnancy through [place]. Reviewed that the limit for TOP is [redacted] weeks gestation but that I would contact providers in our group to see if we can facilitate TOP for the patient and will call her back. She stated that she has not had any OB visits and no recent ultrasounds.   Clydie BraunKaren Ansh Fauble 12/29/2013 11:08 AM

## 2013-12-30 ENCOUNTER — Ambulatory Visit (HOSPITAL_COMMUNITY): Payer: Medicaid Other

## 2013-12-30 ENCOUNTER — Ambulatory Visit (HOSPITAL_COMMUNITY)
Admission: RE | Admit: 2013-12-30 | Discharge: 2013-12-30 | Disposition: A | Discharge status: Home / Self Care | Payer: Medicaid Other | Source: Ambulatory Visit | Attending: Obstetrics & Gynecology | Admitting: Obstetrics & Gynecology

## 2013-12-30 DIAGNOSIS — O3680X1 Pregnancy with inconclusive fetal viability, fetus 1: Secondary | ICD-10-CM

## 2013-12-30 DIAGNOSIS — Z3689 Encounter for other specified antenatal screening: Secondary | ICD-10-CM | POA: Diagnosis not present

## 2013-12-30 DIAGNOSIS — O3680X0 Pregnancy with inconclusive fetal viability, not applicable or unspecified: Secondary | ICD-10-CM | POA: Diagnosis not present

## 2014-04-19 ENCOUNTER — Encounter (HOSPITAL_COMMUNITY): Payer: Self-pay | Admitting: *Deleted

## 2014-04-23 ENCOUNTER — Inpatient Hospital Stay (HOSPITAL_COMMUNITY)
Admission: AD | Admit: 2014-04-23 | Discharge: 2014-04-24 | Disposition: A | Discharge status: Home / Self Care | Payer: Medicaid Other | Source: Ambulatory Visit | Attending: Obstetrics & Gynecology | Admitting: Obstetrics & Gynecology

## 2014-04-23 ENCOUNTER — Encounter (HOSPITAL_COMMUNITY): Payer: Self-pay | Admitting: *Deleted

## 2014-04-23 DIAGNOSIS — O26899 Other specified pregnancy related conditions, unspecified trimester: Secondary | ICD-10-CM

## 2014-04-23 DIAGNOSIS — Z3A01 Less than 8 weeks gestation of pregnancy: Secondary | ICD-10-CM | POA: Diagnosis not present

## 2014-04-23 DIAGNOSIS — R109 Unspecified abdominal pain: Secondary | ICD-10-CM | POA: Diagnosis not present

## 2014-04-23 DIAGNOSIS — O26891 Other specified pregnancy related conditions, first trimester: Secondary | ICD-10-CM | POA: Insufficient documentation

## 2014-04-23 DIAGNOSIS — O9989 Other specified diseases and conditions complicating pregnancy, childbirth and the puerperium: Secondary | ICD-10-CM

## 2014-04-23 DIAGNOSIS — O4690 Antepartum hemorrhage, unspecified, unspecified trimester: Secondary | ICD-10-CM

## 2014-04-23 LAB — URINALYSIS, ROUTINE W REFLEX MICROSCOPIC
Bilirubin Urine: NEGATIVE
Glucose, UA: NEGATIVE mg/dL
Ketones, ur: 15 mg/dL — AB
LEUKOCYTES UA: NEGATIVE
NITRITE: NEGATIVE
PROTEIN: NEGATIVE mg/dL
SPECIFIC GRAVITY, URINE: 1.025 (ref 1.005–1.030)
UROBILINOGEN UA: 0.2 mg/dL (ref 0.0–1.0)
pH: 6.5 (ref 5.0–8.0)

## 2014-04-23 LAB — URINE MICROSCOPIC-ADD ON

## 2014-04-23 LAB — POCT PREGNANCY, URINE: PREG TEST UR: POSITIVE — AB

## 2014-04-23 MED ORDER — HYDROMORPHONE HCL 1 MG/ML IJ SOLN
1.0000 mg | Freq: Once | INTRAMUSCULAR | Status: AC
Start: 2014-04-23 — End: 2014-04-23
  Administered 2014-04-23: 1 mg via INTRAMUSCULAR
  Filled 2014-04-23: qty 1

## 2014-04-23 NOTE — MAU Note (Addendum)
Pt reports having pain in her LRQ that started 3 hrs ago. Pt unable to give urine sample at thsi time.

## 2014-04-23 NOTE — MAU Provider Note (Signed)
History     CSN: [id number]  Arrival date and time: 04/23/14 2239   First Provider Initiated Contact with Patient 04/23/14 2316      Chief Complaint  Patient presents with  . Abdominal Pain   HPI   [name] is a 23 y.o. female [phone] at [redacted]w[redacted]d who presents with abdominal pain; the pain started 3 hours ago; the pain is in her left ovary. She was unaware that she was pregnant. She is certain of her last menstrual cycle. 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   OB History    Gravida Para Term Preterm AB TAB SAB Ectopic Multiple Living   6 2 2  0 3 1 1 1  0 2      Past Medical History  Diagnosis Date  . Ectopic pregnancy 11/2010    Right salpingectomy  . Umbilical hernia     Past Surgical History  Procedure Laterality Date  . Salpingectomy  2012    left ovary  . Wisdom tooth extraction      Family History  Problem Relation Age of Onset  . Hypertension Mother   . Diabetes Father   . Hypertension Father   . Vision loss Paternal Grandmother   . Other Neg Hx     History  Substance Use Topics  . Smoking status: Never Smoker   . Smokeless tobacco: Never Used  . Alcohol Use: No     Comment: not since pregnancy    Allergies:  Allergies  Allergen Reactions  . Garlic Anaphylaxis  . Penicillins Anaphylaxis and Nausea And Vomiting  . Carrot Flavor Hives    Prescriptions prior to admission  Medication Sig Dispense Refill Last Dose  . acetaminophen (TYLENOL) 325 MG tablet Take 650 mg by mouth every 6 (six) hours as needed for mild pain.    Past Week at Unknown time  . cyclobenzaprine (FLEXERIL) 5 MG tablet Take 1 tablet (5 mg total) by mouth 3 (three) times daily as needed for muscle spasms. 15 tablet 0 Past Week at Unknown time  . Prenatal Vit-Fe Fumarate-FA (PRENATAL MULTIVITAMIN) TABS tablet Take 1 tablet by mouth daily at 12 noon.   12/14/2013 at Unknown time  . promethazine (PHENERGAN) 12.5 MG tablet Take 1 tablet (12.5 mg  total) by mouth every 6 (six) hours as needed for nausea or vomiting. 30 tablet 0 Past Week at Unknown time   Results for orders placed or performed during the hospital encounter of 04/23/14 (from the past 48 hour(s))  Urinalysis, Routine w reflex microscopic     Status: Abnormal   Collection Time: 04/23/14 11:04 PM  Result Value Ref Range   Color, Urine YELLOW YELLOW   APPearance CLEAR CLEAR   Specific Gravity, Urine 1.025 1.005 - 1.030   pH 6.5 5.0 - 8.0   Glucose, UA NEGATIVE NEGATIVE mg/dL   Hgb urine dipstick TRACE (A) NEGATIVE   Bilirubin Urine NEGATIVE NEGATIVE   Ketones, ur 15 (A) NEGATIVE mg/dL   Protein, ur NEGATIVE NEGATIVE mg/dL   Urobilinogen, UA 0.2 0.0 - 1.0 mg/dL   Nitrite NEGATIVE NEGATIVE   Leukocytes, UA NEGATIVE NEGATIVE  Urine microscopic-add on     Status: None   Collection Time: 04/23/14 11:04 PM  Result Value Ref Range   Squamous Epithelial / LPF RARE RARE   WBC, UA 0-2 <3 WBC/hpf   RBC / HPF 0-2 <3 RBC/hpf   Bacteria, UA RARE RARE   Urine-Other MUCOUS PRESENT   Pregnancy, urine  POC     Status: Abnormal   Collection Time: 04/23/14 11:06 PM  Result Value Ref Range   Preg Test, Ur POSITIVE (A) NEGATIVE    Comment:        THE SENSITIVITY OF THIS METHODOLOGY IS >24 mIU/mL   CBC     Status: Abnormal   Collection Time: 04/24/14 12:05 AM  Result Value Ref Range   WBC 10.0 4.0 - 10.5 K/uL   RBC 4.05 3.87 - 5.11 MIL/uL   Hemoglobin 12.1 12.0 - 15.0 g/dL   HCT 40.935.1 (L) 81.136.0 - 91.446.0 %   MCV 86.7 78.0 - 100.0 fL   MCH 29.9 26.0 - 34.0 pg   MCHC 34.5 30.0 - 36.0 g/dL   RDW 78.214.7 95.611.5 - 21.315.5 %   Platelets 349 150 - 400 K/uL  ABO/Rh     Status: None   Collection Time: 04/24/14 12:05 AM  Result Value Ref Range   ABO/RH(D) A POS   hCG, quantitative, pregnancy     Status: Abnormal   Collection Time: 04/24/14 12:05 AM  Result Value Ref Range   hCG, Beta Chain, Quant, S 92805 (H) <5 mIU/mL    Comment:          GEST. AGE      CONC.  (mIU/mL)   <=1 WEEK         5 - 50     2 WEEKS       50 - 500     3 WEEKS       100 - 10,000     4 WEEKS     1,000 - 30,000     5 WEEKS     3,500 - 115,000   6-8 WEEKS     12,000 - 270,000    12 WEEKS     15,000 - 220,000        FEMALE AND NON-PREGNANT FEMALE:     LESS THAN 5 mIU/mL   Wet prep, genital     Status: None   Collection Time: 04/24/14  1:21 AM  Result Value Ref Range   Yeast Wet Prep HPF POC NONE SEEN NONE SEEN   Trich, Wet Prep NONE SEEN NONE SEEN   Clue Cells Wet Prep HPF POC NONE SEEN NONE SEEN   WBC, Wet Prep HPF POC NONE SEEN NONE SEEN    Comment: FEW BACTERIA SEEN   [place] Ob Comp Less 14 Wks  04/24/2014   CLINICAL DATA:  Abdominal and pelvic pain. Estimated gestational age by LMP is 2 weeks 4 days. Quantitative beta HCG is not available.  EXAM: OBSTETRIC <14 WK ULTRASOUND  TECHNIQUE: Transabdominal ultrasound was performed for evaluation of the gestation as well as the maternal uterus and adnexal regions.  COMPARISON:  None.  FINDINGS: Intrauterine gestational sac: A single intrauterine pregnancy is visualized.  Yolk sac: Yolk sac is not identified consistent with gestational age.  Embryo:  Fetal pole is identified.  Cardiac Activity: Fetal cardiac activity is observed.  Heart Rate: 158 bpm  CRL:   35.7  mm   10 w 4 d                  US EDC: 11/16/2014  Maternal uterus/adnexae: Uterus is retroverted. No myometrial mass is identified. No subchorionic hemorrhage. Left ovary is identified and demonstrates normal follicular changes. No abnormal left adnexal mass lesion. Right ovary is identified and appears normal. No abnormal right adnexal masses. No free pelvic fluid collections.  IMPRESSION: Single living  intrauterine pregnancy. Estimated gestational age by crown-rump length is 10 weeks 4 days. No acute complication is identified.   Electronically Signed   By: Burman NievesWilliam  Stevens M.D.   On: 04/24/2014 01:07    Review of Systems  Constitutional: Negative for fever and chills.  Gastrointestinal: Positive for  nausea and abdominal pain (Left side).  Genitourinary: Negative for dysuria, urgency and frequency.       No vaginal discharge. + vaginal bleeding; spotting for several days. None now.   No dysuria.    Physical Exam   Blood pressure 124/53, pulse 93, temperature 98.6 F (37 C), temperature source Oral, resp. rate 18, height 4\' 11"  (1.499 m), weight 43.999 kg (97 lb), last menstrual period 04/06/2014, not currently breastfeeding.  Physical Exam  Constitutional: She is oriented to person, place, and time. She appears well-developed and well-nourished. No distress.  HENT:  Head: Normocephalic.  Neck: Neck supple.  Respiratory: Effort normal.  GI: Normal appearance. There is generalized tenderness. There is no rigidity, no rebound and no guarding.  Genitourinary:  Bimanual exam: Cervix closed, mild CMT  Uterus with tenderness, enlarged  Adnexa non tender, no masses bilaterally wet prep done Chaperone present for exam.   Musculoskeletal: Normal range of motion.  Neurological: She is alert and oriented to person, place, and time.  Skin: Skin is warm. She is not diaphoretic.  Psychiatric: Her behavior is normal.    MAU Course  Procedures  None  MDM Patient rates her pain 10/10 Dilaudid 1 mg IM CBC  Quant ABO RH US  Wet prep GC  A positive blood type  Pain is down to a 5/10 at this time.  Assessment and Plan   A: Abdominal pain in pregnancy SIUP @[redacted]w[redacted]d   P: Discharge home in stable condition Return to MAU if symptoms worsen Start prenatal care ASAP OK to take tylenol as needed, as directed on the bottle   Iona HansenJennifer Irene Rasch, NP 04/24/2014 2:10 AM

## 2014-04-24 ENCOUNTER — Encounter (HOSPITAL_COMMUNITY): Payer: Self-pay | Admitting: Obstetrics and Gynecology

## 2014-04-24 ENCOUNTER — Inpatient Hospital Stay (HOSPITAL_COMMUNITY): Payer: Medicaid Other

## 2014-04-24 LAB — ABO/RH: ABO/RH(D): A POS

## 2014-04-24 LAB — CBC
HEMATOCRIT: 35.1 % — AB (ref 36.0–46.0)
Hemoglobin: 12.1 g/dL (ref 12.0–15.0)
MCH: 29.9 pg (ref 26.0–34.0)
MCHC: 34.5 g/dL (ref 30.0–36.0)
MCV: 86.7 fL (ref 78.0–100.0)
PLATELETS: 349 10*3/uL (ref 150–400)
RBC: 4.05 MIL/uL (ref 3.87–5.11)
RDW: 14.7 % (ref 11.5–15.5)
WBC: 10 10*3/uL (ref 4.0–10.5)

## 2014-04-24 LAB — WET PREP, GENITAL
Clue Cells Wet Prep HPF POC: NONE SEEN
Trich, Wet Prep: NONE SEEN
WBC, Wet Prep HPF POC: NONE SEEN
Yeast Wet Prep HPF POC: NONE SEEN

## 2014-04-24 LAB — HIV ANTIBODY (ROUTINE TESTING W REFLEX): HIV 1&2 Ab, 4th Generation: NONREACTIVE

## 2014-04-24 LAB — HCG, QUANTITATIVE, PREGNANCY: HCG, BETA CHAIN, QUANT, S: 92805 m[IU]/mL — AB (ref <5)

## 2014-04-24 MED ORDER — PROMETHAZINE HCL 25 MG/ML IJ SOLN
12.5000 mg | Freq: Once | INTRAMUSCULAR | Status: AC
  Administered 2014-04-24: 12.5 mg via INTRAVENOUS
  Filled 2014-04-24: qty 1

## 2014-04-24 MED ORDER — HYDROMORPHONE HCL 1 MG/ML IJ SOLN
1.0000 mg | Freq: Once | INTRAMUSCULAR | Status: AC
  Administered 2014-04-24: 1 mg via INTRAVENOUS
  Filled 2014-04-24: qty 1

## 2014-04-24 MED ORDER — LACTATED RINGERS IV SOLN
INTRAVENOUS | Status: DC
  Administered 2014-04-24: via INTRAVENOUS

## 2014-04-24 NOTE — Discharge Instructions (Signed)

## 2014-06-18 NOTE — L&D Delivery Note (Signed)
Delivery Note At 9:17 PM a viable female was delivered via  (Presentation: OA).  APGAR: 9, 9; weight pending .   Placenta status: intact,  Cord: 3v  with the following complications: none  Anesthesia:  epidural Episiotomy:  none Lacerations:  Superficial peri-clitoral Suture Repair: none Est. Blood Loss (mL): 200cc    Mom to postpartum.  Baby to Couplet care / Skin to Skin.  Upon arrival patient was complete and pushing. She pushed with good maternal effort to deliver a healthy girl. Baby delivered without difficulty, was noted to have good tone and place on maternal abdomen for oral suctioning, drying and stimulation. Delayed cord clamping performed and cut by FOB. Placenta delivered intact with 3V cord. Vaginal canal with small superficial periclitoral lac (already hemostatic and good approximation) and perineum was inspected and intact. No suture repair needed. Pitocin was started and uterus massaged until bleeding slowed. Counts of sharps, instruments, and lap pads were all correct.    Saralyn PilarAlexander Karamalegos, DO [facility], PGY-2 11/05/2014, 9:37 PM   I was present and gloved for entire delivery No difficulty with shoulders Agree with above note Aviva SignsMarie L Harden Bramer, CNM

## 2014-07-15 ENCOUNTER — Other Ambulatory Visit (HOSPITAL_COMMUNITY)
Admission: RE | Admit: 2014-07-15 | Discharge: 2014-07-15 | Disposition: A | Discharge status: Home / Self Care | Payer: Medicaid Other | Source: Ambulatory Visit | Attending: Obstetrics & Gynecology | Admitting: Obstetrics & Gynecology

## 2014-07-15 ENCOUNTER — Other Ambulatory Visit: Payer: Self-pay | Admitting: Medical

## 2014-07-15 ENCOUNTER — Encounter: Payer: Self-pay | Admitting: Obstetrics & Gynecology

## 2014-07-15 ENCOUNTER — Ambulatory Visit (INDEPENDENT_AMBULATORY_CARE_PROVIDER_SITE_OTHER): Payer: Medicaid Other | Admitting: Obstetrics & Gynecology

## 2014-07-15 VITALS — BP 126/55 | HR 103 | Temp 98.3°F | Wt 107.7 lb

## 2014-07-15 DIAGNOSIS — F1210 Cannabis abuse, uncomplicated: Secondary | ICD-10-CM

## 2014-07-15 DIAGNOSIS — O0992 Supervision of high risk pregnancy, unspecified, second trimester: Secondary | ICD-10-CM

## 2014-07-15 DIAGNOSIS — O0930 Supervision of pregnancy with insufficient antenatal care, unspecified trimester: Secondary | ICD-10-CM

## 2014-07-15 DIAGNOSIS — Z01419 Encounter for gynecological examination (general) (routine) without abnormal findings: Secondary | ICD-10-CM | POA: Insufficient documentation

## 2014-07-15 DIAGNOSIS — Z124 Encounter for screening for malignant neoplasm of cervix: Secondary | ICD-10-CM

## 2014-07-15 DIAGNOSIS — Z23 Encounter for immunization: Secondary | ICD-10-CM

## 2014-07-15 DIAGNOSIS — Z803 Family history of malignant neoplasm of breast: Secondary | ICD-10-CM

## 2014-07-15 DIAGNOSIS — Z3492 Encounter for supervision of normal pregnancy, unspecified, second trimester: Secondary | ICD-10-CM

## 2014-07-15 DIAGNOSIS — O98312 Other infections with a predominantly sexual mode of transmission complicating pregnancy, second trimester: Secondary | ICD-10-CM

## 2014-07-15 DIAGNOSIS — Z113 Encounter for screening for infections with a predominantly sexual mode of transmission: Secondary | ICD-10-CM | POA: Diagnosis present

## 2014-07-15 DIAGNOSIS — K429 Umbilical hernia without obstruction or gangrene: Secondary | ICD-10-CM

## 2014-07-15 DIAGNOSIS — O09292 Supervision of pregnancy with other poor reproductive or obstetric history, second trimester: Secondary | ICD-10-CM

## 2014-07-15 DIAGNOSIS — O0993 Supervision of high risk pregnancy, unspecified, third trimester: Secondary | ICD-10-CM | POA: Insufficient documentation

## 2014-07-15 DIAGNOSIS — O09299 Supervision of pregnancy with other poor reproductive or obstetric history, unspecified trimester: Secondary | ICD-10-CM

## 2014-07-15 DIAGNOSIS — Z118 Encounter for screening for other infectious and parasitic diseases: Secondary | ICD-10-CM

## 2014-07-15 LAB — POCT URINALYSIS DIP (DEVICE)
Bilirubin Urine: NEGATIVE
GLUCOSE, UA: NEGATIVE mg/dL
KETONES UR: NEGATIVE mg/dL
Leukocytes, UA: NEGATIVE
NITRITE: NEGATIVE
PH: 6 (ref 5.0–8.0)
Protein, ur: NEGATIVE mg/dL
SPECIFIC GRAVITY, URINE: 1.025 (ref 1.005–1.030)
UROBILINOGEN UA: 0.2 mg/dL (ref 0.0–1.0)

## 2014-07-15 NOTE — Progress Notes (Signed)
Subjective:    Jenny Myers is a [id number] [redacted]w[redacted]d being seen today for her first obstetrical visit.  Her obstetrical history is significant for IUGR with first pregnancy adn 5 pounds 10 ounces with 41 week second pregnancy. Patient does intend to breast feed. Pregnancy history fully reviewed.  Patient reports no complaints.  PT'S DAD HAS BREAST CANCER-DID HAVE BRCA TEST AND SHE NEEDS TO FIND OUT THE RESULTS OF HIS TEST.  PT WANTS BTL.  WOULD SEND TO COUNSELING TO DISCUSS SALPINGECTOMY +/- OOPHORECTOMY   Filed Vitals:   07/15/14 0907  BP: 126/55  Pulse: 103  Temp: 98.3 F (36.8 C)  Weight: 107 lb 11.2 oz (48.852 kg)    HISTORY: OB History  Gravida Para Term Preterm AB SAB TAB Ectopic Multiple Living  $Remov'6 2 2 'PdFILt$ 0 $Remo'3 1 1 1 'hAGnh$ 0 2    # Outcome Date GA Lbr Len/2nd Weight Sex Delivery Anes PTL Lv  6 Current           5 TAB 01/14/14          4 Term 02/27/12 [redacted]w[redacted]d 09:31 / 00:05 5 lb 10 oz (2.55 kg) F Vag-Spont EPI  Y  3 Ectopic 2012             Comments: Salpingctomy  2 Term 2011 [redacted]w[redacted]d 04:00 3 lb 14 oz (1.758 kg) F Vag-Spont EPI  Y     Comments: Right ruptured ectopic, salpingctomy  1 SAB 2009             Past Medical History  Diagnosis Date  . Ectopic pregnancy 11/2010    Right salpingectomy  . Umbilical hernia    Past Surgical History  Procedure Laterality Date  . Salpingectomy  2012    left ovary  . Wisdom tooth extraction     Family History  Problem Relation Age of Onset  . Hypertension Mother   . Diabetes Father   . Hypertension Father   . Vision loss Paternal Grandmother   . Other Neg Hx      Exam    Uterus:   gravid, 22 weeks  Pelvic Exam:    Perineum: Hemorrhoids   Vulva: normal   Vagina:  normal mucosa   pH: n/a   Cervix: no bleeding following Pap and no lesions   Adnexa: normal adnexa   Bony Pelvis: average  System: Breast:  normal appearance, no masses or tenderness   Skin: normal coloration and turgor, no rashes    Neurologic: oriented, normal mood    Extremities: no deformities   HEENT sclera clear, anicteric, oropharynx clear, no lesions, neck supple with midline trachea, thyroid without masses and trachea midline   Mouth/Teeth mucous membranes moist, pharynx normal without lesions and dental hygiene good   Neck supple and no masses   Cardiovascular: regular rate and rhythm   Respiratory:  appears well, vitals normal, no respiratory distress, acyanotic, normal RR, chest clear, no wheezing, crepitations, rhonchi, normal symmetric air entry   Abdomen: soft, non-tender; bowel sounds normal; no masses,  no organomegaly   Urinary: urethral meatus normal      Assessment:    Pregnancy: [id number] Patient Active Problem List   Diagnosis Date Noted  . Normal delivery 02/28/2012  . Traumatic injury during pregnancy 02/01/2012  . UTI in pregnancy 12/07/2011  . Candida vaginitis 12/07/2011  . Threatened preterm labor 12/07/2011  . IUGR (intrauterine growth restriction) in prior pregnancy, pregnant 07/16/2011  . History of ectopic pregnancy  Plan:     Initial labs drawn. Prenatal vitamins. Problem list reviewed and updated. Genetic Screening discussed -- patient has history of Turner's syndrome with last cacy.  Will refer to genetics and NIPS  Ultrasound discussed; fetal survey: ordered.  Follow up in 4 weeks. Father history of breast cancer--find out his BRCA status and refer to genetic counseling for testing if positive. SW Nutrition--underweight History of IUGR--serial [place]   Snyder Colavito H. 07/15/2014

## 2014-07-15 NOTE — Progress Notes (Signed)
Nutrition note: 1st visit consult Pt has gained 12.7# @ 8370w2d, which is wnl. Pt reports eating 5-6 x/d. Pt reports having some nausea but no heartburn. Pt is taking a PNV. Pt received verbal & written education on general nutrition during pregnancy. Discussed tips to decrease nausea. Discussed wt gain goals of 25-35# or 1#/ wk. Pt agrees to continue taking PNV. Pt does not have WIC but plans to apply. Pt plans to BF. F/u as needed [name], MS, RD, LDN, [facility]

## 2014-07-15 NOTE — Progress Notes (Signed)
Initial prenatal care 1 hour glucose with initial prenatal labs Flu vaccine New OB educational material given Home Medicaid Form given

## 2014-07-15 NOTE — Progress Notes (Signed)
Anatomy U/S 07/22/14 @ 830a with Radiology.

## 2014-07-16 LAB — CULTURE, OB URINE

## 2014-07-16 LAB — GLUCOSE TOLERANCE, 1 HOUR (50G) W/O FASTING: Glucose, 1 Hour GTT: 128 mg/dL (ref 70–140)

## 2014-07-16 LAB — PRENATAL PROFILE (SOLSTAS)
Antibody Screen: NEGATIVE
BASOS ABS: 0 10*3/uL (ref 0.0–0.1)
BASOS PCT: 0 % (ref 0–1)
EOS ABS: 0 10*3/uL (ref 0.0–0.7)
Eosinophils Relative: 0 % (ref 0–5)
HCT: 34.2 % — ABNORMAL LOW (ref 36.0–46.0)
HIV 1&2 Ab, 4th Generation: NONREACTIVE
Hemoglobin: 11.4 g/dL — ABNORMAL LOW (ref 12.0–15.0)
Hepatitis B Surface Ag: NEGATIVE
Lymphocytes Relative: 23 % (ref 12–46)
Lymphs Abs: 1.8 10*3/uL (ref 0.7–4.0)
MCH: 30.4 pg (ref 26.0–34.0)
MCHC: 33.3 g/dL (ref 30.0–36.0)
MCV: 91.2 fL (ref 78.0–100.0)
MONO ABS: 0.5 10*3/uL (ref 0.1–1.0)
MPV: 9.4 fL (ref 8.6–12.4)
Monocytes Relative: 7 % (ref 3–12)
Neutro Abs: 5.4 10*3/uL (ref 1.7–7.7)
Neutrophils Relative %: 70 % (ref 43–77)
Platelets: 326 10*3/uL (ref 150–400)
RBC: 3.75 MIL/uL — AB (ref 3.87–5.11)
RDW: 14.2 % (ref 11.5–15.5)
Rh Type: POSITIVE
Rubella: 1.4 Index — ABNORMAL HIGH (ref <0.90)
WBC: 7.7 10*3/uL (ref 4.0–10.5)

## 2014-07-19 LAB — CYTOLOGY - PAP

## 2014-07-19 MED ORDER — PROMETHAZINE HCL 12.5 MG PO TABS: 12.5000 mg | ORAL_TABLET | Freq: Four times a day (QID) | ORAL | Status: DC | PRN

## 2014-07-19 MED ORDER — CYCLOBENZAPRINE HCL 5 MG PO TABS: 5.0000 mg | ORAL_TABLET | Freq: Three times a day (TID) | ORAL | Status: DC | PRN

## 2014-07-20 ENCOUNTER — Encounter: Payer: Self-pay | Admitting: Obstetrics & Gynecology

## 2014-07-20 ENCOUNTER — Encounter: Payer: Self-pay | Admitting: *Deleted

## 2014-07-20 ENCOUNTER — Telehealth: Payer: Self-pay

## 2014-07-20 DIAGNOSIS — A749 Chlamydial infection, unspecified: Secondary | ICD-10-CM

## 2014-07-20 DIAGNOSIS — O98812 Other maternal infectious and parasitic diseases complicating pregnancy, second trimester: Secondary | ICD-10-CM

## 2014-07-20 LAB — CANNABANOIDS (GC/LC/MS), URINE: THC-COOH (GC/LC/MS), ur confirm: 188 ng/mL — AB (ref <5)

## 2014-07-20 MED ORDER — AZITHROMYCIN 500 MG PO TABS: 1000.0000 mg | ORAL_TABLET | Freq: Once | ORAL | Status: DC

## 2014-07-20 NOTE — Telephone Encounter (Signed)
1gm Zithromax e-prescribed per protocol. Attempted to contact patient-- number invalid. Called [facility]-- states patient is not there, however, confirms that number we have listed for patient is correct. Asked that he give her the message that the clinic is calling with results and that she call clinic at her earliest convenience-- he stated he would. [facility]

## 2014-07-20 NOTE — Progress Notes (Signed)
Pt came into the clinic to retrieve results, results given with directions for medication and precautions to prevent reinfection.  Pt verbalizes understanding.

## 2014-07-20 NOTE — Telephone Encounter (Signed)
-----   Message from Lesly DukesKelly H Leggett, MD sent at 07/20/2014  6:20 AM EST ----- Chlamydia is positive on pap smear.  Please treat per protocol.

## 2014-07-21 ENCOUNTER — Encounter: Payer: Self-pay | Admitting: Obstetrics & Gynecology

## 2014-07-21 DIAGNOSIS — F1210 Cannabis abuse, uncomplicated: Secondary | ICD-10-CM | POA: Insufficient documentation

## 2014-07-21 LAB — PRESCRIPTION MONITORING PROFILE (19 PANEL)
AMPHETAMINE/METH: NEGATIVE ng/mL
BARBITURATE SCREEN, URINE: NEGATIVE ng/mL
BENZODIAZEPINE SCREEN, URINE: NEGATIVE ng/mL
Buprenorphine, Urine: NEGATIVE ng/mL
Carisoprodol, Urine: NEGATIVE ng/mL
Cocaine Metabolites: NEGATIVE ng/mL
Creatinine, Urine: 198.48 mg/dL (ref >20.0)
ECSTASY: NEGATIVE ng/mL
FENTANYL URINE: NEGATIVE ng/mL
METHADONE SCREEN, URINE: NEGATIVE ng/mL
Meperidine, Ur: NEGATIVE ng/mL
Methaqualone: NEGATIVE ng/mL
NITRITES URINE, INITIAL: NEGATIVE ug/mL
Opiate Screen, Urine: NEGATIVE ng/mL
Oxycodone Screen, Ur: NEGATIVE ng/mL
PH URINE, INITIAL: 6.2 pH (ref 4.5–8.9)
PHENCYCLIDINE, UR: NEGATIVE ng/mL
PROPOXYPHENE: NEGATIVE ng/mL
Tapentadol, urine: NEGATIVE ng/mL
Tramadol Scrn, Ur: NEGATIVE ng/mL
Zolpidem, Urine: NEGATIVE ng/mL

## 2014-07-22 ENCOUNTER — Ambulatory Visit (HOSPITAL_COMMUNITY)
Admission: RE | Admit: 2014-07-22 | Discharge: 2014-07-22 | Disposition: A | Discharge status: Home / Self Care | Payer: Medicaid Other | Source: Ambulatory Visit | Attending: Obstetrics & Gynecology | Admitting: Obstetrics & Gynecology

## 2014-07-22 ENCOUNTER — Other Ambulatory Visit: Payer: Self-pay | Admitting: Obstetrics & Gynecology

## 2014-07-22 DIAGNOSIS — Z3689 Encounter for other specified antenatal screening: Secondary | ICD-10-CM | POA: Insufficient documentation

## 2014-07-22 DIAGNOSIS — Z3492 Encounter for supervision of normal pregnancy, unspecified, second trimester: Secondary | ICD-10-CM | POA: Diagnosis present

## 2014-07-22 DIAGNOSIS — Z3A23 23 weeks gestation of pregnancy: Secondary | ICD-10-CM | POA: Insufficient documentation

## 2014-07-23 NOTE — Telephone Encounter (Signed)
Pt came into the office, results given with instructions for how to take medication that was sent to her pharmacy and to abstain from sexual relations until partner/parnters have been treated. Pt verbalizes understanding.

## 2014-08-12 ENCOUNTER — Ambulatory Visit (INDEPENDENT_AMBULATORY_CARE_PROVIDER_SITE_OTHER): Payer: Medicaid Other | Admitting: Family Medicine

## 2014-08-12 VITALS — BP 109/58 | HR 89 | Temp 98.0°F | Wt 111.2 lb

## 2014-08-12 DIAGNOSIS — O0992 Supervision of high risk pregnancy, unspecified, second trimester: Secondary | ICD-10-CM

## 2014-08-12 LAB — POCT URINALYSIS DIP (DEVICE)
Bilirubin Urine: NEGATIVE
Glucose, UA: NEGATIVE mg/dL
KETONES UR: NEGATIVE mg/dL
Leukocytes, UA: NEGATIVE
Nitrite: NEGATIVE
PH: 6.5 (ref 5.0–8.0)
PROTEIN: NEGATIVE mg/dL
UROBILINOGEN UA: 0.2 mg/dL (ref 0.0–1.0)

## 2014-08-12 NOTE — Progress Notes (Signed)
Patient is 24 y.o. [id number] 7415w2d.  +FM, denies LOF, VB, vaginal discharge.  Has occasional contractions, up to 5 in 1 day, but infrequent and no more than 1/hour.  Improve with rest/PO hydration.

## 2014-08-20 ENCOUNTER — Encounter: Payer: Self-pay | Admitting: *Deleted

## 2014-09-02 ENCOUNTER — Encounter (HOSPITAL_COMMUNITY): Payer: Self-pay | Admitting: *Deleted

## 2014-09-02 ENCOUNTER — Ambulatory Visit (INDEPENDENT_AMBULATORY_CARE_PROVIDER_SITE_OTHER): Payer: Medicaid Other | Admitting: Family Medicine

## 2014-09-02 ENCOUNTER — Inpatient Hospital Stay (HOSPITAL_COMMUNITY)
Admission: AD | Admit: 2014-09-02 | Discharge: 2014-09-02 | Disposition: A | Discharge status: Home / Self Care | Payer: Medicaid Other | Source: Ambulatory Visit | Attending: Family Medicine | Admitting: Family Medicine

## 2014-09-02 VITALS — BP 111/60 | HR 79 | Temp 98.3°F | Wt 115.9 lb

## 2014-09-02 DIAGNOSIS — O0993 Supervision of high risk pregnancy, unspecified, third trimester: Secondary | ICD-10-CM

## 2014-09-02 DIAGNOSIS — Z202 Contact with and (suspected) exposure to infections with a predominantly sexual mode of transmission: Secondary | ICD-10-CM

## 2014-09-02 DIAGNOSIS — N898 Other specified noninflammatory disorders of vagina: Secondary | ICD-10-CM | POA: Diagnosis not present

## 2014-09-02 DIAGNOSIS — Z3A29 29 weeks gestation of pregnancy: Secondary | ICD-10-CM | POA: Insufficient documentation

## 2014-09-02 DIAGNOSIS — O09293 Supervision of pregnancy with other poor reproductive or obstetric history, third trimester: Secondary | ICD-10-CM

## 2014-09-02 DIAGNOSIS — Z87891 Personal history of nicotine dependence: Secondary | ICD-10-CM | POA: Diagnosis not present

## 2014-09-02 DIAGNOSIS — O0992 Supervision of high risk pregnancy, unspecified, second trimester: Secondary | ICD-10-CM

## 2014-09-02 HISTORY — DX: Unspecified infectious disease: B99.9

## 2014-09-02 LAB — POCT URINALYSIS DIP (DEVICE)
Bilirubin Urine: NEGATIVE
GLUCOSE, UA: NEGATIVE mg/dL
Hgb urine dipstick: NEGATIVE
Ketones, ur: NEGATIVE mg/dL
Leukocytes, UA: NEGATIVE
Nitrite: NEGATIVE
Protein, ur: 30 mg/dL — AB
Specific Gravity, Urine: 1.02 (ref 1.005–1.030)
Urobilinogen, UA: 2 mg/dL — ABNORMAL HIGH (ref 0.0–1.0)
pH: 7 (ref 5.0–8.0)

## 2014-09-02 LAB — FETAL FIBRONECTIN: FETAL FIBRONECTIN: NEGATIVE

## 2014-09-02 MED ORDER — LACTATED RINGERS IV BOLUS (SEPSIS)
1000.0000 mL | Freq: Once | INTRAVENOUS | Status: AC
  Administered 2014-09-02: 1000 mL via INTRAVENOUS

## 2014-09-02 MED ORDER — NIFEDIPINE ER 30 MG PO TB24: 30.0000 mg | ORAL_TABLET | Freq: Every day | ORAL | Status: DC

## 2014-09-02 MED ORDER — NIFEDIPINE 10 MG PO CAPS
20.0000 mg | ORAL_CAPSULE | Freq: Once | ORAL | Status: AC
  Administered 2014-09-02: 20 mg via ORAL
  Filled 2014-09-02: qty 2

## 2014-09-02 MED ORDER — OXYCODONE-ACETAMINOPHEN 5-325 MG PO TABS: 1.0000 | ORAL_TABLET | Freq: Once | ORAL | Status: DC

## 2014-09-02 MED ORDER — NIFEDIPINE ER OSMOTIC RELEASE 30 MG PO TB24: 30.0000 mg | ORAL_TABLET | Freq: Every day | ORAL | Status: DC

## 2014-09-02 MED ORDER — NIFEDIPINE 10 MG PO CAPS
10.0000 mg | ORAL_CAPSULE | ORAL | Status: DC | PRN
  Filled 2014-09-02: qty 1

## 2014-09-02 NOTE — Progress Notes (Signed)
Intense contractions.  FFN collected.  Will send to MAU.  Recollected GC/CT.

## 2014-09-02 NOTE — MAU Note (Signed)
Stats has been contracting since Monday. ? Leaking on Monday- eval in clinic, not ROM. No bleeding.

## 2014-09-02 NOTE — Addendum Note (Signed)
Addended by: Candelaria StagersHAIZLIP, Lilla Callejo E on: 09/02/2014 02:22 PM   Modules accepted: Orders

## 2014-09-02 NOTE — MAU Note (Signed)
Sent from clinic, ? Preterm labor. fFN sent clinic

## 2014-09-02 NOTE — Discharge Instructions (Signed)

## 2014-09-02 NOTE — MAU Provider Note (Signed)
History     CSN: [id number]  Arrival date and time: 09/02/14 1010   First Provider Initiated Contact with Patient 09/02/14 1112      Chief Complaint  Patient presents with  . Contractions   HPI [name] is a 23yo [id number] at 29weeks and 2 days presenting in pre-term labor. Initially presented to clinic and was instructed to go to MAU for further evaluation. States she has been contracting since Monday. Contractions every 10 minutes. Pain located in pelvis and back, without upper abdominal pain. Also notes leaking on Monday and was evaluated, but it was determined she did not rupture her membranes. Denies vaginal bleeding. Notes "white mucousy" vaginal discharge. Continues to note fetal movement. Cervical exam at clinic today showed 0.5/50/-3. OB History    Gravida Para Term Preterm AB TAB SAB Ectopic Multiple Living   6 2 2  0 3 1 1 1  0 2      Past Medical History  Diagnosis Date  . Ectopic pregnancy 11/2010    Right salpingectomy  . Umbilical hernia   . Infection     chlamydia    Past Surgical History  Procedure Laterality Date  . Salpingectomy  2012    left ovary, ruptured ectopic  . Wisdom tooth extraction    . Induced abortion      Family History  Problem Relation Age of Onset  . Hypertension Mother   . Diabetes Father   . Hypertension Father   . Cancer Father     breast  . Vision loss Paternal Grandmother   . Other Neg Hx     History  Substance Use Topics  . Smoking status: Former Games developermoker  . Smokeless tobacco: Never Used     Comment: quit with preg  . Alcohol Use: No     Comment: not since pregnancy    Allergies:  Allergies  Allergen Reactions  . Garlic Anaphylaxis  . Penicillins Anaphylaxis and Nausea And Vomiting  . Carrot Flavor Hives    Prescriptions prior to admission  Medication Sig Dispense Refill Last Dose  . acetaminophen (TYLENOL) 325 MG tablet Take 650 mg by mouth every 6 (six) hours as needed for mild pain.    09/01/2014 at Unknown  time  . cyclobenzaprine (FLEXERIL) 5 MG tablet Take 1 tablet (5 mg total) by mouth 3 (three) times daily as needed for muscle spasms. 15 tablet 0 Past Week at Unknown time  . Prenatal Vit-Fe Fumarate-FA (PRENATAL MULTIVITAMIN) TABS tablet Take 1 tablet by mouth daily at 12 noon.   09/02/2014 at Unknown time  . promethazine (PHENERGAN) 12.5 MG tablet Take 1 tablet (12.5 mg total) by mouth every 6 (six) hours as needed for nausea or vomiting. 30 tablet 0 09/01/2014 at Unknown time    Review of Systems  Neurological: Positive for dizziness and headaches.   Physical Exam   Blood pressure 109/60, pulse 72, temperature 98.2 F (36.8 C), temperature source Oral, resp. rate 18, last menstrual period 04/06/2014.  Physical Exam  Constitutional: She appears well-developed and well-nourished. No distress.  Cardiovascular: Normal rate.  Exam reveals no gallop and no friction rub.   No murmur heard. Respiratory: Effort normal. No respiratory distress. She has no wheezes. She has no rales.  GI: Soft. There is no tenderness.  Musculoskeletal: She exhibits no edema.  Pain palpated over pubic bone and round ligaments bilaterally  Skin: Skin is warm. No rash noted. No erythema.  Psychiatric: She has a normal mood and affect. Her behavior is normal.  MAU Course  Procedures  MDM Fetal Monitor: baseline 150bpm, moderate variability, + accelerations, note period of time where mother's HR was being picked up instead of baby's Fetal Fibronectin negative Procardia  given. Procardia XL  given prior to discharge. 1L LR bolus Cervical exam unchanged  Assessment and Plan  Stable for discharge. Return precautions given. Prescription for Procardia  daily given  Araceli Bouche 09/02/2014, 11:32 AM    OB fellow attestation:  I have seen and examined this patient; I agree with above documentation in the resident's note.   Jenny Myers is a 24 y.o. [id number] reporting for evaluation of  preterm labor, sent from clinic with FFN +FM, denies LOF, VB, vaginal discharge.  PE: BP 114/45 mmHg  Pulse 116  Temp(Src) 98.2 F (36.8 C) (Oral)  Resp 18  LMP 04/06/2014 Gen: calm comfortable, NAD Resp: normal effort, no distress Abd: gravid  ROS, labs, PMH reviewed NST reactive, rare contractions FFN neg 1L bolus, procardia  then XL given. No cervical change Dilation: Fingertip Effacement (%): 50 Station: -3 Exam by:: Dr. Loreta Ave   Plan: - fetal kick counts reinforced, preterm labor precautions - continue routine follow up in OB clinic - FFN neg, cervical exam reassuring, rx procardia  XL  Perry Mount, MD 4:58 PM

## 2014-09-02 NOTE — Progress Notes (Signed)
Pt complains of intense contractions all week, desires cervical exam Repeat GC

## 2014-09-03 LAB — GC/CHLAMYDIA PROBE AMP
CT Probe RNA: NEGATIVE
GC PROBE AMP APTIMA: NEGATIVE

## 2014-09-16 ENCOUNTER — Ambulatory Visit (INDEPENDENT_AMBULATORY_CARE_PROVIDER_SITE_OTHER): Payer: Medicaid Other | Admitting: Obstetrics and Gynecology

## 2014-09-16 VITALS — BP 114/65 | HR 75 | Temp 98.1°F | Wt 115.9 lb

## 2014-09-16 DIAGNOSIS — O09293 Supervision of pregnancy with other poor reproductive or obstetric history, third trimester: Secondary | ICD-10-CM

## 2014-09-16 LAB — POCT URINALYSIS DIP (DEVICE)
Glucose, UA: NEGATIVE mg/dL
Hgb urine dipstick: NEGATIVE
Ketones, ur: NEGATIVE mg/dL
LEUKOCYTES UA: NEGATIVE
Nitrite: NEGATIVE
PH: 7 (ref 5.0–8.0)
PROTEIN: 30 mg/dL — AB
Specific Gravity, Urine: 1.02 (ref 1.005–1.030)
Urobilinogen, UA: 4 mg/dL — ABNORMAL HIGH (ref 0.0–1.0)

## 2014-09-16 NOTE — Patient Instructions (Signed)
Third Trimester of Pregnancy The third trimester is from week 29 through week 42, months 7 through 9. The third trimester is a time when the fetus is growing rapidly. At the end of the ninth month, the fetus is about 20 inches in length and weighs 6-10 pounds.  BODY CHANGES Your body goes through many changes during pregnancy. The changes vary from woman to woman.   Your weight will continue to increase. You can expect to gain 25-35 pounds (11-16 kg) by the end of the pregnancy.  You may begin to get stretch marks on your hips, abdomen, and breasts.  You may urinate more often because the fetus is moving lower into your pelvis and pressing on your bladder.  You may develop or continue to have heartburn as a result of your pregnancy.  You may develop constipation because certain hormones are causing the muscles that push waste through your intestines to slow down.  You may develop hemorrhoids or swollen, bulging veins (varicose veins).  You may have pelvic pain because of the weight gain and pregnancy hormones relaxing your joints between the bones in your pelvis. Backaches may result from overexertion of the muscles supporting your posture.  You may have changes in your hair. These can include thickening of your hair, rapid growth, and changes in texture. Some women also have hair loss during or after pregnancy, or hair that feels dry or thin. Your hair will most likely return to normal after your baby is born.  Your breasts will continue to grow and be tender. A yellow discharge may leak from your breasts called colostrum.  Your belly button may stick out.  You may feel short of breath because of your expanding uterus.  You may notice the fetus "dropping," or moving lower in your abdomen.  You may have a bloody mucus discharge. This usually occurs a few days to a week before labor begins.  Your cervix becomes thin and soft (effaced) near your due date. WHAT TO EXPECT AT YOUR PRENATAL  EXAMS  You will have prenatal exams every 2 weeks until week 36. Then, you will have weekly prenatal exams. During a routine prenatal visit:  You will be weighed to make sure you and the fetus are growing normally.  Your blood pressure is taken.  Your abdomen will be measured to track your baby's growth.  The fetal heartbeat will be listened to.  Any test results from the previous visit will be discussed.  You may have a cervical check near your due date to see if you have effaced. At around 36 weeks, your caregiver will check your cervix. At the same time, your caregiver will also perform a test on the secretions of the vaginal tissue. This test is to determine if a type of bacteria, Group B streptococcus, is present. Your caregiver will explain this further. Your caregiver may ask you:  What your birth plan is.  How you are feeling.  If you are feeling the baby move.  If you have had any abnormal symptoms, such as leaking fluid, bleeding, severe headaches, or abdominal cramping.  If you have any questions. Other tests or screenings that may be performed during your third trimester include:  Blood tests that check for low iron levels (anemia).  Fetal testing to check the health, activity level, and growth of the fetus. Testing is done if you have certain medical conditions or if there are problems during the pregnancy. FALSE LABOR You may feel small, irregular contractions that   eventually go away. These are called Braxton Hicks contractions, or false labor. Contractions may last for hours, days, or even weeks before true labor sets in. If contractions come at regular intervals, intensify, or become painful, it is best to be seen by your caregiver.  SIGNS OF LABOR   Menstrual-like cramps.  Contractions that are 5 minutes apart or less.  Contractions that start on the top of the uterus and spread down to the lower abdomen and back.  A sense of increased pelvic pressure or back  pain.  A watery or bloody mucus discharge that comes from the vagina. If you have any of these signs before the 37th week of pregnancy, call your caregiver right away. You need to go to the hospital to get checked immediately. HOME CARE INSTRUCTIONS   Avoid all smoking, herbs, alcohol, and unprescribed drugs. These chemicals affect the formation and growth of the baby.  Follow your caregiver's instructions regarding medicine use. There are medicines that are either safe or unsafe to take during pregnancy.  Exercise only as directed by your caregiver. Experiencing uterine cramps is a good sign to stop exercising.  Continue to eat regular, healthy meals.  Wear a good support bra for breast tenderness.  Do not use hot tubs, steam rooms, or saunas.  Wear your seat belt at all times when driving.  Avoid raw meat, uncooked cheese, cat litter boxes, and soil used by cats. These carry germs that can cause birth defects in the baby.  Take your prenatal vitamins.  Try taking a stool softener (if your caregiver approves) if you develop constipation. Eat more high-fiber foods, such as fresh vegetables or fruit and whole grains. Drink plenty of fluids to keep your urine clear or pale yellow.  Take warm sitz baths to soothe any pain or discomfort caused by hemorrhoids. Use hemorrhoid cream if your caregiver approves.  If you develop varicose veins, wear support hose. Elevate your feet for 15 minutes, 3-4 times a day. Limit salt in your diet.  Avoid heavy lifting, wear low heal shoes, and practice good posture.  Rest a lot with your legs elevated if you have leg cramps or low back pain.  Visit your dentist if you have not gone during your pregnancy. Use a soft toothbrush to brush your teeth and be gentle when you floss.  A sexual relationship may be continued unless your caregiver directs you otherwise.  Do not travel far distances unless it is absolutely necessary and only with the approval  of your caregiver.  Take prenatal classes to understand, practice, and ask questions about the labor and delivery.  Make a trial run to the hospital.  Pack your hospital bag.  Prepare the baby's nursery.  Continue to go to all your prenatal visits as directed by your caregiver. SEEK MEDICAL CARE IF:  You are unsure if you are in labor or if your water has broken.  You have dizziness.  You have mild pelvic cramps, pelvic pressure, or nagging pain in your abdominal area.  You have persistent nausea, vomiting, or diarrhea.  You have a bad smelling vaginal discharge.  You have pain with urination. SEEK IMMEDIATE MEDICAL CARE IF:   You have a fever.  You are leaking fluid from your vagina.  You have spotting or bleeding from your vagina.  You have severe abdominal cramping or pain.  You have rapid weight loss or gain.  You have shortness of breath with chest pain.  You notice sudden or extreme swelling   of your face, hands, ankles, feet, or legs.  You have not felt your baby move in over an hour.  You have severe headaches that do not go away with medicine.  You have vision changes. Document Released: 05/29/2001 Document Revised: 06/09/2013 Document Reviewed: 08/05/2012 Exit[employer] Patient Information 2015 [employer]. This information is not intended to replace advice given to you by your health care provider. Make sure you discuss any questions you have with your health care provider.  

## 2014-09-16 NOTE — Progress Notes (Signed)
On Procardia x 2 wks for threatened PTL. Reports 1-2 UCs/ hr and pelvic pressure. States at 2300 yesterday had gush of fluid vs urine. Did not recur. No dysuria, urgency, hematuria but has frequency. Cult sent. SSE: physiologic d/c. WP sent. Pool neg, fern neg. SVE then done.  Schedule US for growth.  PTL precautions. FM awareness.

## 2014-09-16 NOTE — Progress Notes (Signed)
U/S 09/22/14 @ 845a with Radiology.

## 2014-09-16 NOTE — Progress Notes (Signed)
Patient reports large amount of watery d/c, no itch or odor, unsure if water broke or not

## 2014-09-16 NOTE — Progress Notes (Signed)
Small bilirubin and elevated urobilinogen in urine.

## 2014-09-17 ENCOUNTER — Telehealth: Payer: Self-pay

## 2014-09-17 ENCOUNTER — Other Ambulatory Visit: Payer: Self-pay | Admitting: Medical

## 2014-09-17 DIAGNOSIS — N760 Acute vaginitis: Principal | ICD-10-CM

## 2014-09-17 DIAGNOSIS — B9689 Other specified bacterial agents as the cause of diseases classified elsewhere: Secondary | ICD-10-CM

## 2014-09-17 LAB — WET PREP, GENITAL
TRICH WET PREP: NONE SEEN
Yeast Wet Prep HPF POC: NONE SEEN

## 2014-09-17 MED ORDER — CYCLOBENZAPRINE HCL 5 MG PO TABS: 5.0000 mg | ORAL_TABLET | Freq: Three times a day (TID) | ORAL | Status: DC | PRN

## 2014-09-17 MED ORDER — METRONIDAZOLE 500 MG PO TABS: 500.0000 mg | ORAL_TABLET | Freq: Two times a day (BID) | ORAL | Status: DC

## 2014-09-17 NOTE — Telephone Encounter (Signed)
-----   Message from Danae Orleanseirdre C Poe, CNM sent at 09/17/2014 10:57 AM EDT ----- Please call in Flagyl 500 bid x7 for BV

## 2014-09-17 NOTE — Telephone Encounter (Signed)
Flagyl 500mg  BID X 7 days eprescribed per protocol. Called patient and informed. Patient verbalized understanding. No questions or concerns.

## 2014-09-17 NOTE — Telephone Encounter (Signed)
Refill approved by Dr. Adrian BlackwaterStinson. Medication e-prescribed.

## 2014-09-22 ENCOUNTER — Ambulatory Visit (HOSPITAL_COMMUNITY)
Admission: RE | Admit: 2014-09-22 | Discharge: 2014-09-22 | Disposition: A | Discharge status: Home / Self Care | Payer: Medicaid Other | Source: Ambulatory Visit | Attending: Obstetrics and Gynecology | Admitting: Obstetrics and Gynecology

## 2014-09-22 DIAGNOSIS — Z36 Encounter for antenatal screening of mother: Secondary | ICD-10-CM | POA: Insufficient documentation

## 2014-09-22 DIAGNOSIS — O09213 Supervision of pregnancy with history of pre-term labor, third trimester: Secondary | ICD-10-CM | POA: Insufficient documentation

## 2014-09-22 DIAGNOSIS — Z3A32 32 weeks gestation of pregnancy: Secondary | ICD-10-CM | POA: Diagnosis not present

## 2014-09-22 DIAGNOSIS — O09293 Supervision of pregnancy with other poor reproductive or obstetric history, third trimester: Secondary | ICD-10-CM | POA: Diagnosis not present

## 2014-09-30 ENCOUNTER — Ambulatory Visit (INDEPENDENT_AMBULATORY_CARE_PROVIDER_SITE_OTHER): Payer: Medicaid Other | Admitting: Obstetrics & Gynecology

## 2014-09-30 VITALS — BP 120/67 | HR 79 | Temp 98.2°F | Wt 119.3 lb

## 2014-09-30 DIAGNOSIS — O09292 Supervision of pregnancy with other poor reproductive or obstetric history, second trimester: Secondary | ICD-10-CM

## 2014-09-30 DIAGNOSIS — O0993 Supervision of high risk pregnancy, unspecified, third trimester: Secondary | ICD-10-CM

## 2014-09-30 DIAGNOSIS — O0992 Supervision of high risk pregnancy, unspecified, second trimester: Secondary | ICD-10-CM

## 2014-09-30 DIAGNOSIS — O09293 Supervision of pregnancy with other poor reproductive or obstetric history, third trimester: Secondary | ICD-10-CM

## 2014-09-30 LAB — POCT URINALYSIS DIP (DEVICE)
Bilirubin Urine: NEGATIVE
GLUCOSE, UA: NEGATIVE mg/dL
Leukocytes, UA: NEGATIVE
NITRITE: NEGATIVE
PROTEIN: 30 mg/dL — AB
SPECIFIC GRAVITY, URINE: 1.02 (ref 1.005–1.030)
UROBILINOGEN UA: 1 mg/dL (ref 0.0–1.0)
pH: 8.5 — ABNORMAL HIGH (ref 5.0–8.0)

## 2014-09-30 MED ORDER — TETANUS-DIPHTH-ACELL PERTUSSIS 5-2.5-18.5 LF-MCG/0.5 IM SUSP
0.5000 mL | Freq: Once | INTRAMUSCULAR | Status: AC
  Administered 2014-09-30: 0.5 mL via INTRAMUSCULAR

## 2014-09-30 NOTE — Patient Instructions (Signed)
Return to clinic for any obstetric concerns or go to MAU for evaluation  

## 2014-09-30 NOTE — Progress Notes (Signed)
UA resulted trace blood and ketones

## 2014-09-30 NOTE — Progress Notes (Signed)
09/22/14 scan showed EFW 39%, normal AFV. Will follow up with scans as clinically indicated No other complaints or concerns.  Labor and fetal movement precautions reviewed. Tdap today, third trimester labs tomorrow.

## 2014-09-30 NOTE — Progress Notes (Signed)
Has not been able to get flagyl for financial reasons.  C/o intermittent pelvic pain/pressure and occasional contractions.  Has not had 28 week labs-- had early 1hr gtt at 1351w2d. Needs 1hrgtt CBC, RPR, HIV. Patient states she cannot stay today but will come in tomorrow at 0830 forlab visit only.

## 2014-10-01 ENCOUNTER — Telehealth: Payer: Self-pay | Admitting: *Deleted

## 2014-10-01 ENCOUNTER — Encounter: Payer: Self-pay | Admitting: *Deleted

## 2014-10-01 ENCOUNTER — Other Ambulatory Visit: Payer: Medicaid Other

## 2014-10-01 NOTE — Telephone Encounter (Signed)
Attempted to contact patient concerning missed appointment, no answer, unable to leave a message.  Will send letter.

## 2014-10-14 ENCOUNTER — Ambulatory Visit (INDEPENDENT_AMBULATORY_CARE_PROVIDER_SITE_OTHER): Payer: Medicaid Other | Admitting: Family Medicine

## 2014-10-14 ENCOUNTER — Other Ambulatory Visit: Payer: Medicaid Other

## 2014-10-14 ENCOUNTER — Encounter: Payer: Medicaid Other | Admitting: Family Medicine

## 2014-10-14 VITALS — BP 122/70 | HR 70 | Wt 122.4 lb

## 2014-10-14 DIAGNOSIS — O0992 Supervision of high risk pregnancy, unspecified, second trimester: Secondary | ICD-10-CM

## 2014-10-14 DIAGNOSIS — Z803 Family history of malignant neoplasm of breast: Secondary | ICD-10-CM

## 2014-10-14 DIAGNOSIS — Z808 Family history of malignant neoplasm of other organs or systems: Secondary | ICD-10-CM

## 2014-10-14 LAB — POCT URINALYSIS DIP (DEVICE)
GLUCOSE, UA: NEGATIVE mg/dL
Hgb urine dipstick: NEGATIVE
Ketones, ur: NEGATIVE mg/dL
Leukocytes, UA: NEGATIVE
Nitrite: NEGATIVE
PROTEIN: 100 mg/dL — AB
SPECIFIC GRAVITY, URINE: 1.015 (ref 1.005–1.030)
Urobilinogen, UA: 2 mg/dL — ABNORMAL HIGH (ref 0.0–1.0)
pH: 8.5 — ABNORMAL HIGH (ref 5.0–8.0)

## 2014-10-14 LAB — CBC
HCT: 30.3 % — ABNORMAL LOW (ref 36.0–46.0)
HEMOGLOBIN: 9.9 g/dL — AB (ref 12.0–15.0)
MCH: 28.4 pg (ref 26.0–34.0)
MCHC: 32.7 g/dL (ref 30.0–36.0)
MCV: 86.8 fL (ref 78.0–100.0)
MPV: 9.5 fL (ref 8.6–12.4)
Platelets: 268 10*3/uL (ref 150–400)
RBC: 3.49 MIL/uL — AB (ref 3.87–5.11)
RDW: 14.5 % (ref 11.5–15.5)
WBC: 8.1 10*3/uL (ref 4.0–10.5)

## 2014-10-14 LAB — RPR

## 2014-10-14 NOTE — Progress Notes (Signed)
Patient is 24 y.o. [id number] 7463w2d.  +FM, denies LOF, VB, vaginal discharge.  Overall feeling well. - some contractions, up to hourly, lasting 30seconds - still has not had 1h gtt, states cannot stay for it, reports she will come back for 1h gtt

## 2014-10-15 LAB — HIV ANTIBODY (ROUTINE TESTING W REFLEX): HIV: NONREACTIVE

## 2014-10-15 LAB — GLUCOSE TOLERANCE, 1 HOUR (50G) W/O FASTING: Glucose, 1 Hour GTT: 130 mg/dL (ref 70–140)

## 2014-10-18 ENCOUNTER — Encounter (HOSPITAL_COMMUNITY): Payer: Self-pay | Admitting: *Deleted

## 2014-10-18 ENCOUNTER — Inpatient Hospital Stay (HOSPITAL_COMMUNITY)
Admission: AD | Admit: 2014-10-18 | Discharge: 2014-10-18 | Disposition: A | Discharge status: Home / Self Care | Payer: Medicaid Other | Source: Ambulatory Visit | Attending: Family Medicine | Admitting: Family Medicine

## 2014-10-18 DIAGNOSIS — Z3A35 35 weeks gestation of pregnancy: Secondary | ICD-10-CM | POA: Insufficient documentation

## 2014-10-18 DIAGNOSIS — O212 Late vomiting of pregnancy: Secondary | ICD-10-CM | POA: Diagnosis present

## 2014-10-18 DIAGNOSIS — A084 Viral intestinal infection, unspecified: Secondary | ICD-10-CM

## 2014-10-18 DIAGNOSIS — O26899 Other specified pregnancy related conditions, unspecified trimester: Secondary | ICD-10-CM

## 2014-10-18 DIAGNOSIS — R109 Unspecified abdominal pain: Secondary | ICD-10-CM

## 2014-10-18 DIAGNOSIS — G44209 Tension-type headache, unspecified, not intractable: Secondary | ICD-10-CM

## 2014-10-18 LAB — URINALYSIS, ROUTINE W REFLEX MICROSCOPIC
BILIRUBIN URINE: NEGATIVE
Glucose, UA: NEGATIVE mg/dL
Hgb urine dipstick: NEGATIVE
KETONES UR: NEGATIVE mg/dL
Leukocytes, UA: NEGATIVE
Nitrite: NEGATIVE
Protein, ur: NEGATIVE mg/dL
Specific Gravity, Urine: 1.02 (ref 1.005–1.030)
Urobilinogen, UA: 0.2 mg/dL (ref 0.0–1.0)
pH: 7 (ref 5.0–8.0)

## 2014-10-18 MED ORDER — METOCLOPRAMIDE HCL 5 MG/ML IJ SOLN
10.0000 mg | Freq: Once | INTRAMUSCULAR | Status: AC
  Administered 2014-10-18: 10 mg via INTRAVENOUS
  Filled 2014-10-18: qty 2

## 2014-10-18 MED ORDER — LACTATED RINGERS IV BOLUS (SEPSIS)
1000.0000 mL | Freq: Once | INTRAVENOUS | Status: AC
  Administered 2014-10-18: 1000 mL via INTRAVENOUS

## 2014-10-18 MED ORDER — PROMETHAZINE HCL 25 MG PO TABS: 25.0000 mg | ORAL_TABLET | Freq: Four times a day (QID) | ORAL | Status: DC | PRN

## 2014-10-18 MED ORDER — PROMETHAZINE HCL 25 MG/ML IJ SOLN
12.5000 mg | Freq: Once | INTRAMUSCULAR | Status: AC
  Administered 2014-10-18: 12.5 mg via INTRAVENOUS
  Filled 2014-10-18: qty 1

## 2014-10-18 MED ORDER — BUTORPHANOL TARTRATE 1 MG/ML IJ SOLN
2.0000 mg | Freq: Once | INTRAMUSCULAR | Status: AC
Start: 2014-10-18 — End: 2014-10-18
  Administered 2014-10-18: 2 mg via INTRAVENOUS
  Filled 2014-10-18: qty 2

## 2014-10-18 MED ORDER — DEXAMETHASONE SODIUM PHOSPHATE 10 MG/ML IJ SOLN
10.0000 mg | Freq: Once | INTRAMUSCULAR | Status: AC
  Administered 2014-10-18: 10 mg via INTRAVENOUS
  Filled 2014-10-18: qty 1

## 2014-10-18 MED ORDER — RANITIDINE HCL 150 MG PO TABS
150.0000 mg | ORAL_TABLET | Freq: Two times a day (BID) | ORAL | Status: DC
Start: 2014-10-18 — End: 2014-12-10

## 2014-10-18 NOTE — Discharge Instructions (Signed)
Abdominal Pain During Pregnancy Belly (abdominal) pain is common during pregnancy. Most of the time, it is not a serious problem. Other times, it can be a sign that something is wrong with the pregnancy. Always tell your doctor if you have belly pain. HOME CARE Monitor your belly pain for any changes. The following actions may help you feel better:  Do not have sex (intercourse) or put anything in your vagina until you feel better.  Rest until your pain stops.  Drink clear fluids if you feel sick to your stomach (nauseous). Do not eat solid food until you feel better.  Only take medicine as told by your doctor.  Keep all doctor visits as told. GET HELP RIGHT AWAY IF:   You are bleeding, leaking fluid, or pieces of tissue come out of your vagina.  You have more pain or cramping.  You keep throwing up (vomiting).  You have pain when you pee (urinate) or have blood in your pee.  You have a fever.  You do not feel your baby moving as much.  You feel very weak or feel like passing out.  You have trouble breathing, with or without belly pain.  You have a very bad headache and belly pain.  You have fluid leaking from your vagina and belly pain.  You keep having watery poop (diarrhea).  Your belly pain does not go away after resting, or the pain gets worse. MAKE SURE YOU:   Understand these instructions.  Will watch your condition.  Will get help right away if you are not doing well or get worse. Document Released: 05/23/2009 Document Revised: 02/04/2013 Document Reviewed: 01/01/2013 [facility] Patient Information 2015 [place], [place]. This information is not intended to replace advice given to you by your health care provider. Make sure you discuss any questions you have with your health care provider.  Viral Gastroenteritis Viral gastroenteritis is also called stomach flu. This illness is caused by a certain type of germ (virus). It can cause sudden watery poop (diarrhea)  and throwing up (vomiting). This can cause you to lose body fluids (dehydration). This illness usually lasts for 3 to 8 days. It usually goes away on its own. HOME CARE   Drink enough fluids to keep your pee (urine) clear or pale yellow. Drink small amounts of fluids often.  Ask your doctor how to replace body fluid losses (rehydration).  Avoid:  Foods high in sugar.  Alcohol.  Bubbly (carbonated) drinks.  Tobacco.  Juice.  Caffeine drinks.  Very hot or cold fluids.  Fatty, greasy foods.  Eating too much at one time.  Dairy products until 24 to 48 hours after your watery poop stops.  You may eat foods with active cultures (probiotics). They can be found in some yogurts and supplements.  Wash your hands well to avoid spreading the illness.  Only take medicines as told by your doctor. Do not give aspirin to children. Do not take medicines for watery poop (antidiarrheals).  Ask your doctor if you should keep taking your regular medicines.  Keep all doctor visits as told. GET HELP RIGHT AWAY IF:   You cannot keep fluids down.  You do not pee at least once every 6 to 8 hours.  You are short of breath.  You see blood in your poop or throw up. This may look like coffee grounds.  You have belly (abdominal) pain that gets worse or is just in one small spot (localized).  You keep throwing up or having  watery poop.  You have a fever.  The patient is a child younger than 3 months, and he or she has a fever.  The patient is a child older than 3 months, and he or she has a fever and problems that do not go away.  The patient is a child older than 3 months, and he or she has a fever and problems that suddenly get worse.  The patient is a baby, and he or she has no tears when crying. MAKE SURE YOU:   Understand these instructions.  Will watch your condition.  Will get help right away if you are not doing well or get worse. Document Released: 11/21/2007 Document  Revised: 08/27/2011 Document Reviewed: 03/21/2011 [facility] Patient Information 2015 [place], [place]. This information is not intended to replace advice given to you by your health care provider. Make sure you discuss any questions you have with your health care provider.

## 2014-10-18 NOTE — MAU Note (Signed)
Pt says that the intermittent pain from earlier that coincided with constant abdominal pain is now gone. She rates the constant pain a 10 at this time. She says that she is still nauseous.

## 2014-10-18 NOTE — MAU Note (Signed)
Pt says she started contracting this morning at 450m Reports Contractions every 4 minutes. Denies bright red vaginal bleeding.  Positive fetal movement Denies SROM/LOF  Denies any infections/complications of pregnancy

## 2014-10-18 NOTE — MAU Provider Note (Signed)
History     CSN: [id number]  Arrival date and time: 10/18/14 1037   First Provider Initiated Contact with Patient 10/18/14 1200      Chief Complaint  Patient presents with  . Labor Eval   HPI Patient is 24 y.o. [id number] here with complaints of nausea, vomiting, cramping, diarrhea, headache. Patient reports that symptoms began this morning.  She states that nausea and vomiting started first then she developed a headache that is causing some blurry vision.  Denies h/o Migraine headache.  She reports that headache is throbbing in behind her eyes wrapping around her temples.  She reports at least 5 loose BMs since this morning.  She is not tolerating PO, as she immediately vomited after trying to eat this morning.  She endorses cramping in her upper and lower abdomen not relieved by BM.  She denies hematochezia, extreme foul odor with BM, hematuria, dysuria, urgency, fevers, chills, sick contacts, recent travel, undercooked foods or petting zoos. +FM, denies LOF, VB, contractions.  Endorses vaginal discharge that is white and frequency increased from baseline.  No foul odors, itching or burning.  OB History    Gravida Para Term Preterm AB TAB SAB Ectopic Multiple Living   6 2 2  0 3 1 1 1  0 2      Past Medical History  Diagnosis Date  . Ectopic pregnancy 11/2010    Right salpingectomy  . Umbilical hernia   . Infection     chlamydia    Past Surgical History  Procedure Laterality Date  . Salpingectomy  2012    left ovary, ruptured ectopic  . Wisdom tooth extraction    . Induced abortion      Family History  Problem Relation Age of Onset  . Hypertension Mother   . Diabetes Father   . Hypertension Father   . Cancer Father     breast  . Vision loss Paternal Grandmother   . Other Neg Hx     History  Substance Use Topics  . Smoking status: Former Games developermoker  . Smokeless tobacco: Never Used     Comment: quit with preg  . Alcohol Use: No     Comment: not since pregnancy     Allergies:  Allergies  Allergen Reactions  . Garlic Anaphylaxis  . Penicillins Anaphylaxis and Nausea And Vomiting  . Carrot Flavor Hives    Prescriptions prior to admission  Medication Sig Dispense Refill Last Dose  . acetaminophen (TYLENOL) 325 MG tablet Take 650 mg by mouth every 6 (six) hours as needed for mild pain.    Past Week at Unknown time  . Prenatal Vit-Fe Fumarate-FA (PRENATAL MULTIVITAMIN) TABS tablet Take 1 tablet by mouth daily at 12 noon.   10/17/2014 at Unknown time  . cyclobenzaprine (FLEXERIL) 5 MG tablet Take 1 tablet (5 mg total) by mouth 3 (three) times daily as needed for muscle spasms. (Patient not taking: Reported on 10/18/2014) 15 tablet 0 Taking  . metroNIDAZOLE (FLAGYL) 500 MG tablet Take 1 tablet (500 mg total) by mouth 2 (two) times daily. (Patient not taking: Reported on 09/30/2014) 14 tablet 0 Not Taking  . NIFEdipine (PROCARDIA-XL/ADALAT CC) 30 MG 24 hr tablet Take 1 tablet (30 mg total) by mouth daily. (Patient not taking: Reported on 09/30/2014) 30 tablet 0 Not Taking  . promethazine (PHENERGAN) 12.5 MG tablet Take 1 tablet (12.5 mg total) by mouth every 6 (six) hours as needed for nausea or vomiting. (Patient not taking: Reported on 09/30/2014) 30 tablet 0  Not Taking    Review of Systems  Constitutional: Negative for fever and chills.  HENT: Negative for congestion.   Eyes: Positive for blurred vision. Negative for double vision, photophobia and pain.  Respiratory: Negative for cough and shortness of breath.   Cardiovascular: Positive for chest pain (epigastric in nature. she feels like it is heartburn). Negative for palpitations and leg swelling.  Gastrointestinal: Positive for heartburn, nausea, vomiting, abdominal pain and diarrhea. Negative for blood in stool and melena.  Genitourinary: Positive for frequency (increased from baseline). Negative for dysuria, urgency, hematuria and flank pain.  Musculoskeletal: Negative for back pain and falls.  Skin:  Negative for itching and rash.  Neurological: Positive for headaches. Negative for dizziness.   Physical Exam   Blood pressure 121/66, pulse 78, temperature 97.8 F (36.6 C), temperature source Oral, resp. rate 18, last menstrual period 04/06/2014.  Physical Exam  Constitutional: She is oriented to person, place, and time. She appears well-developed and well-nourished. No distress (appears uncomfortable).  HENT:  Head: Normocephalic and atraumatic.  Eyes: EOM are normal. Pupils are equal, round, and reactive to light. No scleral icterus.  Neck: Normal range of motion. Neck supple.  Cardiovascular: Normal rate, regular rhythm, normal heart sounds and intact distal pulses.   Respiratory: Effort normal and breath sounds normal. No respiratory distress.  GI: Soft. There is tenderness (mild suprapubic TTP, mild-mod epigatric TTP, no peritoneal signs). There is no rebound and no guarding.  gravid  Musculoskeletal: Normal range of motion. She exhibits no edema or tenderness.  Neurological: She is alert and oriented to person, place, and time. No cranial nerve deficit.  EOMI, responds appropriately, follows commands  Skin: Skin is warm and dry. No rash noted. She is not diaphoretic.  Psychiatric: She has a normal mood and affect. Her behavior is normal. Judgment and thought content normal.   Dilation: 1 Cervical Position: Posterior Station: -3 Exam by:: Judie Petit RN   Fetal monitoringBaseline: 125 bpm, Variability: Good {> 6 bpm) and Accelerations: Reactive Uterine activity: irregularly/  MAU Course  Procedures  MDM NST Reactive LR bolus x2L Migraine cocktail Decadron 10, Reglan 10, Promethazine 12.5 Stadol x1  Assessment and Plan  Nausea and vomiting during pregnancy.  Likely 2/2 gastroenteritis vs reflux.  Responded well to Promethazine/Reglan -Promethazine  Rx'd -Ranitidine  rx'd -Encouraged PO as tolerated -Return precautions reviewed -F/u with OB as  scheduled  Headache. Resolved -Tylenol PRN headache  Work note provided to spouse.  Raliegh Ip, DO 10/18/2014 12:13PM   CNM attestation:  I have seen and examined this patient; I agree with above documentation in the resident's note.   Jenny Myers is a 24 y.o. [id number] reporting abd pain and H/A, and N/V +FM, denies LOF, VB, vaginal discharge.  PE: BP 114/68 mmHg  Pulse 93  Temp(Src) 97.8 F (36.6 C) (Oral)  Resp 18  LMP 04/06/2014 Gen: initially uncomfortable; after IVF/meds pt had to be wakened Resp: normal effort, no distress Abd: gravid, soft  ROS, labs, PMH reviewed NST reactive +accels, no decels Irreg ctx  Plan: - fetal kick counts reinforced, labor precautions - comfort meas and meds rx for N/V - continue routine follow up in OB clinic  Cam Hai, CNM 3:43 PM   10/18/2014, 12:13 PM

## 2014-10-19 LAB — CULTURE, OB URINE: Colony Count: 40000

## 2014-10-19 NOTE — Addendum Note (Signed)
Encounter addended by: Danae Orleanseirdre C Emmaly Leech, CNM on: 10/19/2014 11:31 AM<BR>     Documentation filed: Problem List, Message

## 2014-10-27 ENCOUNTER — Inpatient Hospital Stay (HOSPITAL_COMMUNITY)
Admission: AD | Admit: 2014-10-27 | Discharge: 2014-10-28 | Disposition: A | Discharge status: Home / Self Care | Payer: Medicaid Other | Source: Ambulatory Visit | Attending: Obstetrics & Gynecology | Admitting: Obstetrics & Gynecology

## 2014-10-27 DIAGNOSIS — Z3A37 37 weeks gestation of pregnancy: Secondary | ICD-10-CM | POA: Insufficient documentation

## 2014-10-28 ENCOUNTER — Encounter (HOSPITAL_COMMUNITY): Payer: Self-pay

## 2014-10-28 ENCOUNTER — Ambulatory Visit (INDEPENDENT_AMBULATORY_CARE_PROVIDER_SITE_OTHER): Payer: Medicaid Other | Admitting: Obstetrics & Gynecology

## 2014-10-28 ENCOUNTER — Encounter: Payer: Self-pay | Admitting: Obstetrics & Gynecology

## 2014-10-28 VITALS — BP 126/84 | HR 102 | Wt 124.1 lb

## 2014-10-28 DIAGNOSIS — Z3A37 37 weeks gestation of pregnancy: Secondary | ICD-10-CM | POA: Diagnosis not present

## 2014-10-28 DIAGNOSIS — O0993 Supervision of high risk pregnancy, unspecified, third trimester: Secondary | ICD-10-CM

## 2014-10-28 LAB — POCT URINALYSIS DIP (DEVICE)
Glucose, UA: NEGATIVE mg/dL
Ketones, ur: NEGATIVE mg/dL
LEUKOCYTES UA: NEGATIVE
Nitrite: NEGATIVE
PROTEIN: 30 mg/dL — AB
Specific Gravity, Urine: 1.025 (ref 1.005–1.030)
Urobilinogen, UA: 1 mg/dL (ref 0.0–1.0)
pH: 7 (ref 5.0–8.0)

## 2014-10-28 LAB — OB RESULTS CONSOLE GC/CHLAMYDIA
CHLAMYDIA, DNA PROBE: NEGATIVE
GC PROBE AMP, GENITAL: NEGATIVE

## 2014-10-28 LAB — OB RESULTS CONSOLE GBS: STREP GROUP B AG: NEGATIVE

## 2014-10-28 MED ORDER — LACTATED RINGERS IV BOLUS (SEPSIS)
1000.0000 mL | Freq: Once | INTRAVENOUS | Status: AC
  Administered 2014-10-28: 1000 mL via INTRAVENOUS

## 2014-10-28 MED ORDER — BUTORPHANOL TARTRATE 1 MG/ML IJ SOLN
2.0000 mg | Freq: Once | INTRAMUSCULAR | Status: AC
Start: 2014-10-28 — End: 2014-10-28
  Administered 2014-10-28: 2 mg via INTRAVENOUS
  Filled 2014-10-28: qty 2

## 2014-10-28 MED ORDER — PROMETHAZINE HCL 25 MG/ML IJ SOLN
25.0000 mg | Freq: Once | INTRAMUSCULAR | Status: AC
  Administered 2014-10-28: 12.5 mg via INTRAVENOUS
  Filled 2014-10-28: qty 1

## 2014-10-28 NOTE — Patient Instructions (Signed)

## 2014-10-28 NOTE — Progress Notes (Signed)
Patient at MAU last night for contractions 4 minutes apart; still contracting this morning

## 2014-10-28 NOTE — MAU Note (Signed)
Pt states she is contracting and has been leaking fluid since 2315

## 2014-10-28 NOTE — Progress Notes (Signed)
Clears vaginal discharge, no pool only mucus. Labor precautions given

## 2014-10-28 NOTE — Discharge Instructions (Signed)
Braxton Hicks Contractions °Contractions of the uterus can occur throughout pregnancy. Contractions are not always a sign that you are in labor.  °WHAT ARE BRAXTON HICKS CONTRACTIONS?  °Contractions that occur before labor are called Braxton Hicks contractions, or false labor. Toward the end of pregnancy (32-34 weeks), these contractions can develop more often and may become more forceful. This is not true labor because these contractions do not result in opening (dilatation) and thinning of the cervix. They are sometimes difficult to tell apart from true labor because these contractions can be forceful and people have different pain tolerances. You should not feel embarrassed if you go to the hospital with false labor. Sometimes, the only way to tell if you are in true labor is for your health care provider to look for changes in the cervix. °If there are no prenatal problems or other health problems associated with the pregnancy, it is completely safe to be sent home with false labor and await the onset of true labor. °HOW CAN YOU TELL THE DIFFERENCE BETWEEN TRUE AND FALSE LABOR? °False Labor °· The contractions of false labor are usually shorter and not as hard as those of true labor.   °· The contractions are usually irregular.   °· The contractions are often felt in the front of the lower abdomen and in the groin.   °· The contractions may go away when you walk around or change positions while lying down.   °· The contractions get weaker and are shorter lasting as time goes on.   °· The contractions do not usually become progressively stronger, regular, and closer together as with true labor.   °True Labor °· Contractions in true labor last 30-70 seconds, become very regular, usually become more intense, and increase in frequency.   °· The contractions do not go away with walking.   °· The discomfort is usually felt in the top of the uterus and spreads to the lower abdomen and low back.   °· True labor can be  determined by your health care provider with an exam. This will show that the cervix is dilating and getting thinner.   °WHAT TO REMEMBER °· Keep up with your usual exercises and follow other instructions given by your health care provider.   °· Take medicines as directed by your health care provider.   °· Keep your regular prenatal appointments.   °· Eat and drink lightly if you think you are going into labor.   °· If Braxton Hicks contractions are making you uncomfortable:   °¨ Change your position from lying down or resting to walking, or from walking to resting.   °¨ Sit and rest in a tub of warm water.   °¨ Drink 2-3 glasses of water. Dehydration may cause these contractions.   °¨ Do slow and deep breathing several times an hour.   °WHEN SHOULD I SEEK IMMEDIATE MEDICAL CARE? °Seek immediate medical care if: °· Your contractions become stronger, more regular, and closer together.   °· You have fluid leaking or gushing from your vagina.   °· You have a fever.   °· You pass blood-tinged mucus.   °· You have vaginal bleeding.   °· You have continuous abdominal pain.   °· You have low back pain that you never had before.   °· You feel your baby's head pushing down and causing pelvic pressure.   °· Your baby is not moving as much as it used to.   °Document Released: 06/04/2005 Document Revised: 06/09/2013 Document Reviewed: 03/16/2013 °ExitCare® Patient Information ©2015 [employer]. This information is not intended to replace advice given to you by your health care   provider. Make sure you discuss any questions you have with your health care provider. Third Trimester of Pregnancy The third trimester is from week 29 through week 42, months 7 through 9. This trimester is when your unborn baby (fetus) is growing very fast. At the end of the ninth month, the unborn baby is about 20 inches in length. It weighs about 6-10 pounds.  HOME CARE   Avoid all smoking, herbs, and alcohol. Avoid drugs not approved by your  doctor.  Only take medicine as told by your doctor. Some medicines are safe and some are not during pregnancy.  Exercise only as told by your doctor. Stop exercising if you start having cramps.  Eat regular, healthy meals.  Wear a good support bra if your breasts are tender.  Do not use hot tubs, steam rooms, or saunas.  Wear your seat belt when driving.  Avoid raw meat, uncooked cheese, and liter boxes and soil used by cats.  Take your prenatal vitamins.  Try taking medicine that helps you poop (stool softener) as needed, and if your doctor approves. Eat more fiber by eating fresh fruit, vegetables, and whole grains. Drink enough fluids to keep your pee (urine) clear or pale yellow.  Take warm water baths (sitz baths) to soothe pain or discomfort caused by hemorrhoids. Use hemorrhoid cream if your doctor approves.  If you have puffy, bulging veins (varicose veins), wear support hose. Raise (elevate) your feet for 15 minutes, 3-4 times a day. Limit salt in your diet.  Avoid heavy lifting, wear low heels, and sit up straight.  Rest with your legs raised if you have leg cramps or low back pain.  Visit your dentist if you have not gone during your pregnancy. Use a soft toothbrush to brush your teeth. Be gentle when you floss.  You can have sex (intercourse) unless your doctor tells you not to.  Do not travel far distances unless you must. Only do so with your doctor's approval.  Take prenatal classes.  Practice driving to the hospital.  Pack your hospital bag.  Prepare the baby's room.  Go to your doctor visits. GET HELP IF:  You are not sure if you are in labor or if your water has broken.  You are dizzy.  You have mild cramps or pressure in your lower belly (abdominal).  You have a nagging pain in your belly area.  You continue to feel sick to your stomach (nauseous), throw up (vomit), or have watery poop (diarrhea).  You have bad smelling fluid coming from your  vagina.  You have pain with peeing (urination). GET HELP RIGHT AWAY IF:   You have a fever.  You are leaking fluid from your vagina.  You are spotting or bleeding from your vagina.  You have severe belly cramping or pain.  You lose or gain weight rapidly.  You have trouble catching your breath and have chest pain.  You notice sudden or extreme puffiness (swelling) of your face, hands, ankles, feet, or legs.  You have not felt the baby move in over an hour.  You have severe headaches that do not go away with medicine.  You have vision changes. Document Released: 08/29/2009 Document Revised: 09/29/2012 Document Reviewed: 08/05/2012 [facility] Patient Information 2015 [employer], [place]. This information is not intended to replace advice given to you by your health care provider. Make sure you discuss any questions you have with your health care provider. Fetal Movement Counts Patient Name: __________________________________________________ Patient Due Date: ____________________  Performing a fetal movement count is highly recommended in high-risk pregnancies, but it is good for every pregnant woman to do. Your health care provider may ask you to start counting fetal movements at 28 weeks of the pregnancy. Fetal movements often increase:  After eating a full meal.  After physical activity.  After eating or drinking something sweet or cold.  At rest. Pay attention to when you feel the baby is most active. This will help you notice a pattern of your baby's sleep and wake cycles and what factors contribute to an increase in fetal movement. It is important to perform a fetal movement count at the same time each day when your baby is normally most active.  HOW TO COUNT FETAL MOVEMENTS  Find a quiet and comfortable area to sit or lie down on your left side. Lying on your left side provides the best blood and oxygen circulation to your baby.  Write down the day and time on a sheet of paper  or in a journal.  Start counting kicks, flutters, swishes, rolls, or jabs in a 2-hour period. You should feel at least 10 movements within 2 hours.  If you do not feel 10 movements in 2 hours, wait 2-3 hours and count again. Look for a change in the pattern or not enough counts in 2 hours. SEEK MEDICAL CARE IF:  You feel less than 10 counts in 2 hours, tried twice.  There is no movement in over an hour.  The pattern is changing or taking longer each day to reach 10 counts in 2 hours.  You feel the baby is not moving as he or she usually does. Date: ____________ Movements: ____________ Start time: ____________ Doreatha MartinFinish time: ____________  Date: ____________ Movements: ____________ Start time: ____________ Doreatha MartinFinish time: ____________ Date: ____________ Movements: ____________ Start time: ____________ Doreatha MartinFinish time: ____________ Date: ____________ Movements: ____________ Start time: ____________ Doreatha MartinFinish time: ____________ Date: ____________ Movements: ____________ Start time: ____________ Doreatha MartinFinish time: ____________ Date: ____________ Movements: ____________ Start time: ____________ Doreatha MartinFinish time: ____________ Date: ____________ Movements: ____________ Start time: ____________ Doreatha MartinFinish time: ____________ Date: ____________ Movements: ____________ Start time: ____________ Doreatha MartinFinish time: ____________  Date: ____________ Movements: ____________ Start time: ____________ Doreatha MartinFinish time: ____________ Date: ____________ Movements: ____________ Start time: ____________ Doreatha MartinFinish time: ____________ Date: ____________ Movements: ____________ Start time: ____________ Doreatha MartinFinish time: ____________ Date: ____________ Movements: ____________ Start time: ____________ Doreatha MartinFinish time: ____________ Date: ____________ Movements: ____________ Start time: ____________ Doreatha MartinFinish time: ____________ Date: ____________ Movements: ____________ Start time: ____________ Doreatha MartinFinish time: ____________ Date: ____________ Movements: ____________ Start time:  ____________ Doreatha MartinFinish time: ____________  Date: ____________ Movements: ____________ Start time: ____________ Doreatha MartinFinish time: ____________ Date: ____________ Movements: ____________ Start time: ____________ Doreatha MartinFinish time: ____________ Date: ____________ Movements: ____________ Start time: ____________ Doreatha MartinFinish time: ____________ Date: ____________ Movements: ____________ Start time: ____________ Doreatha MartinFinish time: ____________ Date: ____________ Movements: ____________ Start time: ____________ Doreatha MartinFinish time: ____________ Date: ____________ Movements: ____________ Start time: ____________ Doreatha MartinFinish time: ____________ Date: ____________ Movements: ____________ Start time: ____________ Doreatha MartinFinish time: ____________  Date: ____________ Movements: ____________ Start time: ____________ Doreatha MartinFinish time: ____________ Date: ____________ Movements: ____________ Start time: ____________ Doreatha MartinFinish time: ____________ Date: ____________ Movements: ____________ Start time: ____________ Doreatha MartinFinish time: ____________ Date: ____________ Movements: ____________ Start time: ____________ Doreatha MartinFinish time: ____________ Date: ____________ Movements: ____________ Start time: ____________ Doreatha MartinFinish time: ____________ Date: ____________ Movements: ____________ Start time: ____________ Doreatha MartinFinish time: ____________ Date: ____________ Movements: ____________ Start time: ____________ Doreatha MartinFinish time: ____________  Date: ____________ Movements: ____________ Start time: ____________ Doreatha MartinFinish time: ____________ Date: ____________ Movements: ____________ Start time: ____________ Doreatha MartinFinish  time: ____________ Date: ____________ Movements: ____________ Start time: ____________ Doreatha MartinFinish time: ____________ Date: ____________ Movements: ____________ Start time: ____________ Doreatha MartinFinish time: ____________ Date: ____________ Movements: ____________ Start time: ____________ Doreatha MartinFinish time: ____________ Date: ____________ Movements: ____________ Start time: ____________ Doreatha MartinFinish time: ____________ Date:  ____________ Movements: ____________ Start time: ____________ Doreatha MartinFinish time: ____________  Date: ____________ Movements: ____________ Start time: ____________ Doreatha MartinFinish time: ____________ Date: ____________ Movements: ____________ Start time: ____________ Doreatha MartinFinish time: ____________ Date: ____________ Movements: ____________ Start time: ____________ Doreatha MartinFinish time: ____________ Date: ____________ Movements: ____________ Start time: ____________ Doreatha MartinFinish time: ____________ Date: ____________ Movements: ____________ Start time: ____________ Doreatha MartinFinish time: ____________ Date: ____________ Movements: ____________ Start time: ____________ Doreatha MartinFinish time: ____________ Date: ____________ Movements: ____________ Start time: ____________ Doreatha MartinFinish time: ____________  Date: ____________ Movements: ____________ Start time: ____________ Doreatha MartinFinish time: ____________ Date: ____________ Movements: ____________ Start time: ____________ Doreatha MartinFinish time: ____________ Date: ____________ Movements: ____________ Start time: ____________ Doreatha MartinFinish time: ____________ Date: ____________ Movements: ____________ Start time: ____________ Doreatha MartinFinish time: ____________ Date: ____________ Movements: ____________ Start time: ____________ Doreatha MartinFinish time: ____________ Date: ____________ Movements: ____________ Start time: ____________ Doreatha MartinFinish time: ____________ Date: ____________ Movements: ____________ Start time: ____________ Doreatha MartinFinish time: ____________  Date: ____________ Movements: ____________ Start time: ____________ Doreatha MartinFinish time: ____________ Date: ____________ Movements: ____________ Start time: ____________ Doreatha MartinFinish time: ____________ Date: ____________ Movements: ____________ Start time: ____________ Doreatha MartinFinish time: ____________ Date: ____________ Movements: ____________ Start time: ____________ Doreatha MartinFinish time: ____________ Date: ____________ Movements: ____________ Start time: ____________ Doreatha MartinFinish time: ____________ Date: ____________ Movements: ____________ Start  time: ____________ Doreatha MartinFinish time: ____________ Document Released: 07/04/2006 Document Revised: 10/19/2013 Document Reviewed: 03/31/2012 Exit[employer] Patient Information 2015 [place], [place]. This information is not intended to replace advice given to you by your health care provider. Make sure you discuss any questions you have with your health care provider.

## 2014-10-29 LAB — GC/CHLAMYDIA PROBE AMP
CT Probe RNA: NEGATIVE
GC Probe RNA: NEGATIVE

## 2014-10-30 LAB — CULTURE, BETA STREP (GROUP B ONLY)

## 2014-11-02 ENCOUNTER — Inpatient Hospital Stay (HOSPITAL_COMMUNITY)
Admission: AD | Admit: 2014-11-02 | Discharge: 2014-11-02 | Disposition: A | Discharge status: Home / Self Care | Payer: Medicaid Other | Source: Ambulatory Visit | Attending: Family Medicine | Admitting: Family Medicine

## 2014-11-02 ENCOUNTER — Encounter (HOSPITAL_COMMUNITY): Payer: Self-pay | Admitting: *Deleted

## 2014-11-02 DIAGNOSIS — Z3A39 39 weeks gestation of pregnancy: Secondary | ICD-10-CM | POA: Diagnosis not present

## 2014-11-02 LAB — POCT FERN TEST: POCT FERN TEST: NEGATIVE

## 2014-11-02 NOTE — MAU Note (Signed)
Pt states she has been ctx q 4 minutes for a few hours. Possible LOF. Denies vb. +FM. Pain10/10.

## 2014-11-04 ENCOUNTER — Ambulatory Visit (INDEPENDENT_AMBULATORY_CARE_PROVIDER_SITE_OTHER): Payer: Medicaid Other | Admitting: Obstetrics & Gynecology

## 2014-11-04 VITALS — BP 117/62 | HR 73 | Temp 98.4°F | Wt 124.3 lb

## 2014-11-04 DIAGNOSIS — O0993 Supervision of high risk pregnancy, unspecified, third trimester: Secondary | ICD-10-CM

## 2014-11-04 LAB — POCT URINALYSIS DIP (DEVICE)
BILIRUBIN URINE: NEGATIVE
GLUCOSE, UA: NEGATIVE mg/dL
Ketones, ur: NEGATIVE mg/dL
NITRITE: NEGATIVE
Protein, ur: 30 mg/dL — AB
Specific Gravity, Urine: 1.02 (ref 1.005–1.030)
Urobilinogen, UA: 1 mg/dL (ref 0.0–1.0)
pH: 7 (ref 5.0–8.0)

## 2014-11-04 NOTE — Progress Notes (Signed)
Irregular contractions for 2 days, no LOF or Bleeding, precautions given

## 2014-11-04 NOTE — Patient Instructions (Signed)

## 2014-11-04 NOTE — Progress Notes (Signed)
Patient reports pain/pressure in pelvis and back; also reports pain in kidney area; trace leuks & hgb on UA; reports contractions closer since Tuesday, about every 5 minutes currently

## 2014-11-05 ENCOUNTER — Inpatient Hospital Stay (HOSPITAL_COMMUNITY)
Admission: AD | Admit: 2014-11-05 | Discharge: 2014-11-07 | DRG: 775 | Disposition: A | Discharge status: Home / Self Care | Payer: Medicaid Other | Source: Ambulatory Visit | Attending: Family Medicine | Admitting: Family Medicine

## 2014-11-05 ENCOUNTER — Encounter (HOSPITAL_COMMUNITY): Payer: Self-pay | Admitting: *Deleted

## 2014-11-05 ENCOUNTER — Inpatient Hospital Stay (HOSPITAL_COMMUNITY): Payer: Medicaid Other | Admitting: Anesthesiology

## 2014-11-05 DIAGNOSIS — O4292 Full-term premature rupture of membranes, unspecified as to length of time between rupture and onset of labor: Secondary | ICD-10-CM | POA: Diagnosis present

## 2014-11-05 DIAGNOSIS — Z833 Family history of diabetes mellitus: Secondary | ICD-10-CM | POA: Diagnosis not present

## 2014-11-05 DIAGNOSIS — O9962 Diseases of the digestive system complicating childbirth: Secondary | ICD-10-CM | POA: Diagnosis present

## 2014-11-05 DIAGNOSIS — Z3A38 38 weeks gestation of pregnancy: Secondary | ICD-10-CM | POA: Diagnosis present

## 2014-11-05 DIAGNOSIS — Z8249 Family history of ischemic heart disease and other diseases of the circulatory system: Secondary | ICD-10-CM | POA: Diagnosis not present

## 2014-11-05 DIAGNOSIS — K219 Gastro-esophageal reflux disease without esophagitis: Secondary | ICD-10-CM | POA: Diagnosis present

## 2014-11-05 DIAGNOSIS — Z87891 Personal history of nicotine dependence: Secondary | ICD-10-CM | POA: Diagnosis not present

## 2014-11-05 LAB — CBC
HCT: 32 % — ABNORMAL LOW (ref 36.0–46.0)
Hemoglobin: 10.5 g/dL — ABNORMAL LOW (ref 12.0–15.0)
MCH: 28.6 pg (ref 26.0–34.0)
MCHC: 32.8 g/dL (ref 30.0–36.0)
MCV: 87.2 fL (ref 78.0–100.0)
PLATELETS: 262 10*3/uL (ref 150–400)
RBC: 3.67 MIL/uL — ABNORMAL LOW (ref 3.87–5.11)
RDW: 15.1 % (ref 11.5–15.5)
WBC: 7.1 10*3/uL (ref 4.0–10.5)

## 2014-11-05 LAB — TYPE AND SCREEN
ABO/RH(D): A POS
Antibody Screen: NEGATIVE

## 2014-11-05 LAB — POCT FERN TEST: POCT Fern Test: POSITIVE

## 2014-11-05 LAB — SYPHILIS: RPR W/REFLEX TO RPR TITER AND TREPONEMAL ANTIBODIES, TRADITIONAL SCREENING AND DIAGNOSIS ALGORITHM: RPR Ser Ql: NONREACTIVE

## 2014-11-05 MED ORDER — LACTATED RINGERS IV SOLN
INTRAVENOUS | Status: DC
  Administered 2014-11-05 (×3): via INTRAVENOUS

## 2014-11-05 MED ORDER — OXYTOCIN BOLUS FROM INFUSION: 500.0000 mL | INTRAVENOUS | Status: DC

## 2014-11-05 MED ORDER — OXYTOCIN 10 UNIT/ML IJ SOLN
INTRAMUSCULAR | Status: AC
  Administered 2014-11-05: 10 [IU]
  Filled 2014-11-05: qty 1

## 2014-11-05 MED ORDER — OXYCODONE-ACETAMINOPHEN 5-325 MG PO TABS: 2.0000 | ORAL_TABLET | ORAL | Status: DC | PRN

## 2014-11-05 MED ORDER — ACETAMINOPHEN 325 MG PO TABS: 650.0000 mg | ORAL_TABLET | ORAL | Status: DC | PRN

## 2014-11-05 MED ORDER — LIDOCAINE HCL (PF) 1 % IJ SOLN
30.0000 mL | INTRAMUSCULAR | Status: DC | PRN
  Filled 2014-11-05: qty 30

## 2014-11-05 MED ORDER — LACTATED RINGERS IV SOLN
500.0000 mL | INTRAVENOUS | Status: DC | PRN
  Administered 2014-11-05 (×2): 500 mL via INTRAVENOUS

## 2014-11-05 MED ORDER — FENTANYL CITRATE (PF) 100 MCG/2ML IJ SOLN
50.0000 ug | INTRAMUSCULAR | Status: DC | PRN
  Administered 2014-11-05: 100 ug via INTRAVENOUS
  Filled 2014-11-05: qty 2

## 2014-11-05 MED ORDER — FENTANYL CITRATE (PF) 100 MCG/2ML IJ SOLN
INTRAMUSCULAR | Status: AC
  Administered 2014-11-05: 100 ug via EPIDURAL
  Filled 2014-11-05: qty 2

## 2014-11-05 MED ORDER — OXYCODONE-ACETAMINOPHEN 5-325 MG PO TABS: 1.0000 | ORAL_TABLET | ORAL | Status: DC | PRN

## 2014-11-05 MED ORDER — LIDOCAINE-EPINEPHRINE (PF) 2 %-1:200000 IJ SOLN
INTRAMUSCULAR | Status: DC | PRN
  Administered 2014-11-05: 3 mL

## 2014-11-05 MED ORDER — BUPIVACAINE HCL (PF) 0.25 % IJ SOLN
INTRAMUSCULAR | Status: DC | PRN
  Administered 2014-11-05: 6 mL
  Administered 2014-11-05 (×2): 3 mL

## 2014-11-05 MED ORDER — FLEET ENEMA 7-19 GM/118ML RE ENEM: 1.0000 | ENEMA | RECTAL | Status: DC | PRN

## 2014-11-05 MED ORDER — SODIUM BICARBONATE 8.4 % IV SOLN
INTRAVENOUS | Status: DC | PRN
  Administered 2014-11-05: 4 mL via EPIDURAL

## 2014-11-05 MED ORDER — EPHEDRINE 5 MG/ML INJ
10.0000 mg | INTRAVENOUS | Status: DC | PRN
  Filled 2014-11-05: qty 2

## 2014-11-05 MED ORDER — MISOPROSTOL 25 MCG QUARTER TABLET
25.0000 ug | ORAL_TABLET | ORAL | Status: DC
  Administered 2014-11-05: 25 ug via VAGINAL
  Filled 2014-11-05 (×3): qty 1
  Filled 2014-11-05: qty 0.25

## 2014-11-05 MED ORDER — TERBUTALINE SULFATE 1 MG/ML IJ SOLN: 0.2500 mg | Freq: Once | INTRAMUSCULAR | Status: AC | PRN

## 2014-11-05 MED ORDER — OXYTOCIN 40 UNITS IN LACTATED RINGERS INFUSION - SIMPLE MED
1.0000 m[IU]/min | INTRAVENOUS | Status: DC
  Administered 2014-11-05: 2 m[IU]/min via INTRAVENOUS
  Administered 2014-11-05: 6 m[IU]/min via INTRAVENOUS
  Filled 2014-11-05: qty 1000

## 2014-11-05 MED ORDER — ONDANSETRON HCL 4 MG/2ML IJ SOLN
4.0000 mg | Freq: Four times a day (QID) | INTRAMUSCULAR | Status: DC | PRN
  Administered 2014-11-05: 4 mg via INTRAVENOUS
  Filled 2014-11-05: qty 2

## 2014-11-05 MED ORDER — FENTANYL 2.5 MCG/ML BUPIVACAINE 1/10 % EPIDURAL INFUSION (WH - ANES)
14.0000 mL/h | INTRAMUSCULAR | Status: DC | PRN
  Administered 2014-11-05 (×2): 14 mL/h via EPIDURAL
  Filled 2014-11-05 (×2): qty 125

## 2014-11-05 MED ORDER — PHENYLEPHRINE 40 MCG/ML (10ML) SYRINGE FOR IV PUSH (FOR BLOOD PRESSURE SUPPORT)
80.0000 ug | PREFILLED_SYRINGE | INTRAVENOUS | Status: DC | PRN
  Filled 2014-11-05: qty 20
  Filled 2014-11-05: qty 2

## 2014-11-05 MED ORDER — OXYTOCIN 40 UNITS IN LACTATED RINGERS INFUSION - SIMPLE MED: 62.5000 mL/h | INTRAVENOUS | Status: DC

## 2014-11-05 MED ORDER — DIPHENHYDRAMINE HCL 50 MG/ML IJ SOLN: 12.5000 mg | INTRAMUSCULAR | Status: DC | PRN

## 2014-11-05 MED ORDER — CITRIC ACID-SODIUM CITRATE 334-500 MG/5ML PO SOLN
30.0000 mL | ORAL | Status: DC | PRN
Start: 2014-11-05 — End: 2014-11-06

## 2014-11-05 NOTE — Progress Notes (Signed)
Patient ID: Jenny Myers, female   DOB: 04/21/1991, 24 y.o.   MRN: [id number] Jenny Myers is a 24 y.o. [id number] at 6044w3d.  Subjective: Comfortable w/ epidural.   Objective: BP 128/80 mmHg  Pulse 82  Temp(Src) 98.2 F (36.8 C) (Oral)  Resp 18  Ht 4\' 11"  (1.499 m)  SpO2 99%  LMP 04/06/2014   FHT:  FHR: 135 bpm, variability: mod,  accelerations:  15x15,  decelerations:  mild variables UC:   Q  3 minutes per pt. Irreg UC's traced. Dilation: 3 Effacement (%): 50 Cervical Position: Posterior Station: -2 Presentation: Vertex Exam by:: Dorathy KinsmanVirginia Patrese Neal CNM  Labs: Results for orders placed or performed during the hospital encounter of 11/05/14 (from the past 24 hour(s))  Fern Test     Status: None   Collection Time: 11/05/14  5:40 AM  Result Value Ref Range   POCT Fern Test Positive = ruptured amniotic membanes   CBC     Status: Abnormal   Collection Time: 11/05/14  5:45 AM  Result Value Ref Range   WBC 7.1 4.0 - 10.5 K/uL   RBC 3.67 (L) 3.87 - 5.11 MIL/uL   Hemoglobin 10.5 (L) 12.0 - 15.0 g/dL   HCT 30.832.0 (L) 65.736.0 - 84.646.0 %   MCV 87.2 78.0 - 100.0 fL   MCH 28.6 26.0 - 34.0 pg   MCHC 32.8 30.0 - 36.0 g/dL   RDW 96.215.1 95.211.5 - 84.115.5 %   Platelets 262 150 - 400 K/uL  RPR     Status: None   Collection Time: 11/05/14  5:45 AM  Result Value Ref Range   RPR Ser Ql Non Reactive Non Reactive  Type and screen     Status: None   Collection Time: 11/05/14 10:31 AM  Result Value Ref Range   ABO/RH(D) A POS    Antibody Screen NEG    Sample Expiration 11/08/2014     Assessment / Plan: 744w3d week IUP Labor: Early Fetal Wellbeing:  Category I-II Pain Control:  Epidural Anticipated MOD:  SVD Start pitocin.   LuverneVirginia Gelena Klosinski, [place] IslandCNM 11/05/2014 5:23 PM

## 2014-11-05 NOTE — Progress Notes (Signed)
Pt c/o nausea and Zofran IV given

## 2014-11-05 NOTE — Anesthesia Preprocedure Evaluation (Signed)
Anesthesia Evaluation  Patient identified by MRN, date of birth, ID band Patient awake    Reviewed: Allergy & Precautions, NPO status , Patient's Chart, lab work & pertinent test results  History of Anesthesia Complications Negative for: history of anesthetic complications  Airway Mallampati: II  TM Distance: >3 FB Neck ROM: Full    Dental  (+) Teeth Intact   Pulmonary neg shortness of breath, neg sleep apnea, neg COPDneg recent URI, former smoker,  breath sounds clear to auscultation        Cardiovascular negative cardio ROS  Rhythm:Regular     Neuro/Psych negative neurological ROS  negative psych ROS   GI/Hepatic Neg liver ROS, GERD-  Medicated and Controlled,  Endo/Other  negative endocrine ROS  Renal/GU negative Renal ROS     Musculoskeletal   Abdominal   Peds  Hematology  (+) anemia ,   Anesthesia Other Findings   Reproductive/Obstetrics (+) Pregnancy                             Anesthesia Physical Anesthesia Plan  ASA: II  Anesthesia Plan: Epidural   Post-op Pain Management:    Induction:   Airway Management Planned:   Additional Equipment:   Intra-op Plan:   Post-operative Plan:   Informed Consent: I have reviewed the patients History and Physical, chart, labs and discussed the procedure including the risks, benefits and alternatives for the proposed anesthesia with the patient or authorized representative who has indicated his/her understanding and acceptance.     Plan Discussed with: Anesthesiologist  Anesthesia Plan Comments:         Anesthesia Quick Evaluation

## 2014-11-05 NOTE — Progress Notes (Signed)
Labor Progress Note  Jenny HillockLatrice Myers is a 24 y.o. [id number] at 3843w3d  admitted for PROM  S: Pain is moderately controlled with epidural. Feels some pressure with contractions.   O:  BP 127/71 mmHg  Pulse 69  Temp(Src) 98.3 F (36.8 C) (Oral)  Resp 20  SpO2 99%  LMP 04/06/2014     FHT:  FHR: 125 bpm, variability: moderate,  accelerations:  Present,  decelerations:  Present Variables UC:   irregular, every 5-10 minutes SVE:  Dilation: 2.5 Effacement (%): 50 Cervical Position: Posterior Station: -2 Presentation: Vertex Exam by:: B Mosca  Labs: Lab Results  Component Value Date   WBC 7.1 11/05/2014   HGB 10.5* 11/05/2014   HCT 32.0* 11/05/2014   MCV 87.2 11/05/2014   PLT 262 11/05/2014    Assessment / Plan: 24 y.o. [id number] 2043w3d in early labor Induction of labor due to PROM, will give cytotec.  Labor: Slowly progessing without much change. Will give cytotec and re-evaluate in 4hr Fetal Wellbeing:  Category II Pain Control:  Epidural Anticipated MOD:  NSVD  Expectant management   Caryl AdaJazma Akima Slaugh, DO 11/05/2014, 11:55 AM PGY-1, [facility]

## 2014-11-05 NOTE — H&P (Signed)
LABOR ADMISSION HISTORY AND PHYSICAL  Jenny Myers is a 24 y.o. female [phone] with IUP at 74w3dby 10wk UKore[place]resenting for PROM. She reports +FM, + contractions, +LOF. Stated she woke up at 0400 today with gush of fluid came to MAU, found to have fern positive. No VB, no blurry vision, headaches or peripheral edema, and RUQ pain.  She plans on breast feeding. She request BTL for birth control.  Dating: By 10wk UKorea--->  Estimated Date of Delivery: 11/16/14  [place](h/o serial UKoreamonitoring for EFW) Last _0 , CWD, normal anatomy, cephalic presentation, 14801K 39% EFW  [facility]ren[facility] History/Complications: - [facility] due to h/o IUGR (41wk 5 lb delivery, and 36wk 3lb 2 oz)  (Of note - pt's father has history of breast cancer (BRCA testing positive) She wants BTL for contraception - however, considered salpingectomy / oophorectomy since she does not have BRCA testing at this point.  Past Medical History: Past Medical History  Diagnosis Date  . Ectopic pregnancy 11/2010    Right salpingectomy  . Umbilical hernia   . Infection     chlamydia    Past Surgical History: Past Surgical History  Procedure Laterality Date  . Salpingectomy  2012    left ovary, ruptured ectopic  . Wisdom tooth extraction    . Induced abortion      Obstetrical History: OB History    Gravida Para Term Preterm AB TAB SAB Ectopic Multiple Living   _1 0 _2 0 2      Social History: History   Social History  . Marital Status: Single    Spouse Name: N/A  . Number of Children: N/A  . Years of Education: N/A   Social History Main Topics  . Smoking status: Former SResearch scientist (life sciences) . Smokeless tobacco: Never Used     Comment: quit with preg  . Alcohol Use: No     Comment: not since pregnancy  . Drug Use: No  . Sexual Activity: Yes    Birth Control/ Protection: None   Other Topics Concern  . None   Social History Narrative    Family History: Family History  Problem Relation Age  of Onset  . Hypertension Mother   . Diabetes Father   . Hypertension Father   . Cancer Father     breast  . Vision loss Paternal Grandmother   . Other Neg Hx     Allergies: Allergies  Allergen Reactions  . Garlic Anaphylaxis  . Penicillins Anaphylaxis and Nausea And Vomiting  . Carrot Flavor Hives    Prescriptions prior to admission  Medication Sig Dispense Refill Last Dose  . acetaminophen (TYLENOL) 325 MG tablet Take 650 mg by mouth every 6 (six) hours as needed for mild pain.    Past Month at Unknown time  . Prenatal Vit-Fe Fumarate-FA (PRENATAL MULTIVITAMIN) TABS tablet Take 1 tablet by mouth daily at 12 noon.   11/04/2014 at Unknown time  . promethazine (PHENERGAN) 25 MG tablet Take 1 tablet (25 mg total) by mouth every 6 (six) hours as needed for nausea or vomiting. 20 tablet 0 11/04/2014 at Unknown time  . ranitidine (ZANTAC) 150 MG tablet Take 1 tablet (150 mg total) by mouth 2 (two) times daily. 60 tablet 1 11/04/2014 at Unknown time     Review of Systems   All systems reviewed and negative except as stated in HPI  BP 120/82 mmHg  Pulse 90  Temp(Src) 98.2 F (  36.8 C) (Oral)  Resp 18  LMP 04/06/2014 General appearance: alert and cooperative, comfortable and cooperative, mild discomfort with infrequent ctx Lungs: clear to auscultation bilaterally Heart: regular rate and rhythm, no murmurs Abdomen: soft, non-tender; bowel sounds normal, appropriately gravid for GA Pelvic: did not re-examine s/p PROM Extremities: Homans sign is negative, no sign of DVT, edema DTR's +2 Presentation: cephalic Fetal monitoringBaseline: 125 bpm, Variability: Good {> 6 bpm), Accelerations: Reactive (10x10 and 15x15), and Decelerations: variable x 1 some tracing difficulties Uterine activityFrequency: Every 5-10 minutes some irregular, minimally palpable on exam Dilation: 2.5 Effacement (%): 50 Station: -2 Exam by:: B Mosca  (unchanged since last check MAU 5/17)   Prenatal  labs: ABO, Rh: A/POS/-- (01/28 1519) Antibody: NEG (01/28 1519) Rubella:   immune (07/15/14) RPR: NON REAC (04/28 1414)  HBsAg: NEGATIVE (01/28 1519)  HIV: NONREACTIVE (04/28 1414)  GBS: Negative (05/12 0000)  1 hr Glucola 128 early / 3rd trimester 130 Genetic screening  Not done Anatomy US normal (limited view of heart), growth @ 32wk nml  Prenatal Transfer Tool  Maternal Diabetes: No Genetic Screening: Declined Maternal Ultrasounds/Referrals: Normal Fetal Ultrasounds or other Referrals:  None Maternal Substance Abuse:  No Significant Maternal Medications:  None Significant Maternal Lab Results: Lab values include: Group B Strep negative  Results for orders placed or performed during the hospital encounter of 11/05/14 (from the past 24 hour(s))  Fern Test   Collection Time: 11/05/14  5:40 AM  Result Value Ref Range   POCT Fern Test Positive = ruptured amniotic membanes   CBC   Collection Time: 11/05/14  5:45 AM  Result Value Ref Range   WBC 7.1 4.0 - 10.5 K/uL   RBC 3.67 (L) 3.87 - 5.11 MIL/uL   Hemoglobin 10.5 (L) 12.0 - 15.0 g/dL   HCT 32.0 (L) 36.0 - 46.0 %   MCV 87.2 78.0 - 100.0 fL   MCH 28.6 26.0 - 34.0 pg   MCHC 32.8 30.0 - 36.0 g/dL   RDW 15.1 11.5 - 15.5 %   Platelets 262 150 - 400 K/uL  Results for orders placed or performed in visit on 11/04/14 (from the past 24 hour(s))  POCT urinalysis dip (device)   Collection Time: 11/04/14  9:06 AM  Result Value Ref Range   Glucose, UA NEGATIVE NEGATIVE mg/dL   Bilirubin Urine NEGATIVE NEGATIVE   Ketones, ur NEGATIVE NEGATIVE mg/dL   Specific Gravity, Urine 1.020 1.005 - 1.030   Hgb urine dipstick TRACE (A) NEGATIVE   pH 7.0 5.0 - 8.0   Protein, ur 30 (A) NEGATIVE mg/dL   Urobilinogen, UA 1.0 0.0 - 1.0 mg/dL   Nitrite NEGATIVE NEGATIVE   Leukocytes, UA TRACE (A) NEGATIVE    Patient Active Problem List   Diagnosis Date Noted  . Marijuana abuse 07/21/2014  . Chlamydia infection affecting pregnancy in second  trimester 07/20/2014  . Supervision of high risk pregnancy in third trimester 07/15/2014  . Prior poor obstetrical history in second trimester, antepartum 07/15/2014  . Family history of breast cancer in female 07/15/2014  . Umbilical hernia 84/16/6063  . IUGR (intrauterine growth restriction) in prior pregnancy, pregnant 07/16/2011    Assessment: Semone Orlov is a 24 y.o. [id number] at 98w3dadmitted for PROM (confirmed fern positive in MAU), no active labor, irregular uncomfortable contractions up to 10 min, no cervical change since 5/17. - HRC pregnancy with prior IUGR, however current pregnancy normal EFW and growth  #Labor: PROM. Expectant management to see if progression. Consider cytotec  if no cervical change. #Pain: Fentanyl IV, may have epidural #FWB:  Cat 1, variable x 1 with difficulty tracing #ID:  GBS negative #MOF: Breast #MOC:BTL vs interval salpingo/oophrectomy (d/t Father BRCA positive, pt untested) #Circ:  N/a #Peds: [place], [place], PGY-2 11/05/14 at [id number]  I have seen and examined this patient and agree the above assessment. CRESENZO-DISHMAN,Zell Hylton 11/06/2014 11:08 AM

## 2014-11-05 NOTE — Anesthesia Procedure Notes (Signed)
Epidural Patient location during procedure: OB  Staffing Anesthesiologist: Shena Vinluan, CHRIS Performed by: anesthesiologist   Preanesthetic Checklist Completed: patient identified, surgical consent, pre-op evaluation, timeout performed, IV checked, risks and benefits discussed and monitors and equipment checked  Epidural Patient position: sitting Prep: site prepped and draped and DuraPrep Patient monitoring: heart rate, cardiac monitor, continuous pulse ox and blood pressure Approach: midline Location: L3-L4 Injection technique: LOR saline  Needle:  Needle type: Tuohy  Needle gauge: 17 G Needle length: 9 cm Needle insertion depth: 6 cm Catheter type: closed end flexible Catheter size: 19 Gauge Catheter at skin depth: 13 cm Test dose: negative and 2% lidocaine with Epi 1:200 K  Assessment Events: blood not aspirated, injection not painful, no injection resistance, negative IV test and no paresthesia  Additional Notes H+P and labs checked, risks and benefits discussed with the patient, consent obtained, procedure tolerated well and without complications.  Reason for block:procedure for pain   

## 2014-11-05 NOTE — MAU Note (Signed)
Pt reports LOF @ 0400. Contractions started after Leaking

## 2014-11-06 LAB — CBC
HCT: 32.9 % — ABNORMAL LOW (ref 36.0–46.0)
HEMOGLOBIN: 10.8 g/dL — AB (ref 12.0–15.0)
MCH: 28.3 pg (ref 26.0–34.0)
MCHC: 32.8 g/dL (ref 30.0–36.0)
MCV: 86.4 fL (ref 78.0–100.0)
Platelets: 245 10*3/uL (ref 150–400)
RBC: 3.81 MIL/uL — ABNORMAL LOW (ref 3.87–5.11)
RDW: 15.3 % (ref 11.5–15.5)
WBC: 12.1 10*3/uL — ABNORMAL HIGH (ref 4.0–10.5)

## 2014-11-06 LAB — MRSA PCR SCREENING: MRSA BY PCR: NEGATIVE

## 2014-11-06 MED ORDER — SENNOSIDES-DOCUSATE SODIUM 8.6-50 MG PO TABS
2.0000 | ORAL_TABLET | ORAL | Status: DC
  Administered 2014-11-06 (×2): 2 via ORAL
  Filled 2014-11-06: qty 2

## 2014-11-06 MED ORDER — DIBUCAINE 1 % RE OINT: 1.0000 "application " | TOPICAL_OINTMENT | RECTAL | Status: DC | PRN

## 2014-11-06 MED ORDER — OXYCODONE-ACETAMINOPHEN 5-325 MG PO TABS
2.0000 | ORAL_TABLET | ORAL | Status: DC | PRN
  Administered 2014-11-06 – 2014-11-07 (×2): 2 via ORAL
  Filled 2014-11-06 (×4): qty 2

## 2014-11-06 MED ORDER — DIPHENHYDRAMINE HCL 25 MG PO CAPS: 25.0000 mg | ORAL_CAPSULE | Freq: Four times a day (QID) | ORAL | Status: DC | PRN

## 2014-11-06 MED ORDER — HEPARIN SODIUM (PORCINE) 5000 UNIT/ML IJ SOLN
5000.0000 [IU] | Freq: Three times a day (TID) | INTRAMUSCULAR | Status: DC
  Filled 2014-11-06 (×2): qty 1

## 2014-11-06 MED ORDER — PRENATAL MULTIVITAMIN CH
1.0000 | ORAL_TABLET | Freq: Every day | ORAL | Status: DC
  Administered 2014-11-06 – 2014-11-07 (×2): 1 via ORAL
  Filled 2014-11-06 (×2): qty 1

## 2014-11-06 MED ORDER — TETANUS-DIPHTH-ACELL PERTUSSIS 5-2.5-18.5 LF-MCG/0.5 IM SUSP: 0.5000 mL | Freq: Once | INTRAMUSCULAR | Status: DC

## 2014-11-06 MED ORDER — ACETAMINOPHEN 325 MG PO TABS: 650.0000 mg | ORAL_TABLET | ORAL | Status: DC | PRN

## 2014-11-06 MED ORDER — LANOLIN HYDROUS EX OINT: TOPICAL_OINTMENT | CUTANEOUS | Status: DC | PRN

## 2014-11-06 MED ORDER — ONDANSETRON HCL 4 MG PO TABS: 4.0000 mg | ORAL_TABLET | ORAL | Status: DC | PRN

## 2014-11-06 MED ORDER — WITCH HAZEL-GLYCERIN EX PADS: 1.0000 "application " | MEDICATED_PAD | CUTANEOUS | Status: DC | PRN

## 2014-11-06 MED ORDER — ONDANSETRON HCL 4 MG/2ML IJ SOLN: 4.0000 mg | INTRAMUSCULAR | Status: DC | PRN

## 2014-11-06 MED ORDER — ZOLPIDEM TARTRATE 5 MG PO TABS: 5.0000 mg | ORAL_TABLET | Freq: Every evening | ORAL | Status: DC | PRN

## 2014-11-06 MED ORDER — IBUPROFEN 600 MG PO TABS
600.0000 mg | ORAL_TABLET | Freq: Four times a day (QID) | ORAL | Status: DC
  Administered 2014-11-06 – 2014-11-07 (×6): 600 mg via ORAL
  Filled 2014-11-06 (×5): qty 1

## 2014-11-06 MED ORDER — BENZOCAINE-MENTHOL 20-0.5 % EX AERO: 1.0000 "application " | INHALATION_SPRAY | CUTANEOUS | Status: DC | PRN

## 2014-11-06 MED ORDER — OXYCODONE-ACETAMINOPHEN 5-325 MG PO TABS
1.0000 | ORAL_TABLET | ORAL | Status: DC | PRN
  Administered 2014-11-06 – 2014-11-07 (×6): 1 via ORAL
  Filled 2014-11-06 (×2): qty 1

## 2014-11-06 MED ORDER — SIMETHICONE 80 MG PO CHEW: 80.0000 mg | CHEWABLE_TABLET | ORAL | Status: DC | PRN

## 2014-11-06 NOTE — Progress Notes (Signed)
  CLINICAL SOCIAL WORK MATERNAL/CHILD NOTE  Patient Details  Name: Jenny Myers MRN: [id number] Date of Birth: 11/05/2016  Date: 11/06/2014  Clinical Social Worker Initiating Note: Norlene Duel, LCSWDate/ Time Initiated: 11/06/14/1330   Child's Name: Jenny Myers   Legal Guardian:  (parents Lenor Coffin and Bonnielee Haff)   Need for Interpreter: None   Date of Referral: 11/05/14   Reason for Referral: Other (Comment)   Referral Source: [employer]   Address: [address] [place], [place] [address]  Phone number:  [phone])   Household Members: Significant Other, Minor Children   Natural Supports (not living in the home): Extended Family, Immediate Family, Spouse/significant other   Professional Supports:None   Employment: (FO[employer] is employed)   Type of Work:     Education:     Museum/gallery curator Resources:Medicaid   Other Resources: [employer] , [employer], Physicist, medical    Cultural/Religious Considerations Which May Impact Care: none noted  Strengths: Ability to meet basic needs , Home prepared for child    Risk Factors/Current Problems:  (Hx of marijuana use)   Cognitive State: Alert , Able to Concentrate    Mood/Affect: Happy , Calm    CSW Assessment: Acknowledged order for social work consult to address concerns regarding mother's hx of substance abuse. Met with mother who was pleasant and receptive to CSW intervention. She is a single parent with two other dependents ages 23 and 50. Mother admits to occasional use of marijuana prior to finding out about the pregnancy. She denies any other illicit drug use. She denies any need for treatment. UDS on newborn is pending. She denies any hx of mental illness. No acute social concerns related or noted at this time.   CSW Plan/Description: No Further Intervention Required/No Barriers to Discharge; will continue to monitor drug  screen   Sulo Janczak J, LCSW 11/06/2014, 3:58 PM

## 2014-11-06 NOTE — Lactation Note (Addendum)
This note was copied from the chart of Jenny Darrelyn HillockLatrice Peugh. Lactation Consultation Note  P3, BF other 2 children for 6 months. Baby sleeping getting ready to have hearing screen. Understands how to hand express. Baby recently bf for 30 min. Encouraged STS, wearing hat, feeding on demand and feeding at least every 3 hours. Mother denies questions.  Mother states her nipples are starting to get sore and has been applying ebm. Discussed achieving a deep latch and gave her comfort gels. Mom encouraged to feed baby 8-12 times/24 hours and with feeding cues.  Mom made aware of O/P services, breastfeeding support groups, community resources, and our phone # for post-discharge questions.    Patient Name: Jenny Darrelyn HillockLatrice Suazo UVOZD'GToday's Date: 11/06/2014 Reason for consult: Initial assessment   Maternal Data    Feeding Feeding Type: Breast Fed Length of feed: 30 min  LATCH Score/Interventions                      Lactation Tools Discussed/Used     Consult Status Consult Status: Follow-up Date: 11/07/14 Follow-up type: In-patient    Dahlia ByesBerkelhammer, [facility] 11/06/2014, 2:20 PM

## 2014-11-06 NOTE — Progress Notes (Signed)
Post Partum Day #1 Subjective: no complaints, up ad lib, voiding, tolerating PO and + flatus, no BM  Objective: Blood pressure 115/49, pulse 55, temperature 98.7 F (37.1 C), temperature source Oral, resp. rate 18, height $RemoveBe'4\' 11"'bWKczDqFQ$  (1.499 m), last menstrual period 04/06/2014, SpO2 99 %, unknown if currently breastfeeding.  Physical Exam:  General: alert and cooperative, well-appearing, sitting up in bed, NAD Lochia: appropriate Uterine Fundus: firm, at U DVT Evaluation: No evidence of DVT seen on physical exam. Negative Homan's sign. No cords or calf tenderness. No significant calf/ankle edema.   Recent Labs  11/05/14 0545 11/06/14 0558  HGB 10.5* 10.8*  HCT 32.0* 32.9*    Assessment/Plan: Jenny Myers is a 24 y.o. [id number] at [redacted]w[redacted]d s/p NSVD (VBAC) healthy baby girl on 5/20 at 2117 - Postpartum course uncomplicated. Maternal GBS negative - Pain controlled on ibuprofen, bowel regimen PRN - Continue breast / bottle feeding, lactation consult as needed - Contraception: decision for future interval salpingo/oophrectomy - (previously BTL, but h/o father BRCA positive) - plan to follow-up outpatient [facility] to discuss surgery, consider BRCA testing, and anticipate within 6 week period for ins coverage - No BTL today. Resume regular diet - Anticipate discharge tomorrow 5/22    LOS: 1 day   [name], [employer], PGY-2  11/06/2014, 8:57 AM

## 2014-11-06 NOTE — Anesthesia Postprocedure Evaluation (Signed)
  Anesthesia Post-op Note  Patient: Jenny Myers  Procedure(s) Performed: * No procedures listed *  Patient Location: Mother/Baby  Anesthesia Type:Epidural  Level of Consciousness: awake and alert   Airway and Oxygen Therapy: Patient Spontanous Breathing  Post-op Pain: mild  Post-op Assessment: Post-op Vital signs reviewed, Patient's Cardiovascular Status Stable, Respiratory Function Stable, No signs of Nausea or vomiting, Pain level controlled, No headache, No residual numbness and No residual motor weakness  Post-op Vital Signs: Reviewed  Last Vitals:  Filed Vitals:   11/06/14 0530  BP: 115/49  Pulse: 55  Temp: 37.1 C  Resp: 18    Complications: No apparent anesthesia complications

## 2014-11-07 MED ORDER — OXYCODONE-ACETAMINOPHEN 5-325 MG PO TABS: 1.0000 | ORAL_TABLET | ORAL | Status: DC | PRN

## 2014-11-07 MED ORDER — IBUPROFEN 600 MG PO TABS: 600.0000 mg | ORAL_TABLET | Freq: Four times a day (QID) | ORAL | Status: DC

## 2014-11-07 MED ORDER — DOCUSATE SODIUM 100 MG PO CAPS: 100.0000 mg | ORAL_CAPSULE | Freq: Two times a day (BID) | ORAL | Status: DC

## 2014-11-07 NOTE — Discharge Summary (Signed)
Obstetric Discharge Summary Reason for Admission: premature rupture of membranes (PROM) Prenatal Procedures: NST and ultrasound. Considered High Risk (h/o prior IUGR), current preg nml EFW Intrapartum Procedures: spontaneous vaginal delivery Postpartum Procedures: none Complications-Operative and Postpartum: superficial peri-clitoral, no repair required HEMOGLOBIN  Date Value Ref Range Status  11/06/2014 10.8* 12.0 - 15.0 g/dL Final   HCT  Date Value Ref Range Status  11/06/2014 32.9* 36.0 - 46.0 % Final    Discharge Diagnoses: Term Pregnancy-delivered  Hospital Course:  Jenny Myers is a 25 y.o. [id number] at [redacted]w[redacted]d who was admitted on 11/05/14 for PROM. Pregnancy was uncomplicated, High Risk Clinic pt with h/o prior IUGR but current pregnancy with nml EFW and growth. Maternal GBS negative. Progressed to vaginal delivery 14 hours (see copied delivery note below). Postpartum course was uncomplicated, tolerating PO and ambulation, pain controlled on ibuprofen and percocet (will go home with short course), bleeding improved, +flatus/BM, and no barriers to discharge. Plan for continue breast / bottle feeding, contraception with planned future interval b/l salpingectomy (h/o father BRCA+, pt untested, previously desired BTL), follow-up in 4-6 weeks for postpartum visit (& arrange surgery).  Of note, baby is SGA <10%. Followed closely by Pediatricians.  Delivery Note At 9:17 PM a viable female was delivered via (Presentation: OA). APGAR: 9, 9; weight 5 lb 8.7oz.  Placenta status: intact, Cord: 3v with the following complications: none  Anesthesia: epidural Episiotomy: none Lacerations: Superficial peri-clitoral Suture Repair: none Est. Blood Loss (mL): 200cc   Mom to postpartum. Baby to Couplet care / Skin to Skin.  Upon arrival patient was complete and pushing. She pushed with good maternal effort to deliver a healthy girl. Baby delivered without difficulty, was noted to have  good tone and place on maternal abdomen for oral suctioning, drying and stimulation. Delayed cord clamping performed and cut by FOB. Placenta delivered intact with 3V cord. Vaginal canal with small superficial periclitoral lac (already hemostatic and good approximation) and perineum was inspected and intact. No suture repair needed. Pitocin was started and uterus massaged until bleeding slowed. Counts of sharps, instruments, and lap pads were all correct.   Jenny Myers, [place], PGY-2 11/05/2014, 9:37 PM  I was present and gloved for entire delivery No difficulty with shoulders Agree with above note Jenny Myers, CNM  Attestation of Attending Supervision of Advanced Practitioner (PA/CNM/NP): Evaluation and management procedures were performed by the Advanced Practitioner under my supervision and collaboration. I have reviewed the Advanced Practitioner's note and chart, and I agree with the management and plan.  Jenny Boston, DO Attending Physician Faculty [facility], [place]  --------------------------------  Physical Exam:  General: alert and cooperative, well-appearing, NAD Lochia: appropriate Uterine Fundus: firm DVT Evaluation: No evidence of DVT seen on physical exam. Negative Homan's sign. No cords or calf tenderness. No significant calf/ankle edema.  Discharge Information: Date: 11/07/2014 Activity: pelvic rest Diet: routine Medications: PNV, Ibuprofen and Colace Baby feeding: plans to breastfeed, bottle supplement Contraception: planned future interval b/l salpingectomy (Father BRCA+, pt untested) Condition: stable Instructions: refer to practice specific booklet Discharge to: home   Newborn Data: Live born female  Birth Weight: 5 lb 8.7 oz (2515 g) APGAR: 9, 9  Anticipated discharge home with mother.  Jenny Myers, Jenny Myers, PGY-2 11/07/2014, 7:02 AM   I spoke with  and examined patient and agree with resident/PA/SNM's note and plan of care.  Jenny Myers, CNM, [facility] 11/07/2014 7:16 AM

## 2014-11-07 NOTE — Discharge Instructions (Signed)
[facility] next week to schedule a 2 week appointment (Pre-Operative) to discuss potential surgery (bilateral salpinectomy) to remove both fallopian tubes for contraception and reduce cancer risk.  Also, you can schedule a 4 to 6 week post-partum follow-up at this time.   Postpartum Care After Vaginal Delivery After you deliver your newborn (postpartum period), the usual stay in the hospital is 24-72 hours. If there were problems with your labor or delivery, or if you have other medical problems, you might be in the hospital longer.  While you are in the hospital, you will receive help and instructions on how to care for yourself and your newborn during the postpartum period.  While you are in the hospital:  Be sure to tell your nurses if you have pain or discomfort, as well as where you feel the pain and what makes the pain worse.  If you had an incision made near your vagina (episiotomy) or if you had some tearing during delivery, the nurses may put ice packs on your episiotomy or tear. The ice packs may help to reduce the pain and swelling.  If you are breastfeeding, you may feel uncomfortable contractions of your uterus for a couple of weeks. This is normal. The contractions help your uterus get back to normal size.  It is normal to have some bleeding after delivery.  For the first 1-3 days after delivery, the flow is red and the amount may be similar to a period.  It is common for the flow to start and stop.  In the first few days, you may pass some small clots. Let your nurses know if you begin to pass large clots or your flow increases.  Do not  flush blood clots down the toilet before having the nurse look at them.  During the next 3-10 days after delivery, your flow should become more watery and pink or brown-tinged in color.  Ten to fourteen days after delivery, your flow should be a small amount of yellowish-white discharge.  The amount of your flow  will decrease over the first few weeks after delivery. Your flow may stop in 6-8 weeks. Most women have had their flow stop by 12 weeks after delivery.  You should change your sanitary pads frequently.  Wash your hands thoroughly with soap and water for at least 20 seconds after changing pads, using the toilet, or before holding or feeding your newborn.  You should feel like you need to empty your bladder within the first 6-8 hours after delivery.  In case you become weak, lightheaded, or faint, call your nurse before you get out of bed for the first time and before you take a shower for the first time.  Within the first few days after delivery, your breasts may begin to feel tender and full. This is called engorgement. Breast tenderness usually goes away within 48-72 hours after engorgement occurs. You may also notice milk leaking from your breasts. If you are not breastfeeding, do not stimulate your breasts. Breast stimulation can make your breasts produce more milk.  Spending as much time as possible with your newborn is very important. During this time, you and your newborn can feel close and get to know each other. Having your newborn stay in your room (rooming in) will help to strengthen the bond with your newborn. It will give you time to get to know your newborn and become comfortable caring for your newborn.  Your hormones change after delivery. Sometimes  the hormone changes can temporarily cause you to feel sad or tearful. These feelings should not last more than a few days. If these feelings last longer than that, you should talk to your caregiver.  If desired, talk to your caregiver about methods of family planning or contraception.  Talk to your caregiver about immunizations. Your caregiver may want you to have the following immunizations before leaving the hospital:  Tetanus, diphtheria, and pertussis (Tdap) or tetanus and diphtheria (Td) immunization. It is very important that you  and your family (including grandparents) or others caring for your newborn are up-to-date with the Tdap or Td immunizations. The Tdap or Td immunization can help protect your newborn from getting ill.  Rubella immunization.  Varicella (chickenpox) immunization.  Influenza immunization. You should receive this annual immunization if you did not receive the immunization during your pregnancy. Document Released: 04/01/2007 Document Revised: 02/27/2012 Document Reviewed: 01/30/2012 [facility] Patient Information 2015 [place], [place]. This information is not intended to replace advice given to you by your health care provider. Make sure you discuss any questions you have with your health care provider.

## 2014-11-07 NOTE — Lactation Note (Addendum)
This note was copied from the chart of Girl Darrelyn HillockLatrice Brandon. Lactation Consultation Note  Baby latched in cross cradle position.  Baby's lips flanged on breast. Sucks and swallows observed, some with stimulation.  LS9. Suggested mother massage breast to keep baby active. LC viewed feeding for 12 min.  Discussed when baby's sucking slows to burp and breastfeed on other breast. Reviewed waking techniques, cluster feeding, engorgement care and monitoring voids/stools. Mother recently pumped approx 3 ml of colostrum.  Asked questions about when her milk supply with transition. Mom encouraged to feed baby 8-12 times/24 hours and with feeding cues. Reminded parents to wake baby is she does wake after 3.5 hours for feeding.    Patient Name: Girl Darrelyn HillockLatrice Burkhead ZOXWR'UToday's Date: 11/07/2014 Reason for consult: Follow-up assessment   Maternal Data    Feeding Feeding Type: Breast Fed Length of feed: 15 min  LATCH Score/Interventions Latch: Grasps breast easily, tongue down, lips flanged, rhythmical sucking.  Audible Swallowing: A few with stimulation  Type of Nipple: Everted at rest and after stimulation  Comfort (Breast/Nipple): Soft / non-tender     Hold (Positioning): No assistance needed to correctly position infant at breast.  LATCH Score: 9  Lactation Tools Discussed/Used     Consult Status Consult Status: Complete    Hardie PulleyBerkelhammer, [place] Boschen 11/07/2014, 9:54 AM

## 2014-11-07 NOTE — Progress Notes (Signed)
UDS on newborn was positive for marijuana.  Report was made to DSS.  [facility], an informed that an investigator will follow up with the family at home.  Plan is for newborn to return home with mother and DSS will follow up in the community.

## 2014-11-11 ENCOUNTER — Encounter: Payer: Medicaid Other | Admitting: Family Medicine

## 2014-11-16 ENCOUNTER — Inpatient Hospital Stay (HOSPITAL_COMMUNITY)
Admission: AD | Admit: 2014-11-16 | Discharge: 2014-11-22 | DRG: 776 | Disposition: A | Discharge status: Home / Self Care | Payer: Medicaid Other | Source: Ambulatory Visit | Attending: Cardiovascular Disease | Admitting: Cardiovascular Disease

## 2014-11-16 ENCOUNTER — Inpatient Hospital Stay (HOSPITAL_COMMUNITY): Payer: Medicaid Other

## 2014-11-16 ENCOUNTER — Encounter (HOSPITAL_COMMUNITY): Payer: Self-pay | Admitting: *Deleted

## 2014-11-16 ENCOUNTER — Emergency Department (HOSPITAL_COMMUNITY)
Admission: EM | Admit: 2014-11-16 | Discharge: 2014-11-16 | Disposition: A | Discharge status: Home / Self Care | Payer: Medicaid Other

## 2014-11-16 DIAGNOSIS — R079 Chest pain, unspecified: Secondary | ICD-10-CM | POA: Insufficient documentation

## 2014-11-16 DIAGNOSIS — O9089 Other complications of the puerperium, not elsewhere classified: Principal | ICD-10-CM | POA: Diagnosis present

## 2014-11-16 DIAGNOSIS — I2102 ST elevation (STEMI) myocardial infarction involving left anterior descending coronary artery: Secondary | ICD-10-CM | POA: Insufficient documentation

## 2014-11-16 DIAGNOSIS — I2542 Coronary artery dissection: Secondary | ICD-10-CM | POA: Diagnosis present

## 2014-11-16 DIAGNOSIS — I249 Acute ischemic heart disease, unspecified: Secondary | ICD-10-CM | POA: Diagnosis present

## 2014-11-16 DIAGNOSIS — K5900 Constipation, unspecified: Secondary | ICD-10-CM | POA: Diagnosis not present

## 2014-11-16 DIAGNOSIS — I213 ST elevation (STEMI) myocardial infarction of unspecified site: Secondary | ICD-10-CM | POA: Diagnosis present

## 2014-11-16 DIAGNOSIS — Z87891 Personal history of nicotine dependence: Secondary | ICD-10-CM

## 2014-11-16 HISTORY — DX: Coronary artery dissection: I25.42

## 2014-11-16 MED ORDER — HYDROMORPHONE HCL 1 MG/ML IJ SOLN
1.0000 mg | Freq: Once | INTRAMUSCULAR | Status: AC
  Administered 2014-11-17: 1 mg via INTRAMUSCULAR
  Filled 2014-11-16: qty 1

## 2014-11-16 MED ORDER — GI COCKTAIL ~~LOC~~
30.0000 mL | Freq: Once | ORAL | Status: AC
  Administered 2014-11-16: 30 mL via ORAL
  Filled 2014-11-16: qty 30

## 2014-11-16 NOTE — ED Notes (Signed)
PT HAS DECIDED TO LEAVE AMA.  ADVISED PT SHE SHOULD NOT LEAVE AND SHE SAID SHE WAS LEAVING ANYWAYS.

## 2014-11-16 NOTE — MAU Note (Signed)
Pt very uncomfortable and crying clutching her left arm saying that it is so painful. Patient states that there are shooting pains constantly in her arm. Harlon FlorJ Wenzel PA aware and talking with Dr. Adrian BlackwaterStinson. Pain is rated as the worse.

## 2014-11-16 NOTE — MAU Provider Note (Signed)
History     CSN: [id number]  Arrival date and time: 11/16/14 2136   First Provider Initiated Contact with Patient 11/16/14 2207      No chief complaint on file.  HPI  [name] is a 24 y.o. [id number] s/p NSVD on 11/05/14 who presents to MAU today with complaint of sudden onset substernal chest pain around 1930 today. She states that it also hurts in her throat. She denies burning or abdominal pain or fever. She states an associated tingling in her left arm. The patient is breastfeeding without complications. She denies redness, swelling or pain in her breasts. She also endorses nausea today. Patient has continued to have   OB History    Gravida Para Term Preterm AB TAB SAB Ectopic Multiple Living   6 3 3  0 3 1 1 1  0 3      Past Medical History  Diagnosis Date  . Ectopic pregnancy 11/2010    Right salpingectomy  . Umbilical hernia   . Infection     chlamydia    Past Surgical History  Procedure Laterality Date  . Salpingectomy  2012    left ovary, ruptured ectopic  . Wisdom tooth extraction    . Induced abortion      Family History  Problem Relation Age of Onset  . Hypertension Mother   . Diabetes Father   . Hypertension Father   . Cancer Father     breast  . Vision loss Paternal Grandmother   . Other Neg Hx     History  Substance Use Topics  . Smoking status: Former Games developermoker  . Smokeless tobacco: Never Used     Comment: quit with preg  . Alcohol Use: No     Comment: not since pregnancy    Allergies:  Allergies  Allergen Reactions  . Garlic Anaphylaxis  . Penicillins Anaphylaxis and Nausea And Vomiting  . Carrot Flavor Hives    Prescriptions prior to admission  Medication Sig Dispense Refill Last Dose  . acetaminophen (TYLENOL) 325 MG tablet Take 650 mg by mouth every 6 (six) hours as needed for mild pain.    11/16/2014 at Unknown time  . Prenatal Vit-Fe Fumarate-FA (PRENATAL MULTIVITAMIN) TABS tablet Take 1 tablet by mouth daily at 12 noon.    11/16/2014 at Unknown time  . promethazine (PHENERGAN) 25 MG tablet Take 1 tablet (25 mg total) by mouth every 6 (six) hours as needed for nausea or vomiting. 20 tablet 0 11/16/2014 at Unknown time  . ranitidine (ZANTAC) 150 MG tablet Take 1 tablet (150 mg total) by mouth 2 (two) times daily. 60 tablet 1 11/16/2014 at Unknown time  . docusate sodium (COLACE) 100 MG capsule Take 1 capsule (100 mg total) by mouth 2 (two) times daily. 10 capsule 0   . ibuprofen (ADVIL,MOTRIN) 600 MG tablet Take 1 tablet (600 mg total) by mouth every 6 (six) hours. 30 tablet 0   . oxyCODONE-acetaminophen (PERCOCET/ROXICET) 5-325 MG per tablet Take 1 tablet by mouth every 4 (four) hours as needed (for pain scale 4-7). 15 tablet 0     Review of Systems  Constitutional: Negative for fever and malaise/fatigue.  Cardiovascular: Positive for chest pain.  Gastrointestinal: Positive for nausea and vomiting. Negative for heartburn, abdominal pain, diarrhea and constipation.  Neurological: Positive for tingling.   Physical Exam   Blood pressure 139/76, pulse 65, temperature 98.4 F (36.9 C), temperature source Oral, resp. rate 20, height 4\' 11"  (1.499 m), weight 111 lb (50.349 kg), SpO2  99 %, unknown if currently breastfeeding.  Physical Exam  Nursing note and vitals reviewed. Constitutional: She is oriented to person, place, and time. She appears well-developed and well-nourished. No distress.  HENT:  Head: Normocephalic and atraumatic.  Cardiovascular: Normal rate.   Respiratory: Effort normal. She exhibits tenderness (mid sternal tenderness to palpation). She exhibits no mass, no bony tenderness, no edema and no swelling. Right breast exhibits no inverted nipple, no nipple discharge, no skin change and no tenderness. Left breast exhibits no inverted nipple, no nipple discharge, no skin change and no tenderness.  GI: Soft. She exhibits no distension and no mass. There is no tenderness. There is no rebound and no guarding.   Neurological: She is alert and oriented to person, place, and time.  Skin: Skin is warm and dry. No erythema.  Psychiatric: She has a normal mood and affect.   Results for orders placed or performed during the hospital encounter of 11/16/14 (from the past 24 hour(s))  Troponin I     Status: Abnormal   Collection Time: 11/16/14 11:00 PM  Result Value Ref Range   Troponin I 32.13 (HH) <0.031 ng/mL   Dg Chest 2 View  11/16/2014   CLINICAL DATA:  Acute onset of anterior chest pain. Initial encounter.  EXAM: CHEST  2 VIEW  COMPARISON:  Chest radiograph performed 04/21/2013  FINDINGS: The lungs are well-aerated and clear. There is no evidence of focal opacification, pleural effusion or pneumothorax.  The heart is normal in size; the mediastinal contour is within normal limits. No acute osseous abnormalities are seen.  IMPRESSION: No acute cardiopulmonary process seen.   Electronically Signed   By: Roanna Raider M.D.   On: 11/16/2014 23:57     MAU Course  Procedures EKG -  Normal sinus rhythm with sinus arrhythmia Possible Left atrial enlargement Septal infarct , age undetermined T wave abnormality, consider inferolateral ischemia Abnormal ECG  MDM GI cocktail today - patient denies improvement in pain Discussed patient with Dr. Adrian Blackwater. He agrees with plan to get EKG, Chest Xray and Troponin today Discussed abnormal labs and patient presentation/VS with Dr. Micheline Maze at [facility]. She accepts transfer.  Dr Adrian Blackwater discussed with Dr. Micheline Maze and agreed with plan to give therapeutic dose of Lovenox while in MAU prior to transfer IV NS ordered and started [facility] called for transfer  Dr. Adrian Blackwater to MAU to discuss results and plan with the patient. Recommends 2 mg Morphine given prior to transfer. Medication ordered Assessment and Plan  A: PPD # 10 Chest pain   P: Transfer to [facility] for further evaluation and treatment  Marny Lowenstein, PA-C  11/17/2014, 1:08 AM

## 2014-11-16 NOTE — MAU Note (Signed)
Pt reports she has really bad chest pain that is making her throat hurt. States the pain makes her feel short of breath. S/p vaginal delivery on 05/20

## 2014-11-17 ENCOUNTER — Other Ambulatory Visit (HOSPITAL_COMMUNITY): Payer: Medicaid Other

## 2014-11-17 ENCOUNTER — Encounter (HOSPITAL_COMMUNITY)
Admission: AD | Disposition: A | Discharge status: Home / Self Care | Payer: Medicaid Other | Source: Ambulatory Visit | Attending: Cardiovascular Disease

## 2014-11-17 ENCOUNTER — Encounter (HOSPITAL_COMMUNITY): Payer: Self-pay | Admitting: *Deleted

## 2014-11-17 ENCOUNTER — Encounter (HOSPITAL_COMMUNITY)
Admission: AD | Disposition: A | Discharge status: Home / Self Care | Payer: Self-pay | Source: Ambulatory Visit | Attending: Cardiovascular Disease

## 2014-11-17 ENCOUNTER — Ambulatory Visit (INDEPENDENT_AMBULATORY_CARE_PROVIDER_SITE_OTHER): Payer: Medicaid Other

## 2014-11-17 DIAGNOSIS — Z87891 Personal history of nicotine dependence: Secondary | ICD-10-CM | POA: Diagnosis not present

## 2014-11-17 DIAGNOSIS — I2102 ST elevation (STEMI) myocardial infarction involving left anterior descending coronary artery: Secondary | ICD-10-CM | POA: Diagnosis present

## 2014-11-17 DIAGNOSIS — I249 Acute ischemic heart disease, unspecified: Secondary | ICD-10-CM | POA: Diagnosis not present

## 2014-11-17 DIAGNOSIS — I2510 Atherosclerotic heart disease of native coronary artery without angina pectoris: Secondary | ICD-10-CM

## 2014-11-17 DIAGNOSIS — K5900 Constipation, unspecified: Secondary | ICD-10-CM | POA: Diagnosis not present

## 2014-11-17 DIAGNOSIS — I209 Angina pectoris, unspecified: Secondary | ICD-10-CM

## 2014-11-17 DIAGNOSIS — E785 Hyperlipidemia, unspecified: Secondary | ICD-10-CM | POA: Diagnosis not present

## 2014-11-17 DIAGNOSIS — R079 Chest pain, unspecified: Secondary | ICD-10-CM | POA: Diagnosis present

## 2014-11-17 DIAGNOSIS — I213 ST elevation (STEMI) myocardial infarction of unspecified site: Secondary | ICD-10-CM | POA: Diagnosis present

## 2014-11-17 DIAGNOSIS — O9089 Other complications of the puerperium, not elsewhere classified: Secondary | ICD-10-CM | POA: Diagnosis present

## 2014-11-17 DIAGNOSIS — I10 Essential (primary) hypertension: Secondary | ICD-10-CM | POA: Diagnosis not present

## 2014-11-17 DIAGNOSIS — I2542 Coronary artery dissection: Secondary | ICD-10-CM | POA: Diagnosis present

## 2014-11-17 HISTORY — PX: CARDIAC CATHETERIZATION: SHX172

## 2014-11-17 LAB — BASIC METABOLIC PANEL
ANION GAP: 10 (ref 5–15)
ANION GAP: 11 (ref 5–15)
ANION GAP: 9 (ref 5–15)
BUN: 9 mg/dL (ref 6–20)
BUN: 7 mg/dL (ref 6–20)
BUN: 7 mg/dL (ref 6–20)
CO2: 18 mmol/L — ABNORMAL LOW (ref 22–32)
CO2: 18 mmol/L — ABNORMAL LOW (ref 22–32)
CO2: 20 mmol/L — ABNORMAL LOW (ref 22–32)
CREATININE: 0.57 mg/dL (ref 0.44–1.00)
CREATININE: 0.6 mg/dL (ref 0.44–1.00)
Calcium: 8.4 mg/dL — ABNORMAL LOW (ref 8.9–10.3)
Calcium: 8.2 mg/dL — ABNORMAL LOW (ref 8.9–10.3)
Calcium: 8.3 mg/dL — ABNORMAL LOW (ref 8.9–10.3)
Chloride: 112 mmol/L — ABNORMAL HIGH (ref 101–111)
Chloride: 109 mmol/L (ref 101–111)
Chloride: 110 mmol/L (ref 101–111)
Creatinine, Ser: 0.64 mg/dL (ref 0.44–1.00)
GFR calc non Af Amer: >60 mL/min (ref >60)
GFR calc non Af Amer: >60 mL/min (ref >60)
GLUCOSE: 87 mg/dL (ref 65–99)
Glucose, Bld: 88 mg/dL (ref 65–99)
Glucose, Bld: 94 mg/dL (ref 65–99)
POTASSIUM: 4 mmol/L (ref 3.5–5.1)
POTASSIUM: 3.6 mmol/L (ref 3.5–5.1)
POTASSIUM: 3.7 mmol/L (ref 3.5–5.1)
Sodium: 140 mmol/L (ref 135–145)
Sodium: 138 mmol/L (ref 135–145)
Sodium: 139 mmol/L (ref 135–145)

## 2014-11-17 LAB — CBC WITH DIFFERENTIAL/PLATELET
BASOS PCT: 0 % (ref 0–1)
Basophils Absolute: 0 10*3/uL (ref 0.0–0.1)
Eosinophils Absolute: 0 10*3/uL (ref 0.0–0.7)
Eosinophils Relative: 0 % (ref 0–5)
HCT: 36.5 % (ref 36.0–46.0)
HEMOGLOBIN: 11.4 g/dL — AB (ref 12.0–15.0)
Lymphocytes Relative: 21 % (ref 12–46)
Lymphs Abs: 1.6 10*3/uL (ref 0.7–4.0)
MCH: 27.8 pg (ref 26.0–34.0)
MCHC: 31.2 g/dL (ref 30.0–36.0)
MCV: 89 fL (ref 78.0–100.0)
MONOS PCT: 6 % (ref 3–12)
Monocytes Absolute: 0.5 10*3/uL (ref 0.1–1.0)
Neutro Abs: 5.6 10*3/uL (ref 1.7–7.7)
Neutrophils Relative %: 73 % (ref 43–77)
Platelets: 314 10*3/uL (ref 150–400)
RBC: 4.1 MIL/uL (ref 3.87–5.11)
RDW: 15.5 % (ref 11.5–15.5)
WBC: 7.7 10*3/uL (ref 4.0–10.5)

## 2014-11-17 LAB — CBC
HCT: 35.1 % — ABNORMAL LOW (ref 36.0–46.0)
HEMATOCRIT: 35.4 % — AB (ref 36.0–46.0)
HEMOGLOBIN: 11.2 g/dL — AB (ref 12.0–15.0)
Hemoglobin: 11.1 g/dL — ABNORMAL LOW (ref 12.0–15.0)
MCH: 27.8 pg (ref 26.0–34.0)
MCH: 27.7 pg (ref 26.0–34.0)
MCHC: 31.6 g/dL (ref 30.0–36.0)
MCHC: 31.6 g/dL (ref 30.0–36.0)
MCV: 87.8 fL (ref 78.0–100.0)
MCV: 87.5 fL (ref 78.0–100.0)
PLATELETS: 343 10*3/uL (ref 150–400)
Platelets: 342 10*3/uL (ref 150–400)
RBC: 4.01 MIL/uL (ref 3.87–5.11)
RBC: 4.03 MIL/uL (ref 3.87–5.11)
RDW: 15.4 % (ref 11.5–15.5)
RDW: 15.5 % (ref 11.5–15.5)
WBC: 7.7 10*3/uL (ref 4.0–10.5)
WBC: 8.5 10*3/uL (ref 4.0–10.5)

## 2014-11-17 LAB — LIPID PANEL
CHOL/HDL RATIO: 6 ratio
CHOLESTEROL: 264 mg/dL — AB (ref 0–200)
HDL: 44 mg/dL (ref >40)
LDL CALC: 191 mg/dL — AB (ref 0–99)
Triglycerides: 143 mg/dL (ref <150)
VLDL: 29 mg/dL (ref 0–40)

## 2014-11-17 LAB — TROPONIN I
Troponin I: 32.13 ng/mL (ref <0.031)
Troponin I: >65 ng/mL (ref <0.031)
Troponin I: 37.03 ng/mL (ref <0.031)

## 2014-11-17 LAB — MRSA PCR SCREENING: MRSA by PCR: NEGATIVE

## 2014-11-17 LAB — POCT ACTIVATED CLOTTING TIME
ACTIVATED CLOTTING TIME: 208 s
Activated Clotting Time: 110 seconds

## 2014-11-17 LAB — BRAIN NATRIURETIC PEPTIDE: B NATRIURETIC PEPTIDE 5: 275.2 pg/mL — AB (ref 0.0–100.0)

## 2014-11-17 LAB — PROTIME-INR
INR: 1.12 (ref 0.00–1.49)
Prothrombin Time: 14.6 seconds (ref 11.6–15.2)

## 2014-11-17 SURGERY — LEFT HEART CATH AND CORONARY ANGIOGRAPHY

## 2014-11-17 MED ORDER — HYDROMORPHONE HCL 1 MG/ML IJ SOLN
1.0000 mg | INTRAMUSCULAR | Status: DC | PRN
  Administered 2014-11-17 (×2): 2 mg via INTRAVENOUS
  Administered 2014-11-17: 1 mg via INTRAVENOUS
  Administered 2014-11-17 (×2): 2 mg via INTRAVENOUS
  Administered 2014-11-18: 1 mg via INTRAVENOUS
  Administered 2014-11-18 (×5): 2 mg via INTRAVENOUS
  Administered 2014-11-18 – 2014-11-19 (×4): 1 mg via INTRAVENOUS
  Administered 2014-11-19: 2 mg via INTRAVENOUS
  Administered 2014-11-19 (×4): 1 mg via INTRAVENOUS
  Administered 2014-11-19 – 2014-11-20 (×3): 2 mg via INTRAVENOUS
  Administered 2014-11-20 (×2): 1 mg via INTRAVENOUS
  Administered 2014-11-20: 2 mg via INTRAVENOUS
  Filled 2014-11-17 (×3): qty 1
  Filled 2014-11-17: qty 2
  Filled 2014-11-17: qty 1
  Filled 2014-11-17 (×5): qty 2
  Filled 2014-11-17 (×2): qty 1
  Filled 2014-11-17 (×4): qty 2
  Filled 2014-11-17 (×2): qty 1
  Filled 2014-11-17: qty 2
  Filled 2014-11-17: qty 1
  Filled 2014-11-17 (×2): qty 2
  Filled 2014-11-17: qty 1
  Filled 2014-11-17: qty 2
  Filled 2014-11-17: qty 1
  Filled 2014-11-17: qty 2
  Filled 2014-11-17 (×2): qty 1

## 2014-11-17 MED ORDER — LIDOCAINE HCL (PF) 1 % IJ SOLN
INTRAMUSCULAR | Status: AC
  Filled 2014-11-17: qty 30

## 2014-11-17 MED ORDER — FENTANYL CITRATE (PF) 100 MCG/2ML IJ SOLN
INTRAMUSCULAR | Status: AC
  Filled 2014-11-17: qty 2

## 2014-11-17 MED ORDER — HEPARIN (PORCINE) IN NACL 100-0.45 UNIT/ML-% IJ SOLN
INTRAMUSCULAR | Status: DC | PRN
  Administered 2014-11-17: 800 [IU]/h via INTRAVENOUS

## 2014-11-17 MED ORDER — DIAZEPAM 5 MG/ML IJ SOLN
2.5000 mg | INTRAMUSCULAR | Status: AC | PRN
  Administered 2014-11-17 – 2014-11-18 (×5): 2.5 mg via INTRAVENOUS
  Filled 2014-11-17 (×5): qty 2

## 2014-11-17 MED ORDER — FENTANYL CITRATE (PF) 100 MCG/2ML IJ SOLN
50.0000 ug | INTRAMUSCULAR | Status: DC | PRN
  Administered 2014-11-17 – 2014-11-20 (×7): 50 ug via INTRAVENOUS
  Filled 2014-11-17 (×6): qty 2

## 2014-11-17 MED ORDER — ASPIRIN 325 MG PO TABS: 325.0000 mg | ORAL_TABLET | Freq: Once | ORAL | Status: AC

## 2014-11-17 MED ORDER — TIROFIBAN (AGGRASTAT) BOLUS VIA INFUSION
INTRAVENOUS | Status: DC | PRN
  Administered 2014-11-17: 1257.5 ug via INTRAVENOUS

## 2014-11-17 MED ORDER — SODIUM CHLORIDE 0.9 % IJ SOLN
3.0000 mL | Freq: Two times a day (BID) | INTRAMUSCULAR | Status: DC
  Administered 2014-11-17 – 2014-11-19 (×5): 3 mL via INTRAVENOUS

## 2014-11-17 MED ORDER — METOPROLOL TARTRATE 1 MG/ML IV SOLN
2.5000 mg | Freq: Once | INTRAVENOUS | Status: AC
  Administered 2014-11-17: 2.5 mg via INTRAVENOUS

## 2014-11-17 MED ORDER — LIDOCAINE HCL (PF) 1 % IJ SOLN
INTRAMUSCULAR | Status: DC | PRN
  Administered 2014-11-17: 30 mL via INTRADERMAL

## 2014-11-17 MED ORDER — HEPARIN (PORCINE) IN NACL 2-0.9 UNIT/ML-% IJ SOLN
INTRAMUSCULAR | Status: AC
  Filled 2014-11-17: qty 1000

## 2014-11-17 MED ORDER — CLOPIDOGREL BISULFATE 75 MG PO TABS
ORAL_TABLET | ORAL | Status: AC
  Filled 2014-11-17: qty 1

## 2014-11-17 MED ORDER — ONDANSETRON HCL 4 MG/2ML IJ SOLN
4.0000 mg | Freq: Four times a day (QID) | INTRAMUSCULAR | Status: DC | PRN
  Administered 2014-11-17: 4 mg via INTRAVENOUS
  Filled 2014-11-17: qty 2

## 2014-11-17 MED ORDER — OXYCODONE-ACETAMINOPHEN 5-325 MG PO TABS
1.0000 | ORAL_TABLET | ORAL | Status: DC | PRN
  Administered 2014-11-17 – 2014-11-21 (×14): 2 via ORAL
  Filled 2014-11-17 (×14): qty 2

## 2014-11-17 MED ORDER — TIROFIBAN HCL IV 12.5 MG/250 ML
0.1500 ug/kg/min | INTRAVENOUS | Status: DC
  Administered 2014-11-17: 0.15 ug/kg/min via INTRAVENOUS
  Filled 2014-11-17 (×2): qty 250

## 2014-11-17 MED ORDER — SODIUM CHLORIDE 0.9 % IJ SOLN
3.0000 mL | Freq: Two times a day (BID) | INTRAMUSCULAR | Status: DC
  Administered 2014-11-17 – 2014-11-22 (×7): 3 mL via INTRAVENOUS

## 2014-11-17 MED ORDER — FENTANYL CITRATE (PF) 100 MCG/2ML IJ SOLN
INTRAMUSCULAR | Status: DC | PRN
  Administered 2014-11-17: 25 ug via INTRAVENOUS

## 2014-11-17 MED ORDER — CLOPIDOGREL BISULFATE 75 MG PO TABS
75.0000 mg | ORAL_TABLET | Freq: Every day | ORAL | Status: DC
  Administered 2014-11-18 – 2014-11-22 (×5): 75 mg via ORAL
  Filled 2014-11-17 (×7): qty 1

## 2014-11-17 MED ORDER — MORPHINE SULFATE 2 MG/ML IJ SOLN
2.0000 mg | Freq: Once | INTRAMUSCULAR | Status: AC
  Administered 2014-11-17: 2 mg via INTRAVENOUS

## 2014-11-17 MED ORDER — ATORVASTATIN CALCIUM 80 MG PO TABS
80.0000 mg | ORAL_TABLET | Freq: Every day | ORAL | Status: DC
  Administered 2014-11-17 – 2014-11-21 (×5): 80 mg via ORAL
  Filled 2014-11-17 (×7): qty 1

## 2014-11-17 MED ORDER — SODIUM CHLORIDE 0.9 % IV SOLN
INTRAVENOUS | Status: DC
  Administered 2014-11-17: 02:00:00 via INTRAVENOUS

## 2014-11-17 MED ORDER — NITROGLYCERIN 0.4 MG SL SUBL: 0.4000 mg | SUBLINGUAL_TABLET | SUBLINGUAL | Status: DC | PRN

## 2014-11-17 MED ORDER — SODIUM CHLORIDE 0.9 % IJ SOLN: 3.0000 mL | INTRAMUSCULAR | Status: DC | PRN

## 2014-11-17 MED ORDER — LIDOCAINE HCL (PF) 1 % IJ SOLN
INTRAMUSCULAR | Status: DC | PRN
  Administered 2014-11-17: 20 mL via INTRADERMAL

## 2014-11-17 MED ORDER — NITROGLYCERIN 1 MG/10 ML FOR IR/CATH LAB
INTRA_ARTERIAL | Status: AC
  Filled 2014-11-17: qty 10

## 2014-11-17 MED ORDER — ACETAMINOPHEN 325 MG PO TABS: 650.0000 mg | ORAL_TABLET | ORAL | Status: DC | PRN

## 2014-11-17 MED ORDER — FENTANYL CITRATE (PF) 100 MCG/2ML IJ SOLN
50.0000 ug | Freq: Once | INTRAMUSCULAR | Status: AC
  Administered 2014-11-17: 50 ug via INTRAVENOUS
  Filled 2014-11-17: qty 2

## 2014-11-17 MED ORDER — MORPHINE SULFATE 2 MG/ML IJ SOLN
2.0000 mg | INTRAMUSCULAR | Status: DC | PRN
  Administered 2014-11-17 – 2014-11-19 (×9): 2 mg via INTRAVENOUS
  Filled 2014-11-17 (×10): qty 1

## 2014-11-17 MED ORDER — HEPARIN (PORCINE) IN NACL 2-0.9 UNIT/ML-% IJ SOLN
INTRAMUSCULAR | Status: AC
  Filled 2014-11-17: qty 1500

## 2014-11-17 MED ORDER — NITROGLYCERIN IN D5W 200-5 MCG/ML-% IV SOLN
INTRAVENOUS | Status: AC
  Filled 2014-11-17: qty 250

## 2014-11-17 MED ORDER — FENTANYL CITRATE (PF) 100 MCG/2ML IJ SOLN
INTRAMUSCULAR | Status: DC | PRN
  Administered 2014-11-17: 50 ug via INTRAVENOUS
  Administered 2014-11-17: 25 ug via INTRAVENOUS

## 2014-11-17 MED ORDER — MIDAZOLAM HCL 2 MG/2ML IJ SOLN
INTRAMUSCULAR | Status: AC
  Filled 2014-11-17: qty 2

## 2014-11-17 MED ORDER — METOPROLOL TARTRATE 12.5 MG HALF TABLET
12.5000 mg | ORAL_TABLET | Freq: Two times a day (BID) | ORAL | Status: DC
  Filled 2014-11-17 (×2): qty 1

## 2014-11-17 MED ORDER — MIDAZOLAM HCL 2 MG/2ML IJ SOLN
INTRAMUSCULAR | Status: DC | PRN
  Administered 2014-11-17: 1 mg via INTRAVENOUS

## 2014-11-17 MED ORDER — ONDANSETRON HCL 4 MG/2ML IJ SOLN: 4.0000 mg | Freq: Four times a day (QID) | INTRAMUSCULAR | Status: DC | PRN

## 2014-11-17 MED ORDER — SODIUM CHLORIDE 0.9 % IV SOLN
INTRAVENOUS | Status: AC
  Administered 2014-11-17: 04:00:00 via INTRAVENOUS

## 2014-11-17 MED ORDER — NITROGLYCERIN IN D5W 200-5 MCG/ML-% IV SOLN: 0.0000 ug/min | INTRAVENOUS | Status: DC

## 2014-11-17 MED ORDER — TIROFIBAN HCL IV 5 MG/100ML
INTRAVENOUS | Status: DC | PRN
  Administered 2014-11-17: 0.15 ug/kg/min via INTRAVENOUS

## 2014-11-17 MED ORDER — NITROGLYCERIN IN D5W 200-5 MCG/ML-% IV SOLN
0.0000 ug/min | INTRAVENOUS | Status: DC
  Administered 2014-11-17: 18 ug/min via INTRAVENOUS
  Administered 2014-11-17: 200 ug/min via INTRAVENOUS
  Administered 2014-11-17: 100 ug/min via INTRAVENOUS
  Administered 2014-11-18: 50 ug/min via INTRAVENOUS
  Administered 2014-11-19: 20 ug/min via INTRAVENOUS
  Filled 2014-11-17 (×3): qty 250

## 2014-11-17 MED ORDER — NITROGLYCERIN IN D5W 200-5 MCG/ML-% IV SOLN
0.0000 ug/min | Freq: Once | INTRAVENOUS | Status: AC
  Administered 2014-11-17: 25 ug/min via INTRAVENOUS

## 2014-11-17 MED ORDER — ENOXAPARIN SODIUM 80 MG/0.8ML ~~LOC~~ SOLN
1.5000 mg/kg | Freq: Once | SUBCUTANEOUS | Status: DC
  Filled 2014-11-17: qty 0.8

## 2014-11-17 MED ORDER — SODIUM CHLORIDE 0.9 % IV SOLN: 250.0000 mL | INTRAVENOUS | Status: DC | PRN

## 2014-11-17 MED ORDER — METOPROLOL TARTRATE 12.5 MG HALF TABLET
12.5000 mg | ORAL_TABLET | Freq: Two times a day (BID) | ORAL | Status: DC
  Administered 2014-11-17: 12.5 mg via ORAL
  Filled 2014-11-17 (×3): qty 1

## 2014-11-17 MED ORDER — HEPARIN (PORCINE) IN NACL 100-0.45 UNIT/ML-% IJ SOLN
800.0000 [IU]/h | INTRAMUSCULAR | Status: DC
  Administered 2014-11-17: 800 [IU]/h via INTRAVENOUS
  Filled 2014-11-17: qty 250

## 2014-11-17 MED ORDER — HEPARIN SODIUM (PORCINE) 1000 UNIT/ML IJ SOLN
INTRAMUSCULAR | Status: DC | PRN
  Administered 2014-11-17: 4000 [IU] via INTRAVENOUS

## 2014-11-17 MED ORDER — SODIUM CHLORIDE 0.9 % IV SOLN: INTRAVENOUS | Status: DC

## 2014-11-17 MED ORDER — MORPHINE SULFATE 4 MG/ML IJ SOLN
2.0000 mg | Freq: Once | INTRAMUSCULAR | Status: AC
  Administered 2014-11-17: 2 mg via INTRAVENOUS
  Filled 2014-11-17: qty 1

## 2014-11-17 MED ORDER — IOHEXOL 350 MG/ML SOLN
INTRAVENOUS | Status: DC | PRN
  Administered 2014-11-17: 25 mL via INTRA_ARTERIAL

## 2014-11-17 MED ORDER — NITROGLYCERIN IN D5W 200-5 MCG/ML-% IV SOLN
INTRAVENOUS | Status: AC
Start: 2014-11-17 — End: 2014-11-17
  Administered 2014-11-17: 25 ug/min via INTRAVENOUS
  Filled 2014-11-17: qty 250

## 2014-11-17 MED ORDER — NITROGLYCERIN 1 MG/10 ML FOR IR/CATH LAB
INTRA_ARTERIAL | Status: DC | PRN
  Administered 2014-11-17: 200 ug via INTRACORONARY

## 2014-11-17 MED ORDER — MIDAZOLAM HCL 2 MG/2ML IJ SOLN
INTRAMUSCULAR | Status: DC | PRN
  Administered 2014-11-17: 2 mg via INTRAVENOUS

## 2014-11-17 MED ORDER — FENTANYL CITRATE (PF) 100 MCG/2ML IJ SOLN
50.0000 ug | Freq: Once | INTRAMUSCULAR | Status: AC
  Administered 2014-11-17: 50 ug via INTRAVENOUS

## 2014-11-17 MED ORDER — SODIUM CHLORIDE 0.9 % IV SOLN
250.0000 mL | INTRAVENOUS | Status: DC | PRN
  Administered 2014-11-18: 250 mL via INTRAVENOUS

## 2014-11-17 MED ORDER — ASPIRIN EC 81 MG PO TBEC
81.0000 mg | DELAYED_RELEASE_TABLET | Freq: Every day | ORAL | Status: DC
  Administered 2014-11-18 – 2014-11-22 (×5): 81 mg via ORAL
  Filled 2014-11-17 (×5): qty 1

## 2014-11-17 MED ORDER — METOPROLOL TARTRATE 1 MG/ML IV SOLN
INTRAVENOUS | Status: AC
  Filled 2014-11-17: qty 5

## 2014-11-17 MED ORDER — TIROFIBAN HCL IV 5 MG/100ML
INTRAVENOUS | Status: AC
  Filled 2014-11-17: qty 100

## 2014-11-17 SURGICAL SUPPLY — 10 items
CATH INFINITI 5FR JL4 (CATHETERS) ×3 IMPLANT
CATH INFINITI 5FR MULTPACK ANG (CATHETERS) IMPLANT
KIT HEART LEFT (KITS) ×3 IMPLANT
PACK CARDIAC CATHETERIZATION (CUSTOM PROCEDURE TRAY) ×3 IMPLANT
SHEATH PINNACLE 6F 10CM (SHEATH) ×3 IMPLANT
SYR MEDRAD MARK V 150ML (SYRINGE) ×3 IMPLANT
TRANSDUCER W/STOPCOCK (MISCELLANEOUS) ×3 IMPLANT
TUBING CIL FLEX 10 FLL-RA (TUBING) ×3 IMPLANT
WIRE EMERALD 3MM-J .035X150CM (WIRE) ×3 IMPLANT
WIRE SAFE-T 1.5MM-J .035X260CM (WIRE) ×3 IMPLANT

## 2014-11-17 SURGICAL SUPPLY — 9 items
CATH INFINITI 5FR MULTPACK ANG (CATHETERS) ×3 IMPLANT
CATH VISTA GUIDE 6FR XBLAD3.5 (CATHETERS) ×3 IMPLANT
KIT ENCORE 26 ADVANTAGE (KITS) ×3 IMPLANT
KIT HEART LEFT (KITS) ×3 IMPLANT
PACK CARDIAC CATHETERIZATION (CUSTOM PROCEDURE TRAY) ×3 IMPLANT
SHEATH PINNACLE 6F 10CM (SHEATH) ×3 IMPLANT
TRANSDUCER W/STOPCOCK (MISCELLANEOUS) ×3 IMPLANT
TUBING CIL FLEX 10 FLL-RA (TUBING) ×6 IMPLANT
WIRE .035 3MM-J 145CM (WIRE) ×3 IMPLANT

## 2014-11-17 NOTE — ED Notes (Signed)
Pt. Has received 324mg  of baby aspirin and 3 sublingual nitro with no relief pain is still 10/10 substernal chest pain. Pt. Just had a baby a few days ago.

## 2014-11-17 NOTE — Progress Notes (Addendum)
Patient Name: Jenny Myers Date of Encounter: 11/17/2014  Active Problems:   Chest pain   ST elevation myocardial infarction involving left anterior descending (LAD) coronary artery   Acute coronary syndrome   STEMI (ST elevation myocardial infarction)   Length of Stay: 0  SUBJECTIVE  The patient is crying from pain, retrosternal, severe 10/10 with SOB.   CURRENT MEDS . [START ON 11/18/2014] aspirin EC  81 mg Oral Daily  . atorvastatin  80 mg Oral q1800  . fentaNYL      . metoprolol tartrate  12.5 mg Oral BID  . sodium chloride  3 mL Intravenous Q12H   OBJECTIVE  Filed Vitals:   11/17/14 0500 11/17/14 0600 11/17/14 0615 11/17/14 0730  BP: 123/57 114/75  133/84  Pulse: 94 79 93 99  Temp:    97.9 F (36.6 C)  TempSrc:    Oral  Resp: 14 15 24 17   Height:      Weight:      SpO2: 94% 96% 100% 99%    Intake/Output Summary (Last 24 hours) at 11/17/14 0850 Last data filed at 11/17/14 0700  Gross per 24 hour  Intake 409.83 ml  Output    275 ml  Net 134.83 ml   Filed Weights   11/16/14 2142 11/17/14 0400  Weight: 111 lb (50.349 kg) 111 lb 15.9 oz (50.8 kg)    PHYSICAL EXAM  General: Pleasant, NAD. Neuro: Alert and oriented X 3. Moves all extremities spontaneously. Psych: Normal affect. HEENT:  Normal  Neck: Supple without bruits or JVD. Lungs:  Resp regular and unlabored, CTA. Heart: RRR no s3, s4, or murmurs. Abdomen: Soft, non-tender, non-distended, BS + x 4.  Extremities: No clubbing, cyanosis or edema. DP/PT/Radials 2+ and equal bilaterally.  Accessory Clinical Findings  CBC  Recent Labs  11/17/14 0204 11/17/14 0431 11/17/14 0522  WBC 7.7 8.5 7.7  NEUTROABS 5.6  --   --   HGB 11.4* 11.2* 11.1*  HCT 36.5 35.4* 35.1*  MCV 89.0 87.8 87.5  PLT 314 342 343   Basic Metabolic Panel  Recent Labs  11/17/14 0431 11/17/14 0522  NA 138 139  K 3.7 3.6  CL 109 110  CO2 18* 20*  GLUCOSE 88 94  BUN 7 7  CREATININE 0.57 0.60  CALCIUM 8.2* 8.3*     Recent Labs  11/16/14 2300 11/17/14 0522  TROPONINI 32.13* >65.00*    Recent Labs  11/17/14 0522  CHOL 264*  HDL 44  LDLCALC 191*  TRIG 143  CHOLHDL 6.0   Radiology/Studies  Dg Chest 2 View  11/16/2014   CLINICAL DATA:  Acute onset of anterior chest pain. Initial encounter.  EXAM: CHEST  2 VIEW  COMPARISON:  Chest radiograph performed 04/21/2013  FINDINGS: The lungs are well-aerated and clear. There is no evidence of focal opacification, pleural effusion or pneumothorax.  The heart is normal in size; the mediastinal contour is within normal limits. No acute osseous abnormalities are seen.  IMPRESSION: No acute cardiopulmonary process seen.   Electronically Signed   By: Roanna RaiderJeffery  Chang M.D.   On: 11/16/2014 23:57   TELE: SR  ECG: SR, inferior STE, resolved anterior changes   ASSESSMENT AND PLAN  24- year-old female 10 day post uncomplicated vaginal delivery presented with chest pain that started at 7 pm the last night and was found to have inferior and anterior STE and initial troponin 32-> 65. 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 The images were reviewed and discussed in details with Dr Tresa Endo, Dr Clifton James and Dr Riley Kill. It was decided to discontinue Aggrastat and heparin drip and bring her back to the cath lab for IVUS and re-imaging.  Dr Laneta Simmers was notified of the case, however the surgical targets are poor.  For now we will continue NTG drip, morphine for pain control, aspirin,  atorvastatin and low dose metoprolol.  Echocardiogram is pending.  Signed, Lars Masson MD, [facility] 11/17/2014

## 2014-11-17 NOTE — ED Notes (Signed)
Cardiology at bedside.

## 2014-11-17 NOTE — Care Management Note (Signed)
Case Management Note  Patient Details  Name: Darrelyn HillockLatrice Self MRN: [id number] Date of Birth: 11/05/1990  Subjective/Objective:    Adm w mi                Action/Plan: lives w fam   Expected Discharge Date:  11/19/14               Expected Discharge Plan:  Home/Self Care  In-House Referral:     Discharge planning Services     Post Acute Care Choice:    Choice offered to:     DME Arranged:    DME Agency:     HH Arranged:    HH Agency:     Status of Service:     Medicare Important Message Given:    Date Medicare IM Given:    Medicare IM give by:    Date Additional Medicare IM Given:    Additional Medicare Important Message give by:     If discussed at Long Length of Stay Meetings, dates discussed:    Additional Comments: ur review done  Hanley HaysDowell, Emil Weigold T, RN 11/17/2014, 10:26 AM

## 2014-11-17 NOTE — ED Provider Notes (Signed)
CSN: [id number]     Arrival date & time 11/16/14  2136 History  This chart was scribed for Toy Cookey, MD by Abel Presto, ED Scribe. This patient was seen in room D32C/D32C and the patient's care was started at 1:45 AM.    Chief Complaint  Patient presents with  . Code STEMI     Patient is a 24 y.o. female presenting with chest pain. The history is provided by the patient. No language interpreter was used.  Chest Pain Pain location:  Substernal area Pain quality: sharp and stabbing   Pain radiates to:  Neck Pain radiates to the back: no   Pain severity:  Severe Onset quality:  Sudden Duration:  6 hours Timing:  Constant Progression:  Worsening Chronicity:  New Context: breathing   Relieved by:  Nothing Worsened by:  Deep breathing Ineffective treatments: ibuprofen. Associated symptoms: cough, nausea, shortness of breath and vomiting   Associated symptoms: no abdominal pain, no back pain, no diaphoresis, no fatigue, no fever, no headache, no numbness, no palpitations, no syncope and no weakness   Shortness of breath:    Severity:  Moderate   Onset quality:  Sudden   Duration:  7 hours   Timing:  Constant   Progression:  Unchanged Vomiting:    Quality:  Unable to specify   Number of occurrences:  1   Severity:  Mild   Timing:  Rare   Progression:  Unchanged Risk factors: pregnancy (post-partum)   Risk factors: no aortic disease, no birth control, no coronary artery disease, no diabetes mellitus, no high cholesterol, no hypertension, no immobilization, not obese, no prior DVT/PE and no smoking    HPI Comments: Lamesha Tibbits is a 24 y.o. female brought in by ambulance (transfer from [facility]), who presents to the Emergency Department complaining of intermittent sharp stabbing central chest pain for 10 days worsening to constant this evening around 7 PM. She notes pain radiates to throat and down arms. Pt notes associated mild constant SOB, cough, nausea and  vomiting once with onset all at 8PM.  She states deep inspiration aggravates the pain. Pt has taken ibuprofen with relief before this evening. She denies similar symptoms in the past. Pt is 10 days post-partum. Delivery was vaginal with no complications. Pt is not on bcp. She denies any recent prolonged travel and significant PMHx. Pt is a former smoker but denies drug use. Pt denies bilateral LE swelling, pain, and lightheadedness or near syncope.   OB History    Gravida Para Term Preterm AB TAB SAB Ectopic Multiple Living   6 3 3  0 3 1 1 1  0 3       Past Medical History  Diagnosis Date  . Ectopic pregnancy 11/2010    Right salpingectomy  . Umbilical hernia   . Infection     chlamydia   Past Surgical History  Procedure Laterality Date  . Salpingectomy  2012    left ovary, ruptured ectopic  . Wisdom tooth extraction    . Induced abortion    . Cardiac catheterization N/A 11/17/2014    Procedure: Left Heart Cath and Coronary Angiography;  Surgeon: Lennette Bihari, MD;  Location: [facility] INVASIVE CV LAB;  Service: Cardiovascular;  Laterality: N/A;  . Cardiac catheterization N/A 11/17/2014    Procedure: Left Heart Cath and Coronary Angiography;  Surgeon: Lennette Bihari, MD;  Location: [facility] INVASIVE CV LAB;  Service: Cardiovascular;  Laterality: N/A;   Family History  Problem Relation Age of Onset  .  Hypertension Mother   . Diabetes Father   . Hypertension Father   . Cancer Father     breast  . Vision loss Paternal Grandmother   . Other Neg Hx    History  Substance Use Topics  . Smoking status: Former Games developer  . Smokeless tobacco: Never Used     Comment: quit with preg  . Alcohol Use: No     Comment: not since pregnancy   OB History    Gravida Para Term Preterm AB TAB SAB Ectopic Multiple Living   0 0 3     Review of Systems  Constitutional: Negative for fever, chills, diaphoresis, activity change, appetite change and fatigue.  HENT: Negative for congestion, facial  swelling, rhinorrhea and sore throat.   Eyes: Negative for photophobia and discharge.  Respiratory: Positive for cough and shortness of breath. Negative for chest tightness.   Cardiovascular: Positive for chest pain. Negative for palpitations, leg swelling and syncope.  Gastrointestinal: Positive for nausea and vomiting. Negative for abdominal pain and diarrhea.  Endocrine: Negative for polydipsia and polyuria.  Genitourinary: Negative for dysuria, frequency, difficulty urinating and pelvic pain.  Musculoskeletal: Negative for back pain, arthralgias, neck pain and neck stiffness.  Skin: Negative for color change and wound.  Allergic/Immunologic: Negative for immunocompromised state.  Neurological: Negative for syncope, facial asymmetry, weakness, light-headedness, numbness and headaches.  Hematological: Does not bruise/bleed easily.  Psychiatric/Behavioral: Negative for confusion and agitation.      Allergies  Garlic; Penicillins; and Carrot flavor  Home Medications   Prior to Admission medications   Medication Sig Start Date End Date Taking? Authorizing Provider  acetaminophen (TYLENOL) 325 MG tablet Take 650 mg by mouth every 6 (six) hours as needed for mild pain.    Yes Historical Provider, MD  Prenatal Vit-Fe Fumarate-FA (PRENATAL MULTIVITAMIN) TABS tablet Take 1 tablet by mouth daily at 12 noon.   Yes Historical Provider, MD  promethazine (PHENERGAN) 25 MG tablet Take 1 tablet (25 mg total) by mouth every 6 (six) hours as needed for nausea or vomiting. 10/18/14  Yes Ashly M Gottschalk, DO  ranitidine (ZANTAC) 150 MG tablet Take 1 tablet (150 mg total) by mouth 2 (two) times daily. 10/18/14  Yes Ashly Hulen Skains, DO  docusate sodium (COLACE) 100 MG capsule Take 1 capsule (100 mg total) by mouth 2 (two) times daily. 11/07/14   Smitty Cords, DO  ibuprofen (ADVIL,MOTRIN) 600 MG tablet Take 1 tablet (600 mg total) by mouth every 6 (six) hours. 11/07/14   Smitty Cords,  DO  oxyCODONE-acetaminophen (PERCOCET/ROXICET) 5-325 MG per tablet Take 1 tablet by mouth every 4 (four) hours as needed (for pain scale 4-7). 11/07/14   Smitty Cords, DO   BP 126/74 mmHg  Pulse 71  Temp(Src) 98.8 F (37.1 C) (Oral)  Resp 19  Ht 5' (1.524 m)  Wt 110 lb 3.7 oz (50 kg)  BMI 21.53 kg/m2  SpO2 100% Physical Exam  Constitutional: She is oriented to person, place, and time. She appears well-developed and well-nourished. No distress.  HENT:  Head: Normocephalic.  Mouth/Throat: Oropharynx is clear and moist.  Eyes: Pupils are equal, round, and reactive to light.  Neck: Neck supple.  Cardiovascular: Normal rate, regular rhythm and normal heart sounds.   Pulmonary/Chest: Effort normal and breath sounds normal. No respiratory distress. She has no wheezes.  Abdominal: Soft. She exhibits no distension. There is no tenderness. There is no rebound and no guarding.  Musculoskeletal: She exhibits no edema or tenderness.  Neurological: She is alert and oriented to person, place, and time.  Skin: Skin is warm and dry.  Psychiatric: She has a normal mood and affect.  Nursing note and vitals reviewed.   ED Course  Procedures (including critical care time) DIAGNOSTIC STUDIES: Oxygen Saturation is 99% on room air, normal by my interpretation.    COORDINATION OF CARE: 11:05 AM Discussed treatment plan with patient at beside, the patient agrees with the plan and has no further questions at this time.   Labs Review Labs Reviewed  TROPONIN I - Abnormal; Notable for the following:    Troponin I 32.13 (*)    All other components within normal limits  CBC WITH DIFFERENTIAL/PLATELET - Abnormal; Notable for the following:    Hemoglobin 11.4 (*)    All other components within normal limits  BASIC METABOLIC PANEL - Abnormal; Notable for the following:    Chloride 112 (*)    CO2 18 (*)    Calcium 8.4 (*)    All other components within normal limits  BRAIN NATRIURETIC PEPTIDE  - Abnormal; Notable for the following:    B Natriuretic Peptide 275.2 (*)    All other components within normal limits  CBC - Abnormal; Notable for the following:    Hemoglobin 11.2 (*)    HCT 35.4 (*)    All other components within normal limits  BASIC METABOLIC PANEL - Abnormal; Notable for the following:    CO2 18 (*)    Calcium 8.2 (*)    All other components within normal limits  TROPONIN I - Abnormal; Notable for the following:    Troponin I >65.00 (*)    All other components within normal limits  TROPONIN I - Abnormal; Notable for the following:    Troponin I >65.00 (*)    All other components within normal limits  TROPONIN I - Abnormal; Notable for the following:    Troponin I 37.03 (*)    All other components within normal limits  BASIC METABOLIC PANEL - Abnormal; Notable for the following:    CO2 20 (*)    Calcium 8.3 (*)    All other components within normal limits  LIPID PANEL - Abnormal; Notable for the following:    Cholesterol 264 (*)    LDL Cholesterol 191 (*)    All other components within normal limits  CBC - Abnormal; Notable for the following:    Hemoglobin 11.1 (*)    HCT 35.1 (*)    All other components within normal limits  BASIC METABOLIC PANEL - Abnormal; Notable for the following:    BUN <5 (*)    All other components within normal limits  MRSA PCR SCREENING  PROTIME-INR  CBC  POCT ACTIVATED CLOTTING TIME  POCT ACTIVATED CLOTTING TIME    Imaging Review Dg Chest 2 View  11/16/2014   CLINICAL DATA:  Acute onset of anterior chest pain. Initial encounter.  EXAM: CHEST  2 VIEW  COMPARISON:  Chest radiograph performed 04/21/2013  FINDINGS: The lungs are well-aerated and clear. There is no evidence of focal opacification, pleural effusion or pneumothorax.  The heart is normal in size; the mediastinal contour is within normal limits. No acute osseous abnormalities are seen.  IMPRESSION: No acute cardiopulmonary process seen.   Electronically Signed   By:  Roanna Raider M.D.   On: 11/16/2014 23:57     EKG Interpretation None     CRITICAL CARE Performed by:  Total  critical care time: 30 mins Critical care time was exclusive of separately billable procedures and treating other patients. Critical care was necessary to treat or prevent imminent or life-threatening deterioration. Critical care was time spent personally by me on the following activities: development of treatment plan with patient and/or surrogate as well as nursing, discussions with consultants, evaluation of patient's response to treatment, examination of patient, obtaining history from patient or surrogate, ordering and performing treatments and interventions, ordering and review of laboratory studies, ordering and review of radiographic studies, pulse oximetry and re-evaluation of patient's condition.   2:04 AM-Consult complete with Dr. Tresa EndoKelly, cardiology. Patient case explained and discussed. Call ended at 2:06 AM.   MDM   Final diagnoses:  ST elevation myocardial infarction involving left anterior descending (LAD) coronary artery    Pt is a 24 y.o. female with Pmhx as above who presents with gradual onset CP arouns 10 d ago with NSVD, was intermittent, central, relieved by ibuprofen, now worse, constant since about 7pm with assoc n/v, SOB. [facility], found to have NSST changes, significant trop elevation, was given ASA and lovenox for presumed PE, [facility] EKG here now with distinct STE in infero/lateral leads, reciprocal changes. VSS. No murmurs, no signs of fluid overload, no LE/calf pain. CODE STEMI activated, pt taken to cath lab. Cardiology fellow present, suspecting spontaneous coronary dissection.      I personally performed the services described in this documentation, which was scribed in my presence. The recorded information has been reviewed and is accurate.      Toy CookeyMegan Docherty, MD 11/18/14 1105

## 2014-11-17 NOTE — Progress Notes (Signed)
eLink Physician-Brief Progress Note Patient Name: Darrelyn HillockLatrice Eilert DOB: 04/08/1991 MRN: [id number]   Date of Service  11/17/2014  HPI/Events of Note  24 yo female s/p NSVD on 5/20. Admitted to ICU with STEMI. Presume that plan to go to cath lab. H&P incomplete. Medical Rx with Lipitor., Heparin IV infusion, NTG IV infusion and Aggrastat IV infusion. Management per Cardiology.  eICU Interventions  Continue present management.     Intervention Category Evaluation Type: New Patient Evaluation  Lenell AntuSommer,Steven Eugene 11/17/2014, 4:49 AM

## 2014-11-17 NOTE — Progress Notes (Signed)
Jenny Myers reports that 75mg  of plavix was given PO in procedure.

## 2014-11-17 NOTE — Progress Notes (Signed)
Site area: RFA Site Prior to Removal:  Level  0 Pressure Applied For:25 Manual:   yes Patient Status During Pull:  stable Post Pull Site:  Level 0 Post Pull Instructions Given:   Post Pull Pulses Present: bounding Dressing Applied:  clear Bedrest begins @ 1300 Comments:

## 2014-11-17 NOTE — Progress Notes (Signed)
EKG CRITICAL VALUE     12 lead EKG performed.  Critical value noted.  Garnette CzechLexi Potter, RN notified.   Banessa Mao C, CCT 11/17/2014 7:24 AM

## 2014-11-17 NOTE — MAU Note (Signed)
[facility] that there is a critical value for Troponin 1 of 32.13. J Wenxel aware and speaking with Dr. Adrian BlackwaterStinson.

## 2014-11-17 NOTE — Progress Notes (Signed)
Patient cell phone obtained from security safe and given back to patient.Belongings sheet signed and placed in chart.  Rise PaganiniURRY, Glena Pharris R, RN

## 2014-11-17 NOTE — Progress Notes (Signed)
ANTICOAGULATION CONSULT NOTE - Initial Consult  Pharmacy Consult for Heparin/Aggrastat Indication: LAD thrombus  Allergies  Allergen Reactions  . Garlic Anaphylaxis  . Penicillins Anaphylaxis and Nausea And Vomiting  . Carrot Flavor Hives    Patient Measurements: Height: 4\' 11"  (149.9 cm) Weight: 111 lb (50.349 kg) IBW/kg (Calculated) : 43.2  Vital Signs: Temp: 98.8 F (37.1 C) (06/01 0140) Temp Source: Oral (06/01 0140) BP: 133/96 mmHg (06/01 0333) Pulse Rate: 0 (06/01 0347)  Labs:  Recent Labs  11/16/14 2300 11/17/14 0204  HGB  --  11.4*  HCT  --  36.5  PLT  --  314  CREATININE  --  0.64  TROPONINI 32.13*  --     Estimated Creatinine Clearance: 73.3 mL/min (by C-G formula based on Cr of 0.64).   Medical History: Past Medical History  Diagnosis Date  . Ectopic pregnancy 11/2010    Right salpingectomy  . Umbilical hernia   . Infection     chlamydia    Medications:  Prescriptions prior to admission  Medication Sig Dispense Refill Last Dose  . acetaminophen (TYLENOL) 325 MG tablet Take 650 mg by mouth every 6 (six) hours as needed for mild pain.    11/16/2014 at Unknown time  . Prenatal Vit-Fe Fumarate-FA (PRENATAL MULTIVITAMIN) TABS tablet Take 1 tablet by mouth daily at 12 noon.   11/16/2014 at Unknown time  . promethazine (PHENERGAN) 25 MG tablet Take 1 tablet (25 mg total) by mouth every 6 (six) hours as needed for nausea or vomiting. 20 tablet 0 11/16/2014 at Unknown time  . ranitidine (ZANTAC) 150 MG tablet Take 1 tablet (150 mg total) by mouth 2 (two) times daily. 60 tablet 1 11/16/2014 at Unknown time  . docusate sodium (COLACE) 100 MG capsule Take 1 capsule (100 mg total) by mouth 2 (two) times daily. 10 capsule 0   . ibuprofen (ADVIL,MOTRIN) 600 MG tablet Take 1 tablet (600 mg total) by mouth every 6 (six) hours. 30 tablet 0   . oxyCODONE-acetaminophen (PERCOCET/ROXICET) 5-325 MG per tablet Take 1 tablet by mouth every 4 (four) hours as needed (for pain  scale 4-7). 15 tablet 0     Assessment: 24 yo female with STEMI s/p cath, found to have multiple LAD thrombi, for heparin.  Heparin 4000 units IV bolus, 800 units/hr started in cath lab at 0345  Goal of Therapy:  Heparin level 0.3 -0.5 while on IIbIIIA inhibitor Monitor platelets by anticoagulation protocol: Yes   Plan:  Continue heparin 800 units/hr Heparin level and CBC in 6 hrs  Jessejames Steelman, Gary FleetGregory Vernon 11/17/2014,4:00 AM

## 2014-11-17 NOTE — H&P (Signed)
Patient ID: Jenny Myers MRN: [id number], DOB/AGE: [birth year]   Admit date: 11/16/2014   Primary Physician: No primary care provider on file. Primary Cardiologist: None  Pt. Profile:  63F with recent vaginal delivery (11/05/14) following an uncomplicated pregnancy presents with STEMI.  Problem List  Past Medical History  Diagnosis Date  . Ectopic pregnancy 11/2010    Right salpingectomy  . Umbilical hernia   . Infection     chlamydia    Past Surgical History  Procedure Laterality Date  . Salpingectomy  2012    left ovary, ruptured ectopic  . Wisdom tooth extraction    . Induced abortion       Allergies  Allergies  Allergen Reactions  . Garlic Anaphylaxis  . Penicillins Anaphylaxis and Nausea And Vomiting  . Carrot Flavor Hives    HPI  63F with recent vaginal delivery (11/05/14) following an uncomplicated pregnancy presents with STEMI.  Jenny Myers reports on and off CP since her delivery. Today at 7pm, it returned abruptly and was 10/10 in severity. Pain radiated to the jaw and arm. She had arm tingling. She went to [facility] where her initial ECG demonstrated inferior and lateral TWI. Troponin I returned back at 32. BNP 275. She was transferred to [facility] where her 1st ECG demonstrated inferior and lateral STE with reciprocal depressions in I and aVL. CP 10/10 on arrival to [facility].  Home Medications  Prior to Admission medications   Medication Sig Start Date End Date Taking? Authorizing Provider  acetaminophen (TYLENOL) 325 MG tablet Take 650 mg by mouth every 6 (six) hours as needed for mild pain.    Yes Historical Provider, MD  Prenatal Vit-Fe Fumarate-FA (PRENATAL MULTIVITAMIN) TABS tablet Take 1 tablet by mouth daily at 12 noon.   Yes Historical Provider, MD  promethazine (PHENERGAN) 25 MG tablet Take 1 tablet (25 mg total) by mouth every 6 (six) hours as needed for nausea or vomiting. 10/18/14  Yes Ashly M Gottschalk, DO  ranitidine (ZANTAC) 150 MG tablet  Take 1 tablet (150 mg total) by mouth 2 (two) times daily. 10/18/14  Yes Ashly Hulen Skains, DO  docusate sodium (COLACE) 100 MG capsule Take 1 capsule (100 mg total) by mouth 2 (two) times daily. 11/07/14   Smitty Cords, DO  ibuprofen (ADVIL,MOTRIN) 600 MG tablet Take 1 tablet (600 mg total) by mouth every 6 (six) hours. 11/07/14   Smitty Cords, DO  oxyCODONE-acetaminophen (PERCOCET/ROXICET) 5-325 MG per tablet Take 1 tablet by mouth every 4 (four) hours as needed (for pain scale 4-7). 11/07/14   Smitty Cords, DO    Family History  Family History  Problem Relation Age of Onset  . Hypertension Mother   . Diabetes Father   . Hypertension Father   . Cancer Father     breast  . Vision loss Paternal Grandmother   . Other Neg Hx     Social History  History   Social History  . Marital Status: Single    Spouse Name: N/A  . Number of Children: N/A  . Years of Education: N/A   Occupational History  . Not on file.   Social History Main Topics  . Smoking status: Former Games developer  . Smokeless tobacco: Never Used     Comment: quit with preg  . Alcohol Use: No     Comment: not since pregnancy  . Drug Use: No  . Sexual Activity: Yes    Birth Control/ Protection: None  Other Topics Concern  . Not on file   Social History Narrative     Review of Systems General:  No chills, fever, night sweats or weight changes.  Cardiovascular:  See HPI Dermatological: No rash, lesions/masses Respiratory: No cough, dyspnea Urologic: No hematuria, dysuria Abdominal:   No nausea, vomiting, diarrhea, bright red blood per rectum, melena, or hematemesis Neurologic:  No visual changes, wkns, changes in mental status. All other systems reviewed and are otherwise negative except as noted above.  Physical Exam  Blood pressure 157/109, pulse 80, temperature 98.8 F (37.1 C), temperature source Oral, resp. rate 14, height 4\' 11"  (1.499 m), weight 50.349 kg (111 lb), SpO2 99  %, unknown if currently breastfeeding.  General: Pleasant, in obvious distress Psych: Normal affect. Neuro: Alert and oriented X 3. Moves all extremities spontaneously. HEENT: Normal  Neck: Supple without bruits or JVD. Lungs:  Resp regular and unlabored, CTA. Heart: RRR no s3, s4, or murmurs. Abdomen: Soft, non-tender, non-distended, BS + x 4.  Extremities: No clubbing, cyanosis or edema. DP/PT/Radials 2+ and equal bilaterally.  Labs  Troponin (Point of Care Test) No results for input(s): TROPIPOC in the last 72 hours.  Recent Labs  11/16/14 2300  TROPONINI 32.13*   Lab Results  Component Value Date   WBC 12.1* 11/06/2014   HGB 10.8* 11/06/2014   HCT 32.9* 11/06/2014   MCV 86.4 11/06/2014   PLT 245 11/06/2014   No results for input(s): NA, K, CL, CO2, BUN, CREATININE, CALCIUM, PROT, BILITOT, ALKPHOS, ALT, AST, GLUCOSE in the last 168 hours.  Invalid input(s): LABALBU No results found for: CHOL, HDL, LDLCALC, TRIG No results found for: DDIMER   Radiology/Studies  Dg Chest 2 View  11/16/2014   CLINICAL DATA:  Acute onset of anterior chest pain. Initial encounter.  EXAM: CHEST  2 VIEW  COMPARISON:  Chest radiograph performed 04/21/2013  FINDINGS: The lungs are well-aerated and clear. There is no evidence of focal opacification, pleural effusion or pneumothorax.  The heart is normal in size; the mediastinal contour is within normal limits. No acute osseous abnormalities are seen.  IMPRESSION: No acute cardiopulmonary process seen.   Electronically Signed   By: Roanna RaiderJeffery  Chang M.D.   On: 11/16/2014 23:57    ECG  NSR. Inferior and lateral STE with reciprocal depressions in I and aVL  ASSESSMENT AND PLAN  76F with recent vaginal delivery (11/05/14) following an uncomplicated pregnancy presents with STEMI. Clinical history is most consistent with Type I MI rather than PE or aortic dissection with RCA involvement. Case discussed with Dr. Tresa EndoKelly, on call interventional cardiologist.  Will plan for emergent angiography.   Signed, Glori LuisFRIEDMAN, Modesty Rudy, MD 11/17/2014, 2:30 AM

## 2014-11-17 NOTE — H&P (View-Only) (Signed)
Patient Name: Jenny HillockLatrice Myers Date of Encounter: 11/17/2014  Active Problems:   Chest pain   ST elevation myocardial infarction involving left anterior descending (LAD) coronary artery   Acute coronary syndrome   STEMI (ST elevation myocardial infarction)   Length of Stay: 0  SUBJECTIVE  The patient is crying from pain, retrosternal, severe 10/10 with SOB.   CURRENT MEDS . [START ON 11/18/2014] aspirin EC  81 mg Oral Daily  . atorvastatin  80 mg Oral q1800  . fentaNYL      . metoprolol tartrate  12.5 mg Oral BID  . sodium chloride  3 mL Intravenous Q12H   OBJECTIVE  Filed Vitals:   11/17/14 0500 11/17/14 0600 11/17/14 0615 11/17/14 0730  BP: 123/57 114/75  133/84  Pulse: 94 79 93 99  Temp:    97.9 F (36.6 C)  TempSrc:    Oral  Resp: 14 15 24 17   Height:      Weight:      SpO2: 94% 96% 100% 99%    Intake/Output Summary (Last 24 hours) at 11/17/14 0850 Last data filed at 11/17/14 0700  Gross per 24 hour  Intake 409.83 ml  Output    275 ml  Net 134.83 ml   Filed Weights   11/16/14 2142 11/17/14 0400  Weight: 111 lb (50.349 kg) 111 lb 15.9 oz (50.8 kg)    PHYSICAL EXAM  General: Pleasant, NAD. Neuro: Alert and oriented X 3. Moves all extremities spontaneously. Psych: Normal affect. HEENT:  Normal  Neck: Supple without bruits or JVD. Lungs:  Resp regular and unlabored, CTA. Heart: RRR no s3, s4, or murmurs. Abdomen: Soft, non-tender, non-distended, BS + x 4.  Extremities: No clubbing, cyanosis or edema. DP/PT/Radials 2+ and equal bilaterally.  Accessory Clinical Findings  CBC  Recent Labs  11/17/14 0204 11/17/14 0431 11/17/14 0522  WBC 7.7 8.5 7.7  NEUTROABS 5.6  --   --   HGB 11.4* 11.2* 11.1*  HCT 36.5 35.4* 35.1*  MCV 89.0 87.8 87.5  PLT 314 342 343   Basic Metabolic Panel  Recent Labs  11/17/14 0431 11/17/14 0522  NA 138 139  K 3.7 3.6  CL 109 110  CO2 18* 20*  GLUCOSE 88 94  BUN 7 7  CREATININE 0.57 0.60  CALCIUM 8.2* 8.3*     Recent Labs  11/16/14 2300 11/17/14 0522  TROPONINI 32.13* >65.00*    Recent Labs  11/17/14 0522  CHOL 264*  HDL 44  LDLCALC 191*  TRIG 143  CHOLHDL 6.0   Radiology/Studies  Dg Chest 2 View  11/16/2014   CLINICAL DATA:  Acute onset of anterior chest pain. Initial encounter.  EXAM: CHEST  2 VIEW  COMPARISON:  Chest radiograph performed 04/21/2013  FINDINGS: The lungs are well-aerated and clear. There is no evidence of focal opacification, pleural effusion or pneumothorax.  The heart is normal in size; the mediastinal contour is within normal limits. No acute osseous abnormalities are seen.  IMPRESSION: No acute cardiopulmonary process seen.   Electronically Signed   By: Roanna RaiderJeffery  Chang M.D.   On: 11/16/2014 23:57   TELE: SR  ECG: SR, inferior STE, resolved anterior changes   ASSESSMENT AND PLAN  24- year-old female 10 day post uncomplicated vaginal delivery presented with chest pain that started at 7 pm the last night and was found to have inferior and anterior STE and initial troponin 32-> 65. 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 The images were reviewed and discussed in details with Dr Tresa EndoKelly, Dr Clifton JamesMcAlhany and Dr Riley KillStuckey. It was decided to discontinue Aggrastat and heparin drip and bring her back to the cath lab for IVUS and re-imaging. Also there is presumption that contrast injection might help to open the vessel. Dr Laneta SimmersBartle was notified of the case, however the surgical targets are  poor.  For now we will continue NTG drip, morphine for pain control, aspirin, atorvastatin and low dose metoprolol.  Echocardiogram is pending.  Signed, Lars MassonNELSON, Akram Kissick H MD, [facility] 11/17/2014

## 2014-11-17 NOTE — Interval H&P Note (Signed)
History and Physical Interval Note:  11/17/2014 11:19 AM  Jenny Myers  has presented today for surgery, with the diagnosis of relook  The various methods of treatment have been discussed with the patient and family. After consideration of risks, benefits and other options for treatment, the patient has consented to  Procedure(s): Left Heart Cath and Coronary Angiography (N/A) as a surgical intervention .  The patient's history has been reviewed, patient examined, no change in status, stable for surgery.  I have reviewed the patient's chart and labs.  Questions were answered to the patient's satisfaction.     KELLY,THOMAS A

## 2014-11-17 NOTE — Progress Notes (Signed)
  Echocardiogram 2D Echocardiogram has been performed.  Jenny Myers, Jenny Myers 11/17/2014, 10:04 AM

## 2014-11-17 NOTE — MAU Note (Signed)
Report given to Antionette Fair[facility]Woody [facility].

## 2014-11-18 ENCOUNTER — Encounter (HOSPITAL_COMMUNITY): Payer: Self-pay | Admitting: Cardiovascular Disease

## 2014-11-18 DIAGNOSIS — I2542 Coronary artery dissection: Secondary | ICD-10-CM

## 2014-11-18 DIAGNOSIS — I10 Essential (primary) hypertension: Secondary | ICD-10-CM

## 2014-11-18 DIAGNOSIS — E785 Hyperlipidemia, unspecified: Secondary | ICD-10-CM

## 2014-11-18 LAB — CBC
HCT: 38.3 % (ref 36.0–46.0)
Hemoglobin: 12.3 g/dL (ref 12.0–15.0)
MCH: 28 pg (ref 26.0–34.0)
MCHC: 32.1 g/dL (ref 30.0–36.0)
MCV: 87.2 fL (ref 78.0–100.0)
PLATELETS: 338 10*3/uL (ref 150–400)
RBC: 4.39 MIL/uL (ref 3.87–5.11)
RDW: 15.2 % (ref 11.5–15.5)
WBC: 8.3 10*3/uL (ref 4.0–10.5)

## 2014-11-18 LAB — BASIC METABOLIC PANEL
Anion gap: 10 (ref 5–15)
BUN: <5 mg/dL — ABNORMAL LOW (ref 6–20)
CHLORIDE: 102 mmol/L (ref 101–111)
CO2: 24 mmol/L (ref 22–32)
CREATININE: 0.56 mg/dL (ref 0.44–1.00)
Calcium: 9.1 mg/dL (ref 8.9–10.3)
GFR calc non Af Amer: >60 mL/min (ref >60)
Glucose, Bld: 82 mg/dL (ref 65–99)
Potassium: 3.6 mmol/L (ref 3.5–5.1)
Sodium: 136 mmol/L (ref 135–145)

## 2014-11-18 MED ORDER — METOPROLOL TARTRATE 25 MG PO TABS
25.0000 mg | ORAL_TABLET | Freq: Two times a day (BID) | ORAL | Status: DC
  Administered 2014-11-18 – 2014-11-19 (×3): 25 mg via ORAL
  Filled 2014-11-18 (×3): qty 1

## 2014-11-18 MED ORDER — METOPROLOL TARTRATE 1 MG/ML IV SOLN
INTRAVENOUS | Status: AC
  Administered 2014-11-18: 5 mg
  Filled 2014-11-18: qty 5

## 2014-11-18 MED FILL — Heparin Sodium (Porcine) 2 Unit/ML in Sodium Chloride 0.9%: INTRAMUSCULAR | Qty: 1000 | Status: AC

## 2014-11-18 MED FILL — Aspirin Chew Tab 81 MG: ORAL | Qty: 4 | Status: AC

## 2014-11-18 MED FILL — Heparin Sodium (Porcine) 2 Unit/ML in Sodium Chloride 0.9%: INTRAMUSCULAR | Qty: 1500 | Status: AC

## 2014-11-18 NOTE — Progress Notes (Signed)
EKG CRITICAL VALUE     12 lead EKG performed.  Critical value noted.  Garnette CzechLexi Potter, RN notified.   Auron Tadros H, CCT 11/18/2014 7:04 AM

## 2014-11-18 NOTE — Progress Notes (Signed)
Notified MD regarding patiemts continues chest pain 10/10. PO and IV pain meds given. Ntg restarted. Metoprolol 5mg  IV given. EKG done. Pain now 9/10. MD aware pressure radiating up shoulder. Will continue to monitor and assess pt.

## 2014-11-18 NOTE — Progress Notes (Signed)
Patient Name: Jenny Myers Date of Encounter: 11/18/2014  Active Problems:   Chest pain   ST elevation myocardial infarction involving left anterior descending (LAD) coronary artery   Acute coronary syndrome   STEMI (ST elevation myocardial infarction)   Length of Stay: 1  SUBJECTIVE  There is minimal chest pain, no SOB but significant headache..   CURRENT MEDS . aspirin EC  81 mg Oral Daily  . atorvastatin  80 mg Oral q1800  . clopidogrel  75 mg Oral Q breakfast  . metoprolol tartrate  25 mg Oral BID  . sodium chloride  3 mL Intravenous Q12H  . sodium chloride  3 mL Intravenous Q12H   OBJECTIVE  Filed Vitals:   11/18/14 0600 11/18/14 0700 11/18/14 0800 11/18/14 1000  BP: 141/89 138/89 150/104 126/74  Pulse:      Temp:   98.8 F (37.1 C)   TempSrc:   Oral   Resp: 13 14 13 19   Height:      Weight:      SpO2:   100%     Intake/Output Summary (Last 24 hours) at 11/18/14 1107 Last data filed at 11/18/14 0900  Gross per 24 hour  Intake 937.09 ml  Output   2825 ml  Net -1887.91 ml   Filed Weights   11/16/14 2142 11/17/14 0400 11/17/14 0800  Weight: 111 lb (50.349 kg) 111 lb 15.9 oz (50.8 kg) 110 lb 3.7 oz (50 kg)    PHYSICAL EXAM  General: Pleasant, NAD. Neuro: Alert and oriented X 3. Moves all extremities spontaneously. Psych: Normal affect. HEENT:  Normal  Neck: Supple without bruits or JVD. Lungs:  Resp regular and unlabored, CTA. Heart: RRR no s3, s4, or murmurs. Abdomen: Soft, non-tender, non-distended, BS + x 4.  Extremities: No clubbing, cyanosis or edema. DP/PT/Radials 2+ and equal bilaterally.  Accessory Clinical Findings  CBC  Recent Labs  11/17/14 0204  11/17/14 0522 11/18/14 0256  WBC 7.7  < > 7.7 8.3  NEUTROABS 5.6  --   --   --   HGB 11.4*  < > 11.1* 12.3  HCT 36.5  < > 35.1* 38.3  MCV 89.0  < > 87.5 87.2  PLT 314  < > 343 338  < > = values in this interval not displayed. Basic Metabolic Panel  Recent Labs  11/17/14 0522  11/18/14 0256  NA 139 136  K 3.6 3.6  CL 110 102  CO2 20* 24  GLUCOSE 94 82  BUN 7 <5*  CREATININE 0.60 0.56  CALCIUM 8.3* 9.1     Recent Labs  11/17/14 0522 11/17/14 1040 11/17/14 1635  TROPONINI >65.00* >65.00* 37.03*    Recent Labs  11/17/14 0522  CHOL 264*  HDL 44  LDLCALC 191*  TRIG 143  CHOLHDL 6.0   Radiology/Studies  Dg Chest 2 View  11/16/2014   CLINICAL DATA:  Acute onset of anterior chest pain. Initial encounter.  EXAM: CHEST  2 VIEW  COMPARISON:  Chest radiograph performed 04/21/2013  FINDINGS: The lungs are well-aerated and clear. There is no evidence of focal opacification, pleural effusion or pneumothorax.  The heart is normal in size; the mediastinal contour is within normal limits. No acute osseous abnormalities are seen.  IMPRESSION: No acute cardiopulmonary process seen.   Electronically Signed   By: Roanna RaiderJeffery  Chang M.D.   On: 11/16/2014 23:57   TELE: SR  ECG: SR, inferior STE, resolved anterior changes    ASSESSMENT AND PLAN  24- year-old female  10 day post uncomplicated vaginal delivery presented with chest pain that started at 7 pm the last night and was found to have inferior and anterior STE and initial troponin 32-> 65. 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 The images were reviewed and discussed in details with Dr Tresa Endo, Dr Clifton James and Dr Riley Kill. It was decided to discontinue Aggrastat and heparin drip. Dr Laneta Simmers was notified of the case, however the surgical targets are poor.  Troponin is downtrending, peaked yesterday at > 65. Echo shows persistent STE in the  inferior leads. LVEF 55%, no regional wall motion abnormalities. We will try to wean off nitro drip, increase metoprolol to 25 mg po BID. Dilaudid/morhine for pain.  Continue aspirin, atorvastatin and metoprolol. She wants to continue breastfeeding (currently pumping), we will avoid ACEI, if further BP control necessary or decreased LVEF (we will repeat echo tomorrow) we will start imdur/hydralazine combination.   Signed, Jenny Masson MD, [facility] 11/18/2014

## 2014-11-18 NOTE — Progress Notes (Signed)
1340 Talked with pt's RN. Will continue to follow and await ambulation order when pt stable to walk. Luetta NuttingCharlene Jaonna Word RN BSN 11/18/2014 1:41 PM

## 2014-11-19 ENCOUNTER — Ambulatory Visit (HOSPITAL_COMMUNITY): Payer: Medicaid Other

## 2014-11-19 LAB — CBC
HEMATOCRIT: 37 % (ref 36.0–46.0)
HEMOGLOBIN: 11.7 g/dL — AB (ref 12.0–15.0)
MCH: 27.5 pg (ref 26.0–34.0)
MCHC: 31.6 g/dL (ref 30.0–36.0)
MCV: 87.1 fL (ref 78.0–100.0)
Platelets: 329 10*3/uL (ref 150–400)
RBC: 4.25 MIL/uL (ref 3.87–5.11)
RDW: 15.6 % — ABNORMAL HIGH (ref 11.5–15.5)
WBC: 7.6 10*3/uL (ref 4.0–10.5)

## 2014-11-19 MED ORDER — DOCUSATE SODIUM 100 MG PO CAPS
100.0000 mg | ORAL_CAPSULE | Freq: Two times a day (BID) | ORAL | Status: DC | PRN
  Administered 2014-11-20: 100 mg via ORAL
  Filled 2014-11-19 (×3): qty 1

## 2014-11-19 MED ORDER — METOPROLOL TARTRATE 12.5 MG HALF TABLET
12.5000 mg | ORAL_TABLET | Freq: Once | ORAL | Status: AC
  Administered 2014-11-19: 12.5 mg via ORAL
  Filled 2014-11-19: qty 1

## 2014-11-19 MED ORDER — METOPROLOL TARTRATE 25 MG PO TABS
37.5000 mg | ORAL_TABLET | Freq: Two times a day (BID) | ORAL | Status: DC
  Administered 2014-11-19 – 2014-11-22 (×6): 37.5 mg via ORAL
  Filled 2014-11-19 (×9): qty 1

## 2014-11-19 NOTE — Progress Notes (Addendum)
Patient Name: Jenny Myers Date of Encounter: 11/19/2014  Active Problems:   Chest pain   ST elevation myocardial infarction involving left anterior descending (LAD) coronary artery   Acute coronary syndrome   STEMI (ST elevation myocardial infarction)   Length of Stay: 2  SUBJECTIVE  There is minimal chest pain 4/10, the patient developed severe chest pain the last night, that improved with restarting nitro drip, metoprolol, dilauded. No SOB.    CURRENT MEDS . aspirin EC  81 mg Oral Daily  . atorvastatin  80 mg Oral q1800  . clopidogrel  75 mg Oral Q breakfast  . metoprolol tartrate  25 mg Oral BID  . sodium chloride  3 mL Intravenous Q12H  . sodium chloride  3 mL Intravenous Q12H   OBJECTIVE  Filed Vitals:   11/19/14 0734 11/19/14 0800 11/19/14 0900 11/19/14 1000  BP: 108/28 141/65 135/78 142/91  Pulse:      Temp: 98.5 F (36.9 C)     TempSrc: Oral     Resp: Height:      Weight:      SpO2: 96%       Intake/Output Summary (Last 24 hours) at 11/19/14 1053 Last data filed at 11/19/14 1000  Gross per 24 hour  Intake    917 ml  Output   2000 ml  Net  -1083 ml   Filed Weights   11/16/14 2142 11/17/14 0400 11/17/14 0800  Weight: 111 lb (50.349 kg) 111 lb 15.9 oz (50.8 kg) 110 lb 3.7 oz (50 kg)    PHYSICAL EXAM  General: Pleasant, NAD. Neuro: Alert and oriented X 3. Moves all extremities spontaneously. Psych: Normal affect. HEENT:  Normal  Neck: Supple without bruits or JVD. Lungs:  Resp regular and unlabored, CTA. Heart: RRR no s3, s4, or murmurs. Abdomen: Soft, non-tender, non-distended, BS + x 4.  Extremities: No clubbing, cyanosis or edema. DP/PT/Radials 2+ and equal bilaterally.  Accessory Clinical Findings  CBC  Recent Labs  11/17/14 0204  11/18/14 0256 11/19/14 0300  WBC 7.7  < > 8.3 7.6  NEUTROABS 5.6  --   --   --   HGB 11.4*  < > 12.3 11.7*  HCT 36.5  < > 38.3 37.0  MCV 89.0  < > 87.2 87.1  PLT 314  < > 338 329  < > =  values in this interval not displayed. Basic Metabolic Panel  Recent Labs  11/17/14 0522 11/18/14 0256  NA 139 136  K 3.6 3.6  CL 110 102  CO2 20* 24  GLUCOSE 94 82  BUN 7 <5*  CREATININE 0.60 0.56  CALCIUM 8.3* 9.1     Recent Labs  11/17/14 0522 11/17/14 1040 11/17/14 1635  TROPONINI >65.00* >65.00* 37.03*    Recent Labs  11/17/14 0522  CHOL 264*  HDL 44  LDLCALC 191*  TRIG 143  CHOLHDL 6.0   Radiology/Studies  Dg Chest 2 View  11/16/2014   CLINICAL DATA:  Acute onset of anterior chest pain. Initial encounter.  EXAM: CHEST  2 VIEW  COMPARISON:  Chest radiograph performed 04/21/2013  FINDINGS: The lungs are well-aerated and clear. There is no evidence of focal opacification, pleural effusion or pneumothorax.  The heart is normal in size; the mediastinal contour is within normal limits. No acute osseous abnormalities are seen.  IMPRESSION: No acute cardiopulmonary process seen.   Electronically Signed   By: Roanna Raider M.D.   On: 11/16/2014 23:57  TELE: SR  ECG: SR, inferior STE, resolved anterior changes    ASSESSMENT AND PLAN  24- year-old female 10 day post uncomplicated vaginal delivery presented with chest pain that started at 7 pm the last night and was found to have inferior and anterior STE and initial troponin 32-> 65->37. 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 The images were reviewed and discussed in details with Dr Tresa EndoKelly, Dr Clifton JamesMcAlhany and Dr Riley KillStuckey. It was decided to discontinue Aggrastat and heparin drip. Dr Laneta SimmersBartle was notified of the case, however the surgical targets are  poor.  Troponin is downtrending, peaked yesterday at > 65. Echo shows persistent STE in the inferior leads. LVEF 55%, no regional wall motion abnormalities. We will repeat today. She felt better yesterday then had recurrent severe chest pain in the afternoon, now better controlled. We will try to wean off nitro drip, increase metoprolol to 37.5 mg po BID. Dilaudid/morhine for pain.  Continue aspirin, atorvastatin and metoprolol. She wants to continue breastfeeding (currently pumping), we will avoid ACEI, if further BP control necessary or decreased LVEF (we will repeat echo tomorrow) we will start imdur/hydralazine combination.   Signed, Lars MassonNELSON, Shelisha Gautier H MD, [facility] 11/19/2014

## 2014-11-20 ENCOUNTER — Inpatient Hospital Stay (HOSPITAL_COMMUNITY): Payer: Medicaid Other

## 2014-11-20 DIAGNOSIS — I213 ST elevation (STEMI) myocardial infarction of unspecified site: Secondary | ICD-10-CM

## 2014-11-20 LAB — CBC
HEMATOCRIT: 37.9 % (ref 36.0–46.0)
Hemoglobin: 12 g/dL (ref 12.0–15.0)
MCH: 27.5 pg (ref 26.0–34.0)
MCHC: 31.7 g/dL (ref 30.0–36.0)
MCV: 86.9 fL (ref 78.0–100.0)
Platelets: 334 10*3/uL (ref 150–400)
RBC: 4.36 MIL/uL (ref 3.87–5.11)
RDW: 15.5 % (ref 11.5–15.5)
WBC: 5.4 10*3/uL (ref 4.0–10.5)

## 2014-11-20 MED ORDER — ISOSORBIDE MONONITRATE ER 30 MG PO TB24
30.0000 mg | ORAL_TABLET | Freq: Every day | ORAL | Status: DC
  Administered 2014-11-20 – 2014-11-22 (×3): 30 mg via ORAL
  Filled 2014-11-20 (×3): qty 1

## 2014-11-20 MED ORDER — DIPHENHYDRAMINE HCL 50 MG/ML IJ SOLN
25.0000 mg | Freq: Once | INTRAMUSCULAR | Status: AC
  Administered 2014-11-20: 25 mg via INTRAVENOUS
  Filled 2014-11-20: qty 1

## 2014-11-20 NOTE — Progress Notes (Signed)
Subjective:  Chest pain is 1/10. NTG 15 gtt. IV dilaudid, percocet.   Objective:  Vital Signs in the last 24 hours: Temp:  [98.4 F (36.9 C)-99.9 F (37.7 C)] 98.8 F (37.1 C) (06/04 0900) Pulse Rate:  [95] 95 (06/04 0900) Resp:  [12-23] 17 (06/04 0900) BP: (95-150)/(33-107) 127/83 mmHg (06/04 0900) SpO2:  [94 %-98 %] 95 % (06/04 0900) Weight:  [109 lb 2 oz (49.5 kg)] 109 lb 2 oz (49.5 kg) (06/04 0344)  Intake/Output from previous day: 06/03 0701 - 06/04 0700 In: 844.3 [P.O.:480; I.V.:364.3] Out: 1501 [Urine:1501]   Physical Exam: General: Well developed, well nourished, in no acute distress. Head:  Normocephalic and atraumatic. Lungs: Clear to auscultation and percussion. Heart: Normal S1 and S2.  No murmur, rubs or gallops.  Abdomen: soft, non-tender, positive bowel sounds. Extremities: No clubbing or cyanosis. No edema. Neurologic: Alert and oriented x 3.    Lab Results:  Recent Labs  11/19/14 0300 11/20/14 0250  WBC 7.6 5.4  HGB 11.7* 12.0  PLT 329 334    Recent Labs  11/18/14 0256  NA 136  K 3.6  CL 102  CO2 24  GLUCOSE 82  BUN <5*  CREATININE 0.56    Recent Labs  11/17/14 1040 11/17/14 1635  TROPONINI >65.00* 37.03*     Telemetry: No VT Personally viewed.   EKG:  Inf STEMI changes  Cardiac Studies:  ECHO 6/1 - normal EF Cath: x 2 on 6/1  LAD SCAD   Mid LAD to Dist LAD lesion, 70% stenosed.  Dist LAD lesion, 100% stenosed.  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 The distal LAD remains small caliber and was was previously noted, but the most apical portion of the LAD now appears to be completely  occluded.  Recommendation:  The patient will be started on aspirin and Plavix. Angiograms were reviewed with multiple colleagues. Medical management approach is recommended..  Scheduled Meds: . aspirin EC  81 mg Oral Daily  . atorvastatin  80 mg Oral q1800  . clopidogrel  75 mg Oral Q breakfast  . metoprolol tartrate  37.5 mg Oral BID  . sodium chloride  3 mL Intravenous Q12H  . sodium chloride  3 mL Intravenous Q12H   Continuous Infusions: . sodium chloride 999 mL/hr at 11/17/14 0140  . nitroGLYCERIN 15 mcg/min (11/20/14 0330)   PRN Meds:.sodium chloride, sodium chloride, acetaminophen, docusate sodium, fentaNYL (SUBLIMAZE) injection, HYDROmorphone (DILAUDID) injection, morphine injection, nitroGLYCERIN, ondansetron (ZOFRAN) IV, oxyCODONE-acetaminophen, sodium chloride, sodium chloride  Assessment/Plan:  Active Problems:   Chest pain   ST elevation myocardial infarction involving left anterior descending (LAD) coronary artery   Acute coronary syndrome   STEMI (ST elevation myocardial infarction)  24- year-old female 10 day post uncomplicated vaginal delivery presented with chest pain that started at 7 pm the last night and was found to have inferior and anterior STE and initial troponin 32-> 65.   STEMI  - LAD SCAD. 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 The images were reviewed and discussed in details with Dr Tresa Endo, Dr Clifton James and Dr Riley Kill. Dr Laneta Simmers was notified of the case, however the surgical targets are  poor.  - ASA plavix (no heparin to avoid intraluminal hematoma and propagation)  - Multiple colleagues reviewed  - EF 55% on 6/1. Repeating echo.   - Will place on Imdur 30mg  PO and try to wean off NTG IV today. Discussed with nursing.   - No bowel movement - colace, may need miralax or senna. On opiates.   - No ACE-I because of breast feeding.    - atorvastatin, metoprolol.   - She wants to continue breastfeeding (currently pumping), we will avoid ACEI  - Discussed with her severity and gravity of situation.   Plan for tele bed tomorrow    Donato SchultzSKAINS, Trina Asch 11/20/2014, 9:24 AM

## 2014-11-20 NOTE — Progress Notes (Signed)
  Echocardiogram 2D Echocardiogram has been performed.  Delcie RochENNINGTON, Donaldson Richter 11/20/2014, 4:53 PM

## 2014-11-21 LAB — CBC
HCT: 38.3 % (ref 36.0–46.0)
Hemoglobin: 12.5 g/dL (ref 12.0–15.0)
MCH: 28.4 pg (ref 26.0–34.0)
MCHC: 32.6 g/dL (ref 30.0–36.0)
MCV: 87 fL (ref 78.0–100.0)
PLATELETS: 322 10*3/uL (ref 150–400)
RBC: 4.4 MIL/uL (ref 3.87–5.11)
RDW: 15.5 % (ref 11.5–15.5)
WBC: 4.6 10*3/uL (ref 4.0–10.5)

## 2014-11-21 MED ORDER — DIPHENHYDRAMINE HCL 50 MG/ML IJ SOLN
50.0000 mg | Freq: Once | INTRAMUSCULAR | Status: AC
  Administered 2014-11-21: 50 mg via INTRAVENOUS

## 2014-11-21 MED ORDER — DIPHENHYDRAMINE HCL 50 MG/ML IJ SOLN
INTRAMUSCULAR | Status: AC
  Filled 2014-11-21: qty 1

## 2014-11-21 MED ORDER — DIPHENHYDRAMINE HCL 50 MG/ML IJ SOLN
50.0000 mg | Freq: Once | INTRAMUSCULAR | Status: AC
  Administered 2014-11-21: 50 mg via INTRAVENOUS
  Filled 2014-11-21: qty 1

## 2014-11-21 NOTE — Progress Notes (Signed)
Report called to Mickie Bailosha, RN at this time.

## 2014-11-21 NOTE — Progress Notes (Signed)
Patient reports c/o of itching to upper body and back at 2130. Orders received from Dr. Sullivan LoneGilbert , Benadryl given. Hives noted to patient's upper arms , back and chest. Lavender lotion applied per patient request to areas. Patient reports that this occurs when she has eaten something with garlic. Her dinner was a pasta dish. No respiratory distress noted. VVS. Will continue to monitor.

## 2014-11-21 NOTE — Progress Notes (Signed)
Pt arrived to the unit via wheelchair. C/o HA 8/10 prn given. No other concerns at this time.

## 2014-11-21 NOTE — Progress Notes (Signed)
Pt eating chicken from kitchen having a reaction to possible seasoning.

## 2014-11-21 NOTE — Progress Notes (Signed)
Subjective:  Chest pain is gone. Feeling better.   Objective:  Vital Signs in the last 24 hours: Temp:  [97.8 F (36.6 C)-98.3 F (36.8 C)] 98.2 F (36.8 C) (06/05 0808) Pulse Rate:  [60-102] 67 (06/05 0808) Resp:  [11-18] 12 (06/05 0808) BP: (93-142)/(55-97) 110/68 mmHg (06/05 0808) SpO2:  [96 %-98 %] 96 % (06/05 0808)  Intake/Output from previous day: 06/04 0701 - 06/05 0700 In: 567 [P.O.:480; I.V.:87] Out: 901 [Urine:900; Stool:1]   Physical Exam: General: Well developed, well nourished, in no acute distress. Head:  Normocephalic and atraumatic. Lungs: Clear to auscultation and percussion. Heart: Normal S1 and S2.  No murmur, rubs or gallops.  Abdomen: soft, non-tender, positive bowel sounds. Extremities: No clubbing or cyanosis. No edema. Neurologic: Alert and oriented x 3.    Lab Results:  Recent Labs  11/20/14 0250 11/21/14 0353  WBC 5.4 4.6  HGB 12.0 12.5  PLT 334 322   No results for input(s): NA, K, CL, CO2, GLUCOSE, BUN, CREATININE in the last 72 hours. No results for input(s): TROPONINI in the last 72 hours.  Invalid input(s): CK, MB   Telemetry: No VT Personally viewed.   EKG:  Inf STEMI changes  Cardiac Studies:  ECHO 6/1 - normal EF Cath: x 2 on 6/1  LAD SCAD   Mid LAD to Dist LAD lesion, 70% stenosed.  Dist LAD lesion, 100% stenosed.  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 The distal LAD remains small caliber and was was previously noted, but the most apical portion of the LAD now appears to be completely occluded.  Recommendation:  The patient will be started on aspirin and Plavix. Angiograms were reviewed with multiple  colleagues. Medical management approach is recommended..  Scheduled Meds: . aspirin EC  81 mg Oral Daily  . atorvastatin  80 mg Oral q1800  . clopidogrel  75 mg Oral Q breakfast  . isosorbide mononitrate  30 mg Oral Daily  . metoprolol tartrate  37.5 mg Oral BID  . sodium chloride  3 mL Intravenous Q12H   Continuous Infusions: . sodium chloride 999 mL/hr at 11/17/14 0140  . nitroGLYCERIN 15 mcg/min (11/20/14 1300)   PRN Meds:.sodium chloride, acetaminophen, docusate sodium, HYDROmorphone (DILAUDID) injection, morphine injection, nitroGLYCERIN, ondansetron (ZOFRAN) IV, oxyCODONE-acetaminophen, sodium chloride  Assessment/Plan:  Active Problems:   Chest pain   ST elevation myocardial infarction involving left anterior descending (LAD) coronary artery   Acute coronary syndrome   STEMI (ST elevation myocardial infarction)  24- year-old female 10 day post uncomplicated vaginal delivery presented with chest pain that started at 7 pm the last night and was found to have inferior and anterior STE and initial troponin 32-> 65.   STEMI  - LAD SCAD. 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 The images were reviewed and discussed in details with Dr Tresa Endo, Dr Clifton James and Dr Riley Kill. Dr Laneta Simmers was notified of the case, however the surgical targets are poor.  - ASA plavix (no heparin to avoid intraluminal hematoma and propagation)  - Multiple colleagues reviewed  - EF 55% on 6/1. Repeat ECHO with EF 45-50% inferobasal hypokinesis.    - Tolerating Imdur 30,  off NTG IV   - No bowel movement - colace, may need miralax or senna. On opiates.   - No ACE-I because of breast feeding.   - atorvastatin, metoprolol.   - She wants to continue breastfeeding (currently pumping), we will avoid ACEI     Transfer to TELE Cardiac rehab [facility] tomorrow.    Eulogia Dismore 11/21/2014,  9:34 AM

## 2014-11-22 ENCOUNTER — Other Ambulatory Visit: Payer: Self-pay

## 2014-11-22 LAB — CBC
HEMATOCRIT: 36.8 % (ref 36.0–46.0)
HEMOGLOBIN: 12 g/dL (ref 12.0–15.0)
MCH: 28.2 pg (ref 26.0–34.0)
MCHC: 32.6 g/dL (ref 30.0–36.0)
MCV: 86.4 fL (ref 78.0–100.0)
Platelets: 341 10*3/uL (ref 150–400)
RBC: 4.26 MIL/uL (ref 3.87–5.11)
RDW: 15.7 % — ABNORMAL HIGH (ref 11.5–15.5)
WBC: 4.1 10*3/uL (ref 4.0–10.5)

## 2014-11-22 MED ORDER — NITROGLYCERIN 0.4 MG SL SUBL: 0.4000 mg | SUBLINGUAL_TABLET | SUBLINGUAL | Status: DC | PRN

## 2014-11-22 MED ORDER — METOPROLOL TARTRATE 37.5 MG PO TABS: 37.5000 mg | ORAL_TABLET | Freq: Two times a day (BID) | ORAL | Status: DC

## 2014-11-22 MED ORDER — ISOSORBIDE MONONITRATE ER 30 MG PO TB24: 30.0000 mg | ORAL_TABLET | Freq: Every day | ORAL | Status: DC

## 2014-11-22 MED ORDER — ASPIRIN 81 MG PO TBEC: 81.0000 mg | DELAYED_RELEASE_TABLET | Freq: Every day | ORAL | Status: AC

## 2014-11-22 MED ORDER — CLOPIDOGREL BISULFATE 75 MG PO TABS: 75.0000 mg | ORAL_TABLET | Freq: Every day | ORAL | Status: DC

## 2014-11-22 NOTE — Progress Notes (Signed)
CARDIAC REHAB PHASE I   PRE:  Rate/Rhythm: 86 sR  BP:  Sitting: 122/64        SaO2: 98 RA  MODE:  Ambulation: 920 ft  POST:  Rate/Rhythm: 85 SR  BP:  Sitting: 127/67        SaO2: 99 RA  Pt ambulated 920 ft on RA, independent, steady gait, tolerated well.  Pt denies cp, dizziness, declined rest stop. Pt c/o mild fatigue upon returning to room. Completed MI education.  Reviewed anti-platelet therapy, tobacco cessation, risk factors, activity restrictions, ntg, exercise, heart healthy diet, daily weights, sodium restrictions, phase 2 cardiac rehab. Pt verbalized understanding. Pt declines phase 2 cardiac rehab at this time, states she "doesn't have time." Pt with somewhat flat affect, quiet, but still seems receptive to education. Pt to recliner after walk, call bell within reach.   [id number]  Joylene GrapesMonge, Arika Mainer C, RN, BSN 11/22/2014 9:09 AM

## 2014-11-22 NOTE — Discharge Summary (Signed)
Physician Discharge Summary    Cardiologist:  Nelson-New  Patient ID: Jenny Myers MRN: [id number] DOB/AGE: 26/02/1991 23 y.o.  Admit date: 11/16/2014 Discharge date: 11/22/2014  Admission Diagnoses:  STEMI  Discharge Diagnoses:  Active Problems:   Chest pain   ST elevation myocardial infarction involving left anterior descending (LAD) coronary artery   Acute coronary syndrome   Discharged Condition: stable  Hospital Course:   25F with recent vaginal delivery (11/05/14) following an uncomplicated pregnancy presents with STEMI.  Jenny Myers reported on and off CP since her delivery.  Today at 7pm, it returned abruptly and was 10/10 in severity.  Pain radiated to the jaw and arm. She had arm tingling. [facility] her [facility] ECG demonstrated inferior and lateral TWI. Troponin I returned back at 32. BNP 275. [facility] her 1st ECG demonstrated inferior and lateral STE with reciprocal depressions in I and aVL. CP [facility]10 [facility].  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  The cath films were reviewed by several cardiologists with the plan for medical therapy.  Troponin >65.  Echo revealed EF of 45-50% with hypokinesis of the basal inferiormyocardium, G1DD.  Aggrastat and heparin drip were stopped and she was brought back to the lab for IVUS.   Dr Laneta SimmersBartle was notified of the case, however, the surgical targets are poor. She was started on ASA, plavix, statin, imdur and metoprolol.  Statin was later stopped because she will be breast feeding.  ACE-I was also avoided for this reason.  Colace given for constipation.  She ambulated when with Cardiac Rehab with no complaints.  She declined phase II CR. The patient was seen by Dr. Jens Somrenshaw who felt she was stable for DC home.    Consults: None  Significant Diagnostic Studies:   Lipid Panel     Component Value  Date/Time   CHOL 264* 11/17/2014 0522   TRIG 143 11/17/2014 0522   HDL 44 11/17/2014 0522   CHOLHDL 6.0 11/17/2014 0522   VLDL 29 11/17/2014 0522   LDLCALC 191* 11/17/2014 0522     Cardiac catheterization #2  Mid LAD to Dist LAD lesion, 70% stenosed.  Dist LAD lesion, 100% stenosed.  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 The distal LAD remains small caliber and was was previously noted, but the most apical portion of the LAD now appears to be completely occluded.  Recommendation:  The patient will be started on aspirin and Plavix. Angiograms were reviewed with multiple colleagues. Medical management approach is recommended..   Cardiac Cath #1  Mid LAD to Dist LAD lesion, 80% stenosed.  Dist LAD lesion, 80% stenosed.  Mid Cx lesion, 30% stenosed.  Low-normal level LV function with mild mid to basal inferior focal hypocontractility, and minimal apical inferior hypo-contractility with normal wall motion involving the anterolateral wall.  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  Mild smooth narrowing of the mid circumflex after the first marginal branch and before bifurcating at the second marginal branch with suggestion of possible coronary spasm at this site.  Normal right coronary artery.  RECOMMENDATION:  This appears to be a  spontaneous coronary artery dissection involving the LAD in this patient status post vaginal delivery of an apparent uncomplicated pregnancy. Angiograms will be reviewed with colleagues in am. Hopefully, her dissection will resolve with medical therapy. She left the catheterization laboratory with TIMI-3 flow. Thre will be sure strong reconsideration for sending her back to the laboratory for a repeat angiogram later today if recurrent chest pain continues to make certain there is not more antegrade propagation or development of occlusion.   Echo, Study Conclusions  - Left ventricle: The cavity size was normal. Systolic function was mildly reduced. The estimated ejection fraction was in the range of 45% to 50%. There is hypokinesis of the basalinferior myocardium. Doppler parameters are consistent with abnormal left ventricular relaxation (grade 1 diastolic dysfunction). - Mitral valve: There was mild regurgitation.  Impressions:  - EF is minimally reduced from prior.   Treatments: See above  Discharge Exam: Blood pressure 124/67, pulse 94, temperature 98.1 F (36.7 C), temperature source Oral, resp. rate 15, height 5' (1.524 m), weight 107 lb 12.9 oz (48.9 kg), SpO2 100 %, unknown if currently breastfeeding.   Disposition: 01-Home or Self Care      Discharge Instructions    Diet - low sodium heart healthy    Complete by:  As directed      Increase activity slowly    Complete by:  As directed             Medication List    STOP taking these medications        ibuprofen 600 MG tablet  Commonly known as:  ADVIL,MOTRIN     oxyCODONE-acetaminophen 5-325 MG per tablet  Commonly known as:  PERCOCET/ROXICET      TAKE these medications        acetaminophen 325 MG tablet  Commonly known as:  TYLENOL  Take 650 mg by mouth every 6 (six) hours as needed for mild pain.     aspirin 81 MG EC tablet  Take 1 tablet (81 mg total) by mouth daily.     clopidogrel 75  MG tablet  Commonly known as:  PLAVIX  Take 1 tablet (75 mg total) by mouth daily with breakfast.     docusate sodium 100 MG capsule  Commonly known as:  COLACE  Take 1 capsule (100 mg total) by mouth 2 (two) times daily.     isosorbide mononitrate 30 MG 24 hr tablet  Commonly known as:  IMDUR  Take 1 tablet (30 mg total) by mouth daily.     Metoprolol Tartrate 37.5 MG Tabs  Take 37.5 mg by mouth 2 (two) times daily.     nitroGLYCERIN 0.4 MG SL tablet  Commonly known as:  NITROSTAT  Place 1 tablet (0.4 mg total) under the tongue every 5 (five) minutes x 3 doses as needed for chest pain.     prenatal multivitamin Tabs tablet  Take 1 tablet by mouth daily at 12 noon.     promethazine 25 MG tablet  Commonly known as:  PHENERGAN  Take 1 tablet (25 mg total) by mouth every 6 (six) hours as needed for nausea or vomiting.     ranitidine 150 MG tablet  Commonly known as:  ZANTAC  Take 1 tablet (150 mg total) by mouth 2 (two) times daily.       Follow-up Information    Follow up with Jacolyn Reedy, PA-C On 12/16/2014.   Specialty:  Cardiology   Why:  8:15  AM.  Cardiology follow up   Contact information:   [address] [place] [address] [phone]      Greater than 30 minutes was spent completing the patient's discharge.    SignedWilburt Finlay, PAC 11/22/2014, 12:53 PM

## 2014-11-22 NOTE — Progress Notes (Addendum)
UR COMPLETED  

## 2014-11-22 NOTE — Progress Notes (Addendum)
Subjective: Feeling better.  No CP/SOB  Objective: Vital signs in last 24 hours: Temp:  [98 F (36.7 C)-98.7 F (37.1 C)] 98.1 F (36.7 C) (06/06 0640) Pulse Rate:  [63-94] 94 (06/06 1018) Resp:  [15-20] 15 (06/06 1018) BP: (113-144)/(50-86) 124/67 mmHg (06/06 1018) SpO2:  [96 %-100 %] 100 % (06/06 1018) Weight:  [107 lb 12.9 oz (48.9 kg)-108 lb 11 oz (49.3 kg)] 107 lb 12.9 oz (48.9 kg) (06/06 0640) Last BM Date: 11/22/14  Intake/Output from previous day: 06/05 0701 - 06/06 0700 In: 480 [P.O.:480] Out: -  Intake/Output this shift: Total I/O In: 120 [P.O.:120] Out: -   Medications Scheduled Meds: . aspirin EC  81 mg Oral Daily  . atorvastatin  80 mg Oral q1800  . clopidogrel  75 mg Oral Q breakfast  . isosorbide mononitrate  30 mg Oral Daily  . metoprolol tartrate  37.5 mg Oral BID  . sodium chloride  3 mL Intravenous Q12H   Continuous Infusions: . sodium chloride 999 mL/hr at 11/17/14 0140  . nitroGLYCERIN 15 mcg/min (11/20/14 1300)   PRN Meds:.sodium chloride, acetaminophen, docusate sodium, HYDROmorphone (DILAUDID) injection, morphine injection, nitroGLYCERIN, ondansetron (ZOFRAN) IV, oxyCODONE-acetaminophen, sodium chloride  PE: General appearance: alert, cooperative and no distress Lungs: clear to auscultation bilaterally Heart: regular rate and rhythm, S1, S2 normal, no murmur, click, rub or gallop Extremities: No LEE Pulses: 2+ and symmetric Skin: Warm and dry Neurologic: Grossly normal  Lab Results:   Recent Labs  11/20/14 0250 11/21/14 0353 11/22/14 0438  WBC 5.4 4.6 4.1  HGB 12.0 12.5 12.0  HCT 37.9 38.3 36.8  PLT 334 322 341   Lipid Panel     Component Value Date/Time   CHOL 264* 11/17/2014 0522   TRIG 143 11/17/2014 0522   HDL 44 11/17/2014 0522   CHOLHDL 6.0 11/17/2014 0522   VLDL 29 11/17/2014 0522   LDLCALC 191* 11/17/2014 0522      Assessment/Plan  Active Problems:   Chest pain   ST elevation myocardial infarction  involving left anterior descending (LAD) coronary artery   Acute coronary syndrome   Former tobacco abuse   HLD  LAD SCAD. 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 The images were reviewed and discussed in details with Dr Tresa Endo, Dr Clifton James and Dr Riley Kill. Dr Laneta Simmers was notified of the case, however the surgical targets are poor. - ASA, plavix (no heparin to avoid intraluminal hematoma and propagation) - Multiple colleagues reviewed - EF 55% on 6/1. Repeat ECHO with EF 45-50% inferobasal hypokinesis.  - Tolerating Imdur 30, off NTG IV  - No bowel movement - colace, may need miralax or senna. On opiates.  - No ACE-I because of breast feeding.  - atorvastatin, metoprolol.  - She wants to continue breastfeeding (currently pumping), we will avoid ACEI  - Ambulating well with cardiac rehab. Declines phase II.  -Dc home today.  FU with Dr. Delton See   LOS: 5 days    HAGER, BRYAN PA-C 11/22/2014 11:21 AM As above, patient seen and examined. Patient has not had chest pain in 5 days. She has been ambulating with no symptoms. Plan to continue aspirin, Plavix, beta blocker and nitrates. She does have significant hyperlipidemia. She will need a statin long-term. However we will review with pharmacy concerning safety while breast-feeding prior to discharge. Discharge later today and follow-up with Dr. Delton See. >30 min PA and physician time D2 Arlys John  Crenshaw  Reviewed with pharmacy; will not continue statin due to risk while breast feeding; resume later once she stops breast feeding. Olga MillersBrian Crenshaw

## 2014-11-22 NOTE — Progress Notes (Signed)
Jenny HillockLatrice Okeefe to be D/C'd Home per MD order.  Discussed prescriptions and follow up appointments with the patient. Prescriptions given to patient, medication list explained in detail. Pt verbalized understanding.    Medication List    STOP taking these medications        ibuprofen 600 MG tablet  Commonly known as:  ADVIL,MOTRIN     oxyCODONE-acetaminophen 5-325 MG per tablet  Commonly known as:  PERCOCET/ROXICET      TAKE these medications        acetaminophen 325 MG tablet  Commonly known as:  TYLENOL  Take 650 mg by mouth every 6 (six) hours as needed for mild pain.     aspirin 81 MG EC tablet  Take 1 tablet (81 mg total) by mouth daily.     clopidogrel 75 MG tablet  Commonly known as:  PLAVIX  Take 1 tablet (75 mg total) by mouth daily with breakfast.     docusate sodium 100 MG capsule  Commonly known as:  COLACE  Take 1 capsule (100 mg total) by mouth 2 (two) times daily.     isosorbide mononitrate 30 MG 24 hr tablet  Commonly known as:  IMDUR  Take 1 tablet (30 mg total) by mouth daily.     Metoprolol Tartrate 37.5 MG Tabs  Take 37.5 mg by mouth 2 (two) times daily.     nitroGLYCERIN 0.4 MG SL tablet  Commonly known as:  NITROSTAT  Place 1 tablet (0.4 mg total) under the tongue every 5 (five) minutes x 3 doses as needed for chest pain.     prenatal multivitamin Tabs tablet  Take 1 tablet by mouth daily at 12 noon.     promethazine 25 MG tablet  Commonly known as:  PHENERGAN  Take 1 tablet (25 mg total) by mouth every 6 (six) hours as needed for nausea or vomiting.     ranitidine 150 MG tablet  Commonly known as:  ZANTAC  Take 1 tablet (150 mg total) by mouth 2 (two) times daily.        Filed Vitals:   11/22/14 1018  BP: 124/67  Pulse: 94  Temp:   Resp: 15    Skin clean, dry and intact without evidence of skin break down, no evidence of skin tears noted. IV catheter discontinued intact. Site without signs and symptoms of complications. Dressing and  pressure applied. Pt denies pain at this time. No complaints noted.  An After Visit Summary was printed and given to the patient. Patient escorted via WC, and D/C home via private auto.  [name] A 11/22/2014 4:11 PM

## 2014-11-22 NOTE — Discharge Instructions (Signed)
1.  Use the nitroglycerin tabs(small bottle) if yo develop chest pain:   If you have chest pain, take ONE tablet and wait five minutes.  If you continue to have chest pain, take a SECOND tablet and wait five minutes.  If you continue to have chest pain, take a THIRD table and call 911!

## 2014-12-01 ENCOUNTER — Ambulatory Visit: Payer: Medicaid Other | Admitting: Obstetrics & Gynecology

## 2014-12-10 ENCOUNTER — Ambulatory Visit (INDEPENDENT_AMBULATORY_CARE_PROVIDER_SITE_OTHER): Payer: Medicaid Other | Admitting: Obstetrics & Gynecology

## 2014-12-10 ENCOUNTER — Encounter: Payer: Self-pay | Admitting: Obstetrics & Gynecology

## 2014-12-10 ENCOUNTER — Telehealth: Payer: Self-pay | Admitting: *Deleted

## 2014-12-10 VITALS — BP 120/54 | HR 78 | Temp 98.4°F | Ht 60.0 in | Wt 109.2 lb

## 2014-12-10 DIAGNOSIS — Z8719 Personal history of other diseases of the digestive system: Secondary | ICD-10-CM

## 2014-12-10 MED ORDER — RANITIDINE HCL 150 MG PO TABS: 150.0000 mg | ORAL_TABLET | Freq: Two times a day (BID) | ORAL | Status: DC

## 2014-12-10 NOTE — Telephone Encounter (Addendum)
Per Dr. Debroah Loop  Per Anesthesia needs  to wait 6 months before any elective surgery, needs to return to discuss alternative to BTL in meantime. Dr. Debroah Loop tried to call no answer.

## 2014-12-10 NOTE — Patient Instructions (Signed)
Contraception Choices Contraception (birth control) is the use of any methods or devices to prevent pregnancy. Below are some methods to help avoid pregnancy. HORMONAL METHODS   Contraceptive implant. This is a thin, plastic tube containing progesterone hormone. It does not contain estrogen hormone. Your health care provider inserts the tube in the inner part of the upper arm. The tube can remain in place for up to 3 years. After 3 years, the implant must be removed. The implant prevents the ovaries from releasing an egg (ovulation), thickens the cervical mucus to prevent sperm from entering the uterus, and thins the lining of the inside of the uterus.  Progesterone-only injections. These injections are given every 3 months by your health care provider to prevent pregnancy. This synthetic progesterone hormone stops the ovaries from releasing eggs. It also thickens cervical mucus and changes the uterine lining. This makes it harder for sperm to survive in the uterus.  Birth control pills. These pills contain estrogen and progesterone hormone. They work by preventing the ovaries from releasing eggs (ovulation). They also cause the cervical mucus to thicken, preventing the sperm from entering the uterus. Birth control pills are prescribed by a health care provider.Birth control pills can also be used to treat heavy periods.  Minipill. This type of birth control pill contains only the progesterone hormone. They are taken every day of each month and must be prescribed by your health care provider.  Birth control patch. The patch contains hormones similar to those in birth control pills. It must be changed once a week and is prescribed by a health care provider.  Vaginal ring. The ring contains hormones similar to those in birth control pills. It is left in the vagina for 3 weeks, removed for 1 week, and then a new one is put back in place. The patient must be comfortable inserting and removing the ring  from the vagina.A health care provider's prescription is necessary.  Emergency contraception. Emergency contraceptives prevent pregnancy after unprotected sexual intercourse. This pill can be taken right after sex or up to 5 days after unprotected sex. It is most effective the sooner you take the pills after having sexual intercourse. Most emergency contraceptive pills are available without a prescription. Check with your pharmacist. Do not use emergency contraception as your only form of birth control. BARRIER METHODS   Female condom. This is a thin sheath (latex or rubber) that is worn over the penis during sexual intercourse. It can be used with spermicide to increase effectiveness.  Female condom. This is a soft, loose-fitting sheath that is put into the vagina before sexual intercourse.  Diaphragm. This is a soft, latex, dome-shaped barrier that must be fitted by a health care provider. It is inserted into the vagina, along with a spermicidal jelly. It is inserted before intercourse. The diaphragm should be left in the vagina for 6 to 8 hours after intercourse.  Cervical cap. This is a round, soft, latex or plastic cup that fits over the cervix and must be fitted by a health care provider. The cap can be left in place for up to 48 hours after intercourse.  Sponge. This is a soft, circular piece of polyurethane foam. The sponge has spermicide in it. It is inserted into the vagina after wetting it and before sexual intercourse.  Spermicides. These are chemicals that kill or block sperm from entering the cervix and uterus. They come in the form of creams, jellies, suppositories, foam, or tablets. They do not require a   prescription. They are inserted into the vagina with an applicator before having sexual intercourse. The process must be repeated every time you have sexual intercourse. INTRAUTERINE CONTRACEPTION  Intrauterine device (IUD). This is a T-shaped device that is put in a woman's uterus  during a menstrual period to prevent pregnancy. There are 2 types:  Copper IUD. This type of IUD is wrapped in copper wire and is placed inside the uterus. Copper makes the uterus and fallopian tubes produce a fluid that kills sperm. It can stay in place for 10 years.  Hormone IUD. This type of IUD contains the hormone progestin (synthetic progesterone). The hormone thickens the cervical mucus and prevents sperm from entering the uterus, and it also thins the uterine lining to prevent implantation of a fertilized egg. The hormone can weaken or kill the sperm that get into the uterus. It can stay in place for 3-5 years, depending on which type of IUD is used. PERMANENT METHODS OF CONTRACEPTION  Female tubal ligation. This is when the woman's fallopian tubes are surgically sealed, tied, or blocked to prevent the egg from traveling to the uterus.  Hysteroscopic sterilization. This involves placing a small coil or insert into each fallopian tube. Your doctor uses a technique called hysteroscopy to do the procedure. The device causes scar tissue to form. This results in permanent blockage of the fallopian tubes, so the sperm cannot fertilize the egg. It takes about 3 months after the procedure for the tubes to become blocked. You must use another form of birth control for these 3 months.  Female sterilization. This is when the female has the tubes that carry sperm tied off (vasectomy).This blocks sperm from entering the vagina during sexual intercourse. After the procedure, the man can still ejaculate fluid (semen). NATURAL PLANNING METHODS  Natural family planning. This is not having sexual intercourse or using a barrier method (condom, diaphragm, cervical cap) on days the woman could become pregnant.  Calendar method. This is keeping track of the length of each menstrual cycle and identifying when you are fertile.  Ovulation method. This is avoiding sexual intercourse during ovulation.  Symptothermal  method. This is avoiding sexual intercourse during ovulation, using a thermometer and ovulation symptoms.  Post-ovulation method. This is timing sexual intercourse after you have ovulated. Regardless of which type or method of contraception you choose, it is important that you use condoms to protect against the transmission of sexually transmitted infections (STIs). Talk with your health care provider about which form of contraception is most appropriate for you. Document Released: 06/04/2005 Document Revised: 06/09/2013 Document Reviewed: 11/27/2012 ExitCare Patient Information 2015 [employer]. This information is not intended to replace advice given to you by your health care provider. Make sure you discuss any questions you have with your health care provider.  

## 2014-12-10 NOTE — Progress Notes (Signed)
Patient ID: Jenny Myers, female   DOB: [birth year], 24 y.o.   MRN: [id number] Subjective:s/p SVD, had MI PP 11/17/14     Jenny Myers is a 24 y.o. female who presents for a postpartum visit. She is 5 weeks postpartum following a spontaneous vaginal delivery. I have fully reviewed the prenatal and intrapartum course. The delivery was at *38 gestational weeks. Outcome: spontaneous vaginal delivery. Anesthesia: epidural. Postpartum course has been complicated by STEMI. Baby's course has been good. Baby is feeding by both breast and bottle - Similac Advance. Bleeding no bleeding. Bowel function is normal. Bladder function is normal. Patient is not sexually active. Contraception method is abstinence and but wants BTL. Postpartum depression screening: negative.  The following portions of the patient's history were reviewed and updated as appropriate: allergies, current medications, past family history, past medical history, past social history, past surgical history and problem list.  Review of Systems Pertinent items are noted in HPI.   Objective:    BP 120/54 mmHg  Pulse 78  Temp(Src) 98.4 F (36.9 C)  General:  alert, cooperative and no distress   Breasts:     Lungs: nl effort  Heart:     Abdomen: soft, non-tender; bowel sounds normal; no masses,  no organomegaly   Vulva:  normal  Vagina: not evaluated  Cervix:     Corpus: not examined  Adnexa:  not evaluated  Rectal Exam: Not performed.        Assessment:     normal postpartum exam. Pap smear not done at today's visit. Had STEMI recently followed by cardiology  Plan:    1. Contraception: abstinence 2. Spoke to Dr Dwana Curd anesthesia, rec 6 mo until elective surgery after MI 3. Follow up in: 2 weeks or as needed. to discuss al [facility]  Adam Phenix, MD 12/10/2014

## 2014-12-10 NOTE — Telephone Encounter (Signed)
Called Jenny Myers and notified her per Dr. Debroah Loop per anesthesia she may not have elective surgery for 6 months following heart attack. She may make an appointment to see provider if she wants to discuss other alternative birth control methods. Jenny Myers states she is not sure what she wants to do, but will call to make an appointment if she wants to discuss other method.

## 2014-12-16 ENCOUNTER — Encounter: Payer: Medicaid Other | Admitting: Physician Assistant

## 2014-12-27 ENCOUNTER — Encounter: Payer: Self-pay | Admitting: Physician Assistant

## 2015-04-25 ENCOUNTER — Encounter: Payer: Self-pay | Admitting: *Deleted

## 2015-05-16 ENCOUNTER — Encounter (HOSPITAL_COMMUNITY): Payer: Self-pay | Admitting: *Deleted

## 2015-05-16 ENCOUNTER — Inpatient Hospital Stay (HOSPITAL_COMMUNITY)
Admission: EM | Admit: 2015-05-16 | Discharge: 2015-05-19 | DRG: 316 | Disposition: A | Discharge status: Home / Self Care | Payer: Medicaid Other | Attending: Internal Medicine | Admitting: Internal Medicine

## 2015-05-16 ENCOUNTER — Emergency Department (HOSPITAL_COMMUNITY): Payer: Medicaid Other

## 2015-05-16 DIAGNOSIS — R079 Chest pain, unspecified: Secondary | ICD-10-CM

## 2015-05-16 DIAGNOSIS — R110 Nausea: Secondary | ICD-10-CM | POA: Diagnosis present

## 2015-05-16 DIAGNOSIS — I252 Old myocardial infarction: Secondary | ICD-10-CM

## 2015-05-16 DIAGNOSIS — Z7982 Long term (current) use of aspirin: Secondary | ICD-10-CM | POA: Diagnosis not present

## 2015-05-16 DIAGNOSIS — Z87891 Personal history of nicotine dependence: Secondary | ICD-10-CM | POA: Diagnosis not present

## 2015-05-16 DIAGNOSIS — I309 Acute pericarditis, unspecified: Secondary | ICD-10-CM | POA: Diagnosis present

## 2015-05-16 DIAGNOSIS — I209 Angina pectoris, unspecified: Secondary | ICD-10-CM

## 2015-05-16 DIAGNOSIS — I519 Heart disease, unspecified: Secondary | ICD-10-CM | POA: Diagnosis not present

## 2015-05-16 DIAGNOSIS — K219 Gastro-esophageal reflux disease without esophagitis: Secondary | ICD-10-CM | POA: Diagnosis present

## 2015-05-16 DIAGNOSIS — Z7902 Long term (current) use of antithrombotics/antiplatelets: Secondary | ICD-10-CM

## 2015-05-16 DIAGNOSIS — I2102 ST elevation (STEMI) myocardial infarction involving left anterior descending coronary artery: Secondary | ICD-10-CM | POA: Diagnosis present

## 2015-05-16 DIAGNOSIS — R072 Precordial pain: Secondary | ICD-10-CM

## 2015-05-16 DIAGNOSIS — R55 Syncope and collapse: Secondary | ICD-10-CM | POA: Diagnosis present

## 2015-05-16 DIAGNOSIS — E876 Hypokalemia: Secondary | ICD-10-CM | POA: Diagnosis present

## 2015-05-16 DIAGNOSIS — R0781 Pleurodynia: Secondary | ICD-10-CM | POA: Diagnosis not present

## 2015-05-16 DIAGNOSIS — Z8719 Personal history of other diseases of the digestive system: Secondary | ICD-10-CM

## 2015-05-16 LAB — BASIC METABOLIC PANEL
Anion gap: 10 (ref 5–15)
BUN: 8 mg/dL (ref 6–20)
CHLORIDE: 101 mmol/L (ref 101–111)
CO2: 18 mmol/L — ABNORMAL LOW (ref 22–32)
Calcium: 8.8 mg/dL — ABNORMAL LOW (ref 8.9–10.3)
Creatinine, Ser: 0.47 mg/dL (ref 0.44–1.00)
GFR calc Af Amer: >60 mL/min (ref >60)
GLUCOSE: 76 mg/dL (ref 65–99)
POTASSIUM: 3.4 mmol/L — AB (ref 3.5–5.1)
Sodium: 129 mmol/L — ABNORMAL LOW (ref 135–145)

## 2015-05-16 LAB — I-STAT TROPONIN, ED: Troponin i, poc: 0 ng/mL (ref 0.00–0.08)

## 2015-05-16 LAB — CBC
HEMATOCRIT: 38.5 % (ref 36.0–46.0)
Hemoglobin: 13.2 g/dL (ref 12.0–15.0)
MCH: 30.3 pg (ref 26.0–34.0)
MCHC: 34.3 g/dL (ref 30.0–36.0)
MCV: 88.3 fL (ref 78.0–100.0)
Platelets: 359 10*3/uL (ref 150–400)
RBC: 4.36 MIL/uL (ref 3.87–5.11)
RDW: 13.7 % (ref 11.5–15.5)
WBC: 7.8 10*3/uL (ref 4.0–10.5)

## 2015-05-16 LAB — D-DIMER, QUANTITATIVE (NOT AT ARMC): D DIMER QUANT: 0.3 ug{FEU}/mL (ref 0.00–0.50)

## 2015-05-16 MED ORDER — ONDANSETRON HCL 4 MG/2ML IJ SOLN
4.0000 mg | Freq: Once | INTRAMUSCULAR | Status: AC
  Administered 2015-05-16: 4 mg via INTRAVENOUS
  Filled 2015-05-16: qty 2

## 2015-05-16 MED ORDER — NITROGLYCERIN 0.4 MG SL SUBL
0.4000 mg | SUBLINGUAL_TABLET | SUBLINGUAL | Status: DC | PRN
  Administered 2015-05-16 (×3): 0.4 mg via SUBLINGUAL
  Filled 2015-05-16: qty 1

## 2015-05-16 MED ORDER — MORPHINE SULFATE (PF) 2 MG/ML IV SOLN
2.0000 mg | Freq: Once | INTRAVENOUS | Status: AC
  Administered 2015-05-16: 2 mg via INTRAVENOUS
  Filled 2015-05-16: qty 1

## 2015-05-16 MED ORDER — SODIUM CHLORIDE 0.9 % IV BOLUS (SEPSIS)
1000.0000 mL | Freq: Once | INTRAVENOUS | Status: AC
  Administered 2015-05-16: 1000 mL via INTRAVENOUS

## 2015-05-16 MED ORDER — MORPHINE SULFATE (PF) 4 MG/ML IV SOLN
4.0000 mg | Freq: Once | INTRAVENOUS | Status: AC
  Administered 2015-05-16: 4 mg via INTRAVENOUS
  Filled 2015-05-16: qty 1

## 2015-05-16 NOTE — H&P (Signed)
Patient ID: Jenny HillockLatrice Hamm MRN: [id number], DOB/AGE: 26/02/1991   Admit date: 05/16/2015   Primary Physician: No primary care provider on file. Primary Cardiologist: Dr. Delton SeeNelson (never followed up in clinic)  HPI:  Jenny Myers is a 24 y.o. female with a history of STEMI after vaginal delivery (11/05/14) following an uncomplicated pregnancy 11/16/2014. 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 The cath films were reviewed by several cardiologists with the plan for medical therapy. Troponin >65. Echo revealed EF of 45-50% with hypokinesis of the basal inferiormyocardium, G1DD. Aggrastat and heparin drip were stopped and she was brought back to the lab for IVUS. Dr Laneta SimmersBartle was notified of the case, however, the surgical targets are poor.   Since then she has been having intermittent substernal chest stabbing pain that resolved by his self within 30 to 60 minutes. This morning she has developed a sudden onset of substernal chest pressure/stabbing pain that radiated to her left arm. Pain continues and she went to pick up her husband where she passed out for approximately 10-15 minutes. She took sublingual nitroglycerin x 2 without any improvement that lead her to ED presentation. She also admits to having shortness of breath and nausea. Denies exertional shortness of breath or chest pain at home. Denies lower extremity edema, dizziness, palpitations, orthopnea, PND, melena, blood in her stool.taking deep breath makes her pain worse. She said that she is compliant with medications.   In ED, Point-of-care troponin negative. D-dimer negative. Chest x-ray. EKG showed sinus rhythm with sinus arrhythmia and nonspecific T-wave abnormality. K of 3.4. She was just given IV morphine, IV Zofran and sublingual nitroglycerin.  Problem List  Past Medical History  Diagnosis Date  . Ectopic pregnancy 11/2010    Right  salpingectomy  . Umbilical hernia   . Infection     chlamydia  . Heart attack (HCC) 11/16/14    Past Surgical History  Procedure Laterality Date  . Salpingectomy  2012    left ovary, ruptured ectopic  . Wisdom tooth extraction    . Induced abortion    . Cardiac catheterization N/A 11/17/2014    Procedure: Left Heart Cath and Coronary Angiography;  Surgeon: Lennette Biharihomas A Kelly, MD;  [facility];  Service: Cardiovascular;  Laterality: N/A;  . Cardiac catheterization N/A 11/17/2014    Procedure: Left Heart Cath and Coronary Angiography;  Surgeon: Lennette Biharihomas A Kelly, MD;  Location: [facility] INVASIVE CV LAB;  Service: Cardiovascular;  Laterality: N/A;     Allergies  Allergies  Allergen Reactions  . Garlic Anaphylaxis  . Penicillins Anaphylaxis and Nausea And Vomiting  . Fentanyl Hives  . Carrot Flavor Hives     Home Medications  Prior to Admission medications   Medication Sig Start Date End Date Taking? Authorizing Provider  acetaminophen (TYLENOL) 325 MG tablet Take 650 mg by mouth every 6 (six) hours as needed for mild pain.     Historical Provider, MD  aspirin EC 81 MG EC tablet Take 1 tablet (81 mg total) by mouth daily. 11/22/14   Dwana MelenaBryan W Hager, PA-C  clopidogrel (PLAVIX) 75 MG tablet Take 1 tablet (75 mg total) by mouth daily with breakfast. 11/22/14   Dwana MelenaBryan W Hager, PA-C  docusate sodium (COLACE) 100 MG capsule Take 1 capsule (100 mg total) by mouth 2 (two) times daily. 11/07/14   Smitty CordsAlexander J Karamalegos, DO  ibuprofen (ADVIL,MOTRIN) 600 MG tablet Take 600 mg by  mouth.    Historical Provider, MD  isosorbide mononitrate (IMDUR) 30 MG 24 hr tablet Take 1 tablet (30 mg total) by mouth daily. 11/22/14   Dwana Melena, PA-C  Metoprolol Tartrate 37.5 MG TABS Take 37.5 mg by mouth 2 (two) times daily. 11/22/14   Dwana Melena, PA-C  nitroGLYCERIN (NITROSTAT) 0.4 MG SL tablet Place 1 tablet (0.4 mg total) under the tongue every 5 (five) minutes x 3 doses as needed for chest pain. 11/22/14   Dwana Melena, PA-C  Prenatal Vit-Fe Fumarate-FA (PRENATAL MULTIVITAMIN) TABS tablet Take 1 tablet by mouth daily at 12 noon.    Historical Provider, MD  ranitidine (ZANTAC) 150 MG tablet Take 1 tablet (150 mg total) by mouth 2 (two) times daily. 12/10/14   Adam Phenix, MD    Family History  Family History  Problem Relation Age of Onset  . Hypertension Mother   . Diabetes Father   . Hypertension Father   . Cancer Father     breast  . Vision loss Paternal Grandmother   . Other Neg Hx    Family Status  Relation Status Death Age  . Mother Alive   . Father Alive     Social History  Social History   Social History  . Marital Status: Single    Spouse Name: N/A  . Number of Children: N/A  . Years of Education: N/A   Occupational History  . Not on file.   Social History Main Topics  . Smoking status: Former Games developer  . Smokeless tobacco: Never Used     Comment: quit with preg  . Alcohol Use: No     Comment: not since pregnancy  . Drug Use: No  . Sexual Activity: Not Currently    Birth Control/ Protection: None   Other Topics Concern  . Not on file   Social History Narrative    All other systems reviewed and are otherwise negative except as noted above.  Physical Exam  Blood pressure 112/68, pulse 91, temperature 97.9 F (36.6 C), temperature source Oral, resp. rate 11, height 5' (1.524 m), weight 110 lb (49.896 kg), last menstrual period 04/15/2015, SpO2 99 %, currently breastfeeding.  General: uncomfortable, in pain with classic levine sign with deep breath laying in bed.  Psych: Normal affect. Neuro: Alert and oriented X 3. Moves all extremities spontaneously. HEENT: Normal  Neck: Supple without bruits or JVD. Lungs:  Resp regular and unlabored, CTA. Heart: RRR no s3, s4, or murmurs. Abdomen: Soft, non-tender, non-distended, BS + x 4.  Extremities: No clubbing, cyanosis or edema. DP/PT/Radials 2+ and equal bilaterally.  Labs  No results for input(s): CKTOTAL,  CKMB, TROPONINI in the last 72 hours. Lab Results  Component Value Date   WBC 7.8 05/16/2015   HGB 13.2 05/16/2015   HCT 38.5 05/16/2015   MCV 88.3 05/16/2015   PLT 359 05/16/2015    Recent Labs Lab 05/16/15 1721  NA 129*  K 3.4*  CL 101  CO2 18*  BUN 8  CREATININE 0.47  CALCIUM 8.8*  GLUCOSE 76   Lab Results  Component Value Date   CHOL 264* 11/17/2014   HDL 44 11/17/2014   LDLCALC 191* 11/17/2014   TRIG 143 11/17/2014   Lab Results  Component Value Date   DDIMER 0.30 05/16/2015     Radiology/Studies  Dg Chest 2 View  05/16/2015  CLINICAL DATA:  24 year old female with acute chest pain and shortness of breath today. EXAM: CHEST  2  VIEW COMPARISON:  11/16/2014 and 04/21/2013 chest radiographs FINDINGS: The cardiomediastinal silhouette is unremarkable. There is no evidence of focal airspace disease, pulmonary edema, suspicious pulmonary nodule/mass, pleural effusion, or pneumothorax. No acute bony abnormalities are identified. IMPRESSION: No active cardiopulmonary disease. Electronically Signed   By: Harmon Pier M.D.   On: 05/16/2015 18:42    ECG  Vent. rate 80 BPM PR interval 152 ms QRS duration 68 ms QT/QTc 358/412 ms P-R-T axes 67 104 2  11/20/14 echo LV EF: 45% -  50%  ------------------------------------------------------------------- Indications:   MI - acute 410.91.  ------------------------------------------------------------------- History:  PMH:  Angina pectoris.  ------------------------------------------------------------------- Study Conclusions  - Left ventricle: The cavity size was normal. Systolic function was mildly reduced. The estimated ejection fraction was in the range of 45% to 50%. There is hypokinesis of the basalinferior myocardium. Doppler parameters are consistent with abnormal left ventricular relaxation (grade 1 diastolic dysfunction). - Mitral valve: There was mild regurgitation.  Impressions:  - EF is  minimally reduced from prior.  ASSESSMENT AND PLAN   1. Chest pain - Her chest pain is out of proportion from physical exam and labs.  - Point-of-care troponin negative. D-dimer negative. Chest x-ray. EKG showed sinus rhythm with sinus arrhythmia and nonspecific T-wave abnormality. K of 3.4. She was just given IV morphine, IV Zofran and sublingual nitroglycerin. - Differential include PE, aortic dissection, coronary dissection, or drug abuse. - Will discuss plan with M.D.  2. Hx coronary dissection of the LAD.  3. Syncope  4. Hypokalemia - K of 3.4. - Will give supplement. Repeat BMET tomorrow.   Signed, Manson Passey, PA-C 05/16/2015, 8:09 PM Pager [phone]

## 2015-05-16 NOTE — ED Notes (Signed)
Attempted to call report

## 2015-05-16 NOTE — ED Notes (Signed)
Pt reports mid chest pain that started this am, reports sob and nausea. Hx of MI and reports "taking her heart attack meds with no relief."

## 2015-05-17 ENCOUNTER — Inpatient Hospital Stay (HOSPITAL_COMMUNITY): Payer: Medicaid Other

## 2015-05-17 DIAGNOSIS — I519 Heart disease, unspecified: Secondary | ICD-10-CM

## 2015-05-17 DIAGNOSIS — R079 Chest pain, unspecified: Secondary | ICD-10-CM

## 2015-05-17 DIAGNOSIS — R55 Syncope and collapse: Secondary | ICD-10-CM | POA: Diagnosis present

## 2015-05-17 DIAGNOSIS — I209 Angina pectoris, unspecified: Secondary | ICD-10-CM

## 2015-05-17 DIAGNOSIS — I2102 ST elevation (STEMI) myocardial infarction involving left anterior descending coronary artery: Secondary | ICD-10-CM

## 2015-05-17 LAB — RAPID URINE DRUG SCREEN, HOSP PERFORMED
Amphetamines: NOT DETECTED
BARBITURATES: NOT DETECTED
Benzodiazepines: NOT DETECTED
COCAINE: NOT DETECTED
OPIATES: POSITIVE — AB
Tetrahydrocannabinol: POSITIVE — AB

## 2015-05-17 LAB — BASIC METABOLIC PANEL WITH GFR
Anion gap: 9 (ref 5–15)
BUN: <5 mg/dL — ABNORMAL LOW (ref 6–20)
CO2: 19 mmol/L — ABNORMAL LOW (ref 22–32)
Calcium: 9 mg/dL (ref 8.9–10.3)
Chloride: 108 mmol/L (ref 101–111)
Creatinine, Ser: 0.4 mg/dL — ABNORMAL LOW (ref 0.44–1.00)
GFR calc Af Amer: >60 mL/min
GFR calc non Af Amer: >60 mL/min
Glucose, Bld: 86 mg/dL (ref 65–99)
Potassium: 3.7 mmol/L (ref 3.5–5.1)
Sodium: 136 mmol/L (ref 135–145)

## 2015-05-17 LAB — TROPONIN I
Troponin I: <0.03 ng/mL (ref <0.031)
Troponin I: <0.03 ng/mL
Troponin I: <0.03 ng/mL

## 2015-05-17 LAB — HEPARIN LEVEL (UNFRACTIONATED)
Heparin Unfractionated: 0.17 IU/mL — ABNORMAL LOW (ref 0.30–0.70)
Heparin Unfractionated: 0.25 IU/mL — ABNORMAL LOW (ref 0.30–0.70)

## 2015-05-17 LAB — LIPID PANEL
CHOL/HDL RATIO: 4.1 ratio
Cholesterol: 183 mg/dL (ref 0–200)
HDL: 45 mg/dL (ref >40)
LDL CALC: 117 mg/dL — AB (ref 0–99)
Triglycerides: 106 mg/dL (ref <150)
VLDL: 21 mg/dL (ref 0–40)

## 2015-05-17 LAB — TSH: TSH: 2.012 u[IU]/mL (ref 0.350–4.500)

## 2015-05-17 LAB — MRSA PCR SCREENING: MRSA BY PCR: NEGATIVE

## 2015-05-17 MED ORDER — NITROGLYCERIN 0.4 MG SL SUBL
0.4000 mg | SUBLINGUAL_TABLET | SUBLINGUAL | Status: DC | PRN
  Administered 2015-05-17: 0.4 mg via SUBLINGUAL

## 2015-05-17 MED ORDER — METOPROLOL TARTRATE 1 MG/ML IV SOLN
5.0000 mg | INTRAVENOUS | Status: DC | PRN
  Administered 2015-05-17: 5 mg via INTRAVENOUS

## 2015-05-17 MED ORDER — DIPHENHYDRAMINE HCL 25 MG PO CAPS
25.0000 mg | ORAL_CAPSULE | Freq: Four times a day (QID) | ORAL | Status: DC | PRN
  Administered 2015-05-17 – 2015-05-19 (×5): 25 mg via ORAL
  Filled 2015-05-17 (×6): qty 1

## 2015-05-17 MED ORDER — ASPIRIN EC 81 MG PO TBEC: 81.0000 mg | DELAYED_RELEASE_TABLET | Freq: Every day | ORAL | Status: DC

## 2015-05-17 MED ORDER — ASPIRIN EC 81 MG PO TBEC
81.0000 mg | DELAYED_RELEASE_TABLET | Freq: Every day | ORAL | Status: DC
  Administered 2015-05-17 – 2015-05-19 (×3): 81 mg via ORAL
  Filled 2015-05-17 (×3): qty 1

## 2015-05-17 MED ORDER — ONDANSETRON HCL 4 MG/2ML IJ SOLN
4.0000 mg | Freq: Four times a day (QID) | INTRAMUSCULAR | Status: DC | PRN
  Administered 2015-05-17: 4 mg via INTRAVENOUS
  Filled 2015-05-17: qty 2

## 2015-05-17 MED ORDER — FAMOTIDINE 20 MG PO TABS
20.0000 mg | ORAL_TABLET | Freq: Every day | ORAL | Status: DC
Start: 2015-05-17 — End: 2015-05-19
  Administered 2015-05-17 – 2015-05-19 (×3): 20 mg via ORAL
  Filled 2015-05-17 (×3): qty 1

## 2015-05-17 MED ORDER — METOPROLOL TARTRATE 25 MG PO TABS
37.5000 mg | ORAL_TABLET | Freq: Two times a day (BID) | ORAL | Status: DC
  Administered 2015-05-17 – 2015-05-18 (×3): 37.5 mg via ORAL
  Filled 2015-05-17 (×2): qty 2
  Filled 2015-05-17: qty 1.5
  Filled 2015-05-17: qty 2
  Filled 2015-05-17: qty 1.5

## 2015-05-17 MED ORDER — METOPROLOL TARTRATE 25 MG PO TABS
25.0000 mg | ORAL_TABLET | Freq: Once | ORAL | Status: AC
  Administered 2015-05-17: 25 mg via ORAL
  Filled 2015-05-17: qty 1

## 2015-05-17 MED ORDER — INDOMETHACIN 25 MG PO CAPS
25.0000 mg | ORAL_CAPSULE | Freq: Two times a day (BID) | ORAL | Status: DC
  Administered 2015-05-17 – 2015-05-18 (×2): 25 mg via ORAL
  Filled 2015-05-17 (×3): qty 1

## 2015-05-17 MED ORDER — NITROGLYCERIN 0.4 MG SL SUBL: 0.4000 mg | SUBLINGUAL_TABLET | SUBLINGUAL | Status: DC | PRN

## 2015-05-17 MED ORDER — HEPARIN BOLUS VIA INFUSION
3000.0000 [IU] | Freq: Once | INTRAVENOUS | Status: AC
  Administered 2015-05-17: 3000 [IU] via INTRAVENOUS
  Filled 2015-05-17: qty 3000

## 2015-05-17 MED ORDER — MORPHINE SULFATE (PF) 4 MG/ML IV SOLN
4.0000 mg | INTRAVENOUS | Status: DC | PRN
  Administered 2015-05-17 – 2015-05-19 (×4): 4 mg via INTRAVENOUS
  Filled 2015-05-17 (×5): qty 1

## 2015-05-17 MED ORDER — IOHEXOL 350 MG/ML SOLN
80.0000 mL | Freq: Once | INTRAVENOUS | Status: AC | PRN
  Administered 2015-05-17: 80 mL via INTRAVENOUS

## 2015-05-17 MED ORDER — PRENATAL MULTIVITAMIN CH
1.0000 | ORAL_TABLET | Freq: Every day | ORAL | Status: DC
  Administered 2015-05-17: 1 via ORAL
  Filled 2015-05-17 (×4): qty 1

## 2015-05-17 MED ORDER — GADOBENATE DIMEGLUMINE 529 MG/ML IV SOLN
15.0000 mL | Freq: Once | INTRAVENOUS | Status: AC | PRN
  Administered 2015-05-17: 15 mL via INTRAVENOUS

## 2015-05-17 MED ORDER — HEPARIN (PORCINE) IN NACL 100-0.45 UNIT/ML-% IJ SOLN
850.0000 [IU]/h | INTRAMUSCULAR | Status: DC
  Administered 2015-05-17: 600 [IU]/h via INTRAVENOUS
  Filled 2015-05-17 (×2): qty 250

## 2015-05-17 MED ORDER — ACETAMINOPHEN 325 MG PO TABS
650.0000 mg | ORAL_TABLET | ORAL | Status: DC | PRN
  Administered 2015-05-19: 650 mg via ORAL
  Filled 2015-05-17: qty 2

## 2015-05-17 MED ORDER — HEPARIN BOLUS VIA INFUSION
750.0000 [IU] | Freq: Once | INTRAVENOUS | Status: AC
  Administered 2015-05-17: 750 [IU] via INTRAVENOUS
  Filled 2015-05-17: qty 750

## 2015-05-17 MED ORDER — COLCHICINE 0.6 MG PO TABS
0.6000 mg | ORAL_TABLET | Freq: Two times a day (BID) | ORAL | Status: DC
  Administered 2015-05-17 – 2015-05-19 (×4): 0.6 mg via ORAL
  Filled 2015-05-17 (×5): qty 1

## 2015-05-17 MED ORDER — KETOROLAC TROMETHAMINE 30 MG/ML IJ SOLN
30.0000 mg | Freq: Once | INTRAMUSCULAR | Status: AC
  Administered 2015-05-17: 30 mg via INTRAVENOUS
  Filled 2015-05-17: qty 1

## 2015-05-17 MED ORDER — POTASSIUM CHLORIDE CRYS ER 20 MEQ PO TBCR
40.0000 meq | EXTENDED_RELEASE_TABLET | Freq: Once | ORAL | Status: AC
  Administered 2015-05-17: 40 meq via ORAL
  Filled 2015-05-17: qty 2

## 2015-05-17 MED ORDER — CLOPIDOGREL BISULFATE 75 MG PO TABS
75.0000 mg | ORAL_TABLET | Freq: Every day | ORAL | Status: DC
  Administered 2015-05-17 – 2015-05-19 (×3): 75 mg via ORAL
  Filled 2015-05-17 (×3): qty 1

## 2015-05-17 MED ORDER — HEPARIN BOLUS VIA INFUSION
1500.0000 [IU] | Freq: Once | INTRAVENOUS | Status: AC
  Administered 2015-05-17: 1500 [IU] via INTRAVENOUS
  Filled 2015-05-17: qty 1500

## 2015-05-17 MED ORDER — ISOSORBIDE MONONITRATE ER 30 MG PO TB24
30.0000 mg | ORAL_TABLET | Freq: Every day | ORAL | Status: DC
  Administered 2015-05-17 – 2015-05-19 (×3): 30 mg via ORAL
  Filled 2015-05-17 (×3): qty 1

## 2015-05-17 MED ORDER — METOPROLOL TARTRATE 1 MG/ML IV SOLN
INTRAVENOUS | Status: AC
  Filled 2015-05-17: qty 5

## 2015-05-17 MED ORDER — HYDROMORPHONE HCL 1 MG/ML IJ SOLN
2.0000 mg | INTRAMUSCULAR | Status: DC | PRN
  Administered 2015-05-17 – 2015-05-18 (×8): 2 mg via INTRAVENOUS
  Filled 2015-05-17 (×8): qty 2

## 2015-05-17 MED ORDER — TRAMADOL HCL 50 MG PO TABS
50.0000 mg | ORAL_TABLET | Freq: Four times a day (QID) | ORAL | Status: DC | PRN
  Administered 2015-05-17 – 2015-05-18 (×2): 50 mg via ORAL
  Filled 2015-05-17 (×2): qty 1

## 2015-05-17 MED ORDER — NITROGLYCERIN 0.4 MG SL SUBL
SUBLINGUAL_TABLET | SUBLINGUAL | Status: AC
  Filled 2015-05-17: qty 2

## 2015-05-17 NOTE — Progress Notes (Signed)
Called to see by RN- pt developed "10/10" chest pain after standing for orthostatic vital signs. She has mid sternal pain with palpation. She says Toradol did not help at all. On exam no murmur or rub. EKG without changes.   Corine ShelterLUKE Darryll Raju PA-C 05/17/2015 10:35 AM

## 2015-05-17 NOTE — Progress Notes (Signed)
ANTICOAGULATION CONSULT NOTE - Follow Up Consult  Pharmacy Consult for Heparin Indication: chest pain/ACS  Patient Measurements: Height: 5' (152.4 cm) Weight: 101 lb 11.2 oz (46.131 kg) IBW/kg (Calculated) : 45.5 Labs:  Recent Labs  05/16/15 1721 05/17/15 0107 05/17/15 0630 05/17/15 0845 05/17/15 1310 05/17/15 1914  HGB 13.2  --   --   --   --   --   HCT 38.5  --   --   --   --   --   PLT 359  --   --   --   --   --   HEPARINUNFRC  --   --   --  0.17*  --  0.25*  CREATININE 0.47  --  0.40*  --   --   --   TROPONINI  --  <0.03 <0.03  --  <0.03  --     Assessment: 24 yo F presents with chest pain, pharmacy consulted to start heparin. h/o STEMI. Troponin negative x 2, D dimer negative. EKG shows T wave inversions which is new compared to June EKG.  First HL low at 0.17. CBC stable, no s/s of bleed. 2nd HL 0.25 on 750 units/hr.  Goal of Therapy:  Heparin level 0.3-0.7 units/ml Monitor platelets by anticoagulation protocol: Yes   Plan:  Give 750 unit heparin BOLUS Increase heparin gtt to 850 units/hr Check 6 hr HL Monitor daily HL, CBC, s/s of bleed  Pollyann SamplesAndy Cartier Washko, PharmD, BCPS 05/17/2015, 8:48 PM Pager: [phone]

## 2015-05-17 NOTE — Progress Notes (Signed)
UR Completed Ellora Varnum Graves-Bigelow, RN,BSN [phone]  

## 2015-05-17 NOTE — Progress Notes (Signed)
ANTICOAGULATION CONSULT NOTE - Initial Consult  Pharmacy Consult for Heparin Indication: chest pain/ACS  Allergies  Allergen Reactions  . Garlic Anaphylaxis  . Penicillins Anaphylaxis and Nausea And Vomiting  . Carrot Flavor Hives  . Fentanyl Hives    Patient Measurements: Height: 5' (152.4 cm) Weight: 110 lb (49.896 kg) IBW/kg (Calculated) : 45.5  Vital Signs: Temp: 97.9 F (36.6 C) (11/28 1707) Temp Source: Oral (11/28 1707) BP: 108/63 mmHg (11/28 2100) Pulse Rate: 75 (11/28 2100)  Labs:  Recent Labs  05/16/15 1721  HGB 13.2  HCT 38.5  PLT 359  CREATININE 0.47    Estimated Creatinine Clearance: 76.5 mL/min (by C-G formula based on Cr of 0.47).   Medical History: Past Medical History  Diagnosis Date  . Ectopic pregnancy 11/2010    Right salpingectomy  . Umbilical hernia   . Infection     chlamydia  . Heart attack (HCC) 11/16/14    Medications:  Prescriptions prior to admission  Medication Sig Dispense Refill Last Dose  . aspirin EC 81 MG EC tablet Take 1 tablet (81 mg total) by mouth daily.   05/16/2015 at Unknown time  . clopidogrel (PLAVIX) 75 MG tablet Take 1 tablet (75 mg total) by mouth daily with breakfast. 30 tablet 11 05/16/2015 at Unknown time  . isosorbide mononitrate (IMDUR) 30 MG 24 hr tablet Take 1 tablet (30 mg total) by mouth daily. 30 tablet 11 05/16/2015 at Unknown time  . Metoprolol Tartrate 37.5 MG TABS Take 37.5 mg by mouth 2 (two) times daily. 90 tablet 11 05/16/2015 at 0900  . nitroGLYCERIN (NITROSTAT) 0.4 MG SL tablet Place 1 tablet (0.4 mg total) under the tongue every 5 (five) minutes x 3 doses as needed for chest pain. 25 tablet 12 05/16/2015 at Unknown time  . ranitidine (ZANTAC) 150 MG tablet Take 1 tablet (150 mg total) by mouth 2 (two) times daily. 60 tablet 3 05/16/2015 at Unknown time    Assessment: 24 y.o. female with chest pain, EKG changes for heparin Goal of Therapy:  Heparin level 0.3-0.7 units/ml Monitor platelets  by anticoagulation protocol: Yes   Plan:  Heparin 3000 units IV bolus, then start heparin 600 units/hr Check heparin level in 6 hours.   Cayleigh Paull, Gary FleetGregory Vernon 05/17/2015,12:41 AM

## 2015-05-17 NOTE — Care Management Note (Addendum)
Case Management Note  Patient Details  Name: Jenny HillockLatrice Ardis MRN: [id number] Date of Birth: 12/26/1990  Subjective/Objective:   24 yo female who suffered a post-partum STEMI secondary to LAD dissection in June 2016. She did not undergo intervention and was briefly reviewed for bypass, but medical therapy was recommended. On 05/16/15 she noted intermittent chest pain that did not spontaneously resolve. She went to pick up her husband and apparently passed out for 15 minutes. She took nitroglycerin, but did not have benefit. She says the pain goes to her left arm which is heavy. Troponin negative x 2. EKG shows inferior T wave inversions, which is new compared with June EKG (ST elevation then).  Plan for cardiac CT.                   Action/Plan: CM will continue to monitor for any disposition needs.   Expected Discharge Date:                  Expected Discharge Plan:  Home/Self Care  In-House Referral:  NA  Discharge planning Services  CM Consult  Post Acute Care Choice:    Choice offered to:     DME Arranged:    DME Agency:     HH Arranged:    HH Agency:     Status of Service:  In process, will continue to follow  Medicare Important Message Given:    Date Medicare IM Given:    Medicare IM give by:    Date Additional Medicare IM Given:    Additional Medicare Important Message give by:     If discussed at Long Length of Stay Meetings, dates discussed:    Additional Comments:[id number] Tomi BambergerBrenda Graves-Bigelow, RN BSN Case Manager CM did call [facility] on [address] and Colchicine is available. [facility] and Medication is a approved med. CM will call pharmacy back just to verify cost once Rx is received. No further needs from CM at this time Gala LewandowskyGraves-Bigelow, Kennard Fildes Kaye, RN 05/17/2015, 12:03 PM

## 2015-05-17 NOTE — Progress Notes (Signed)
Subjective:  Pt still complaining of chest pain, some radiation to her Lt arm, NTG does not help. She did admit to a pleuritic component.   Objective:  Vital Signs in the last 24 hours: Temp:  [97.7 F (36.5 C)-99.1 F (37.3 C)] 97.7 F (36.5 C) (11/29 0751) Pulse Rate:  [69-91] 83 (11/29 0522) Resp:  [11-23] 14 (11/29 0522) BP: (105-125)/(46-89) 106/56 mmHg (11/29 0522) SpO2:  [97 %-100 %] 98 % (11/29 0522) Weight:  [101 lb 11.2 oz (46.131 kg)-110 lb (49.896 kg)] 101 lb 11.2 oz (46.131 kg) (11/29 0451)  Intake/Output from previous day:  Intake/Output Summary (Last 24 hours) at 05/17/15 0853 Last data filed at 05/17/15 0831  Gross per 24 hour  Intake    240 ml  Output   1300 ml  Net  -1060 ml   Physical Exam: General appearance: alert, cooperative, no distress and thin Neck: no JVD Lungs: clear to auscultation bilaterally Heart: regular rate and rhythm, S1, S2 normal, no murmur, click, rub or gallop Extremities: extremities normal, atraumatic, no cyanosis or edema Skin: Skin color, texture, turgor normal. No rashes or lesions Neurologic: Grossly normal   Rate: 80  Rhythm: normal sinus rhythm  Lab Results:  Recent Labs  05/16/15 1721  WBC 7.8  HGB 13.2  PLT 359    Recent Labs  05/16/15 1721 05/17/15 0630  NA 129* 136  K 3.4* 3.7  CL 101 108  CO2 18* 19*  GLUCOSE 76 86  BUN 8 <5*  CREATININE 0.47 0.40*    Recent Labs  05/17/15 0107 05/17/15 0630  TROPONINI <0.03 <0.03   No results for input(s): INR in the last 72 hours.  Scheduled Meds: . aspirin EC  81 mg Oral Daily  . clopidogrel  75 mg Oral Q breakfast  . famotidine  20 mg Oral Daily  . isosorbide mononitrate  30 mg Oral Daily  . metoprolol tartrate  37.5 mg Oral BID  . prenatal multivitamin  1 tablet Oral Daily   Continuous Infusions: . heparin 600 Units/hr (05/17/15 0056)   PRN Meds:.acetaminophen, diphenhydrAMINE, HYDROmorphone (DILAUDID) injection, nitroGLYCERIN, ondansetron  (ZOFRAN) IV   Imaging: Imaging results have been reviewed  Cardiac Studies: Echo 11/20/14 Study Conclusions  - Left ventricle: The cavity size was normal. Systolic function was mildly reduced. The estimated ejection fraction was in the range of 45% to 50%. There is hypokinesis of the basalinferior myocardium. Doppler parameters are consistent with abnormal left ventricular relaxation (grade 1 diastolic dysfunction). - Mitral valve: There was mild regurgitation.  Impressions:  - EF is minimally reduced from prior.  Assessment/Plan:   24 yo female who suffered a post-partum STEMI secondary to LAD dissection in June 2016. She did not undergo intervention and was briefly reviewed for bypass, but medical therapy was recommended. On 05/16/15 she noted intermittent chest pain that did not spontaneously resolve. She went to pick up her husband and apparently passed out for 15 minutes. She took nitroglycerin, but did not have benefit. She says the pain goes to her left arm which is heavy. Troponin negative x 2. EKG shows inferior T wave inversions, which is new compared with June EKG (ST elevation then).   Principal Problem:   Chest pain Active Problems:   Syncope   Post partum STEMI secondary to LAD dissection 11/17/14   History of gastroesophageal reflux (GERD)   PLAN: I will try Toradol this am for pain as she has a pleuritic component. (D dimer was negative).  Her history  of syncope could be explained by orthostatic hypotension, will document orthostatic B/P after she takes her am medications.  Continue to monitor, consider decreasing Metoprolol to 25 mg BID if her B/P or HR is low.   Jenny ShelterLuke Kilroy PA-C 05/17/2015, 8:53 AM [phone]  The patient was seen, examined and discussed with Jenny ShelterLuke Kilroy, PA-C and I agree with the above.   A 24 year old patient post spontaneous peripartum coronary dissection --> STEMI in June 2016 with no intervention, on medical therapy. She didn't  follow with cardiology but did well until August when she developed intermittent stabbing chest pain with radiation to her left arm, worse with exertion, but not improving at rest, worse with deep inspiration, not worse in horizontal position. No improvement with NTG. Ecg in June showed inferior ST elevation, now negative T waves in the inferior leads suggestive of inferior ischemia. I have discussed this case with Dr Andrey CotaStukey who is a SCAD expert, he recommends to perform coronary CT rather than a cath that is associated with possible risk of dissection. We will schedule for today, I will give her PO metoprolol to slow down HR.   Jenny Myers, Jenny Myers 05/17/2015

## 2015-05-17 NOTE — Progress Notes (Signed)
ANTICOAGULATION CONSULT NOTE - Follow Up Consult  Pharmacy Consult for Heparin Indication: chest pain/ACS  Allergies  Allergen Reactions  . Garlic Anaphylaxis  . Penicillins Anaphylaxis and Nausea And Vomiting  . Carrot Flavor Hives  . Fentanyl Hives    Patient Measurements: Height: 5' (152.4 cm) Weight: 101 lb 11.2 oz (46.131 kg) IBW/kg (Calculated) : 45.5  Vital Signs: Temp: 97.7 F (36.5 C) (11/29 0751) Temp Source: Oral (11/29 0751) BP: 106/56 mmHg (11/29 0522) Pulse Rate: 83 (11/29 0522)  Labs:  Recent Labs  05/16/15 1721 05/17/15 0107 05/17/15 0630 05/17/15 0845  HGB 13.2  --   --   --   HCT 38.5  --   --   --   PLT 359  --   --   --   HEPARINUNFRC  --   --   --  0.17*  CREATININE 0.47  --  0.40*  --   TROPONINI  --  <0.03 <0.03  --     Estimated Creatinine Clearance: 76.5 mL/min (by C-G formula based on Cr of 0.4).   Assessment: 24 yo F presents with chest pain, pharmacy consulted to start heparin. h/o STEMI. Troponin negative x 2, D dimer negative. EKG shows T wave inversions which is new compared to June EKG.  First HL low at 0.17. CBC stable, no s/s of bleed.  Goal of Therapy:  Heparin level 0.3-0.7 units/ml Monitor platelets by anticoagulation protocol: Yes   Plan:  Give 1,500 unit heparin BOLUS Increase heparin gtt to 750 units/hr Check 6 hr HL Monitor daily HL, CBC, s/s of bleed  Enzo BiNathan Taris Galindo, PharmD, [facility] ClinicBCPS Clinical Pharmacist Pager [phone] 05/17/2015 9:43 AM

## 2015-05-17 NOTE — Progress Notes (Signed)
Paged the on call cardiologist because the client started itching all over about an hour and a half after getting 2mg  IV dilaudid. Received orders for Diphenhydramine 25mg  po for itching and a urine rapid drug screen from Dr. Sherren KernsAmbrosy. Will continue to monitor the client closely.

## 2015-05-17 NOTE — Progress Notes (Signed)
Pt complaint of 10/10 severe CP while standing for 3 minutes for orthostatic VS.  VSS, IV Toradol given and MD notified.  Will continue to closely monitor.

## 2015-05-18 DIAGNOSIS — I2542 Coronary artery dissection: Secondary | ICD-10-CM

## 2015-05-18 DIAGNOSIS — I309 Acute pericarditis, unspecified: Principal | ICD-10-CM

## 2015-05-18 LAB — CBC
HCT: 36.2 % (ref 36.0–46.0)
Hemoglobin: 12.2 g/dL (ref 12.0–15.0)
MCH: 30 pg (ref 26.0–34.0)
MCHC: 33.7 g/dL (ref 30.0–36.0)
MCV: 89.2 fL (ref 78.0–100.0)
PLATELETS: 357 10*3/uL (ref 150–400)
RBC: 4.06 MIL/uL (ref 3.87–5.11)
RDW: 13.3 % (ref 11.5–15.5)
WBC: 7.8 10*3/uL (ref 4.0–10.5)

## 2015-05-18 LAB — HEPARIN LEVEL (UNFRACTIONATED)
Heparin Unfractionated: 0.61 IU/mL (ref 0.30–0.70)
Heparin Unfractionated: 0.55 IU/mL (ref 0.30–0.70)

## 2015-05-18 MED ORDER — KETOROLAC TROMETHAMINE 30 MG/ML IJ SOLN
30.0000 mg | Freq: Four times a day (QID) | INTRAMUSCULAR | Status: DC
  Administered 2015-05-18 – 2015-05-19 (×5): 30 mg via INTRAVENOUS
  Filled 2015-05-18 (×5): qty 1

## 2015-05-18 MED ORDER — METOPROLOL TARTRATE 12.5 MG HALF TABLET
12.5000 mg | ORAL_TABLET | Freq: Two times a day (BID) | ORAL | Status: DC
  Administered 2015-05-18 – 2015-05-19 (×2): 12.5 mg via ORAL
  Filled 2015-05-18 (×2): qty 1

## 2015-05-18 NOTE — Progress Notes (Addendum)
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 EF 45-50% w/ inf WMA. Admitted 11/28 w/ stabbing chest pain. Ez neg MI, Cardiac CT w/ no new dissection.  SUBJECTIVE: Chest pain is severe this am, worse w/ deep inspiration, point tenderness on chest   OBJECTIVE Filed Vitals:   05/17/15 1600 05/17/15 2000 05/18/15 0000 05/18/15 0400  BP:  110/75 110/79 102/51  Pulse:  63 98 105  Temp: 98.5 F (36.9 C) 98.2 F (36.8 C) 97.7 F (36.5 C) 98.4 F (36.9 C)  TempSrc: Oral Oral Oral Oral  Resp:  Height:      Weight:    98 lb 11.2 oz (44.77 kg)  SpO2:  100% 100% 100%    Intake/Output Summary (Last 24 hours) at 05/18/15 0839 Last data filed at 05/18/15 2956  Gross per 24 hour  Intake 2115.86 ml  Output   1600 ml  Net 515.86 ml   Filed Weights   05/17/15 0053 05/17/15 0451 05/18/15 0400  Weight: 102 lb 14.4 oz (46.675 kg) 101 lb 11.2 oz (46.131 kg) 98 lb 11.2 oz (44.77 kg)    PHYSICAL EXAM General: Well developed, well nourished, female in no acute distress. Head: Normocephalic, atraumatic.  Neck: Supple without bruits, JVD not elevated. Lungs:  Resp regular and unlabored, CTA. Heart: RRR, S1, S2, no S3, S4, 2/6 murmur; no rub. Abdomen: Soft, non-tender, non-distended, BS + x 4.  Extremities: No clubbing, cyanosis, edema.  Neuro: Alert and oriented X 3. Moves all extremities spontaneously. Psych: Normal affect.  LABS: CBC: Recent Labs  05/16/15 1721 05/18/15 0350  WBC 7.8 7.8  HGB 13.2 12.2  HCT 38.5 36.2  MCV 88.3 89.2  PLT 359 357   Basic Metabolic Panel: Recent Labs  05/16/15 1721 05/17/15 0630  NA 129* 136  K 3.4* 3.7  CL  101 108  CO2 18* 19*  GLUCOSE 76 86  BUN 8 <5*  CREATININE 0.47 0.40*  CALCIUM 8.8* 9.0   Cardiac Enzymes: Recent Labs  05/17/15 0107 05/17/15 0630 05/17/15 1310  TROPONINI <0.03 <0.03 <0.03    Recent Labs  05/16/15 1729  TROPIPOC 0.00   D-dimer: Recent Labs  05/16/15 1851  DDIMER 0.30   Fasting Lipid Panel: Recent Labs  05/17/15 0107  CHOL 183  HDL 45  LDLCALC 117*  TRIG 106  CHOLHDL 4.1   Thyroid Function Tests: Recent Labs  05/17/15 0107  TSH 2.012   TELE: SR, S brady, into the high 40s at times.        ECG: 11/30 S brady, rate 56 Inferior T wave inversions, new from June 2016  Radiology/Studies: Dg Chest 2 View 05/16/2015  CLINICAL DATA:  24 year old female with acute chest pain and shortness of breath today. EXAM: CHEST  2 VIEW COMPARISON:  11/16/2014 and 04/21/2013 chest radiographs FINDINGS: The cardiomediastinal silhouette is unremarkable. There is no evidence of focal airspace disease, pulmonary edema, suspicious pulmonary nodule/mass, pleural effusion, or pneumothorax. No acute bony abnormalities are identified. IMPRESSION: No active cardiopulmonary disease. Electronically Signed   By: Harmon Pier M.D.   On: 05/16/2015 18:42   Ct Coronary Morp W/cta Cor Teressa Senter W/ca  W/cm &/or Wo/cm 05/17/2015  ADDENDUM REPORT: 05/17/2015 16:50 ADDENDUM: The proximal LAD appears ectatic with luminal mild narrowings possibly a healed dissection. However there is wide open Jenny and no obvious dissection. Electronically Signed   By: Tobias AlexanderKatarina  Minervia Osso   On: 05/17/2015 16:50 05/17/2015  ADDENDUM REPORT: 05/17/2015 16:32 ADDENDUM: Limited view of the lung parenchyma is unremarkable. Limited view of the extracardiac mediastinal tissue is unremarkable. Limited view of the upper abdomen unremarkable. Limited view of the skeleton is unremarkable. No significant extracardiac findings on limited exam of the chest Electronically Signed   By: Genevive BiStewart  Edmunds M.D.   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 EXAM: Cardiac/Coronary  CT TECHNIQUE: The patient was scanned on a Philips 256 scanner. FINDINGS: A 120 kV prospective scan was triggered in the descending thoracic aorta at 111 HU's. Axial non-contrast 3 mm slices were carried out through the heart. The data set was analyzed on a dedicated work station and scored using the Agatson method. Gantry rotation speed was 270 msecs and collimation was .9 mm. 50 mg of PO Metoprolol, 5 mg of iv Metoprolol and 0.4 mg of sl NTG was given. The 3D data set was reconstructed in 5% intervals of the 67-82 % of the R-R cycle. Diastolic phases were analyzed on a dedicated work station using MPR, MIP and VRT modes. The patient received 80 cc of contrast. Aorta: Normal caliber. No calcifications, no dissection. There is a separate origin of the right subclavian and right coronary arteries from the aortic arch. Aortic Valve:  Trileaflet, no calcifications. Coronary Arteries:  Normal origin, right dominance. RCA is a medium caliber dominant vessel that gives rise to acute marginal branch, PDA and PLA, there is no plaque. Left main is a very large caliber vessel with no plaque. LAD is a very large caliber vessel that gives rise to one small septal branch and two small diagonal branches. There are minimal irregularities in the proximal portion, no plaque. LCX artery is a medium size non-dominant vessel that gives rise to two obtuse marginal branches that have no plaque. IMPRESSION: 1. Coronary calcium score of 0. This was 0 percentile for age and sex matched control. 2. Normal coronary origin with right dominance. 3. No evidence of CAD or coronary dissection. Minimal luminal irregularities are seen in the  proximal LAD without associated plaque. Tobias AlexanderKatarina Kaven Cumbie Electronically Signed: By: Tobias AlexanderKatarina  Adeola Dennen On: 05/17/2015 16:19     Current Medications:  . aspirin EC  81 mg Oral Daily  . clopidogrel  75 mg Oral Q breakfast  . colchicine  0.6 mg Oral BID  . famotidine  20 mg Oral Daily  . indomethacin  25 mg Oral BID WC  . isosorbide mononitrate  30 mg Oral Daily  . metoprolol tartrate  37.5 mg Oral BID  . prenatal multivitamin  1 tablet Oral Daily   . 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    History of gastroesophageal reflux (GERD) -  increase Pepcid to BID w/ NSAIDs    Syncope - orthostatic VS neg 11/29    LV dysfunction by echo 11/2014 - recheck, look for pericardial changes - MD advise if cardiac MRI would be helpful  Signed, Leanna Battles 8:39 AM 05/18/2015   The patient was seen, examined and discussed with Theodore Demark, PA-C and I agree with the above.   A 24 year old patient post spontaneous peripartum coronary dissection --> STEMI in June 2016 with no intervention, on medical therapy. She didn't follow with cardiology but did well until August when she developed intermittent stabbing chest pain with radiation to her left arm, worse with exertion, but not improving at rest, worse with deep inspiration, not worse in horizontal position. No improvement with NTG. Ecg in June showed inferior ST elevation, now negative T waves in the inferior leads suggestive of inferior ischemia.  Coronary CT from yesterday shows ectatic proximal LAD, but no plaque in any of the vessels and great flow in all three territories.  Cardiac MRI show LVEF 45% and  3 areas of transmural late gadolinium  enhancement including mid to distal LAD, distal and mid inferior wall and mid and basal inferolateral walls suggestive of prior MI in the mid and distal LAD but also in the LCX territory. I have reviewed prior cath films with Dr Riley Kill that show clear dissection in the mid t distal LAD with thrombus, occluded distal LAD, aneurysmal proximal LAD and stenosis in the proximal LCX artery overlooked before. 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 Vasculitis? There is also acute pericarditis on cardiac MRI around the apical and RV wall.   We will treat for pericarditis, no improvement in symptoms yet. Possible discharge tomorrow.   Lars Masson 05/18/2015

## 2015-05-18 NOTE — Progress Notes (Addendum)
ANTICOAGULATION CONSULT NOTE - Follow Up Consult  Pharmacy Consult for Heparin Indication: chest pain/ACS  Allergies  Allergen Reactions  . Garlic Anaphylaxis  . Penicillins Anaphylaxis and Nausea And Vomiting  . Carrot Flavor Hives  . Fentanyl Hives    Patient Measurements: Height: 5' (152.4 cm) Weight: 98 lb 11.2 oz (44.77 kg) IBW/kg (Calculated) : 45.5 Heparin Dosing Weight: 44 kg  Vital Signs: Temp: 98.4 F (36.9 C) (11/30 0400) Temp Source: Oral (11/30 0400) BP: 102/51 mmHg (11/30 0400) Pulse Rate: 105 (11/30 0400)  Labs:  Recent Labs  05/16/15 1721 05/17/15 0107 05/17/15 0630 05/17/15 0845 05/17/15 1310 05/17/15 1914 05/18/15 0350  HGB 13.2  --   --   --   --   --  12.2  HCT 38.5  --   --   --   --   --  36.2  PLT 359  --   --   --   --   --  357  HEPARINUNFRC  --   --   --  0.17*  --  0.25* 0.61  CREATININE 0.47  --  0.40*  --   --   --   --   TROPONINI  --  <0.03 <0.03  --  <0.03  --   --     Estimated Creatinine Clearance: 75.4 mL/min (by C-G formula based on Cr of 0.4).   Medications:  Scheduled:  . aspirin EC  81 mg Oral Daily  . clopidogrel  75 mg Oral Q breakfast  . colchicine  0.6 mg Oral BID  . famotidine  20 mg Oral Daily  . isosorbide mononitrate  30 mg Oral Daily  . ketorolac  30 mg Intravenous 4 times per day  . metoprolol tartrate  37.5 mg Oral BID  . prenatal multivitamin  1 tablet Oral Daily   Infusions:  . heparin 850 Units/hr (05/17/15 2045)    Assessment: 24 yo F on heparin for CP.  Pt has hx of post-partum STEMI secondary to LAD dissection (June 2016).  Cardiac CT shows this is healed and no new dissection.  Pt continues on therapeutic heparin at 850 units/hr.  Goal of Therapy:  Heparin level 0.3-0.7 units/ml Monitor platelets by anticoagulation protocol: Yes   Plan:  Continue heparin at 850 units/hr Repeat heparin level at noon today for confirmation Daily heparin level and CBC while on heparin  Toys 'R' UsKimberly Ainhoa Rallo,  Pharm.D., BCPS Clinical Pharmacist Pager [phone] 05/18/2015 10:33 AM   Addendum: Heparin level drawn at noon today for confirmation remains therapeutic.  Continue heparin at 850 units/hr.  Follow-up with next heparin level in AM.  Dixie DialsKimberly Semaja Lymon, Pharm.D., BCPS Clinical Pharmacist Pager [phone] 05/18/2015 3:52 PM

## 2015-05-18 NOTE — Clinical Documentation Improvement (Signed)
Cardiology  Can the diagnosis of Chest Pain be further specified? Please specify whether the Chest Pain or Genella RifeGerd is the Principal Diagnosis.   Type:  Accelerated, Unstable, New Onset, Crescendo, Prinzmetal  Other  Clinically Undetermined  Document any associated diagnoses/conditions.   Supporting Information: Patient presents with chest pain per 11/28 progress notes. Patient has a history of gastroesophageal reflux-increase pepcid to BID.   Please exercise your independent, professional judgment when responding. A specific answer is not anticipated or expected.   Thank Sabino DonovanYou,  Airiana Elman [facility] [employer] [facility] [place] [phone]

## 2015-05-19 ENCOUNTER — Other Ambulatory Visit (HOSPITAL_COMMUNITY): Payer: Medicaid Other

## 2015-05-19 DIAGNOSIS — I309 Acute pericarditis, unspecified: Secondary | ICD-10-CM

## 2015-05-19 DIAGNOSIS — R0781 Pleurodynia: Secondary | ICD-10-CM

## 2015-05-19 DIAGNOSIS — I319 Disease of pericardium, unspecified: Secondary | ICD-10-CM

## 2015-05-19 HISTORY — DX: Disease of pericardium, unspecified: I31.9

## 2015-05-19 LAB — HEPARIN LEVEL (UNFRACTIONATED)

## 2015-05-19 LAB — CBC
HEMATOCRIT: 35.2 % — AB (ref 36.0–46.0)
HEMOGLOBIN: 12 g/dL (ref 12.0–15.0)
MCH: 30.5 pg (ref 26.0–34.0)
MCHC: 34.1 g/dL (ref 30.0–36.0)
MCV: 89.3 fL (ref 78.0–100.0)
Platelets: 344 10*3/uL (ref 150–400)
RBC: 3.94 MIL/uL (ref 3.87–5.11)
RDW: 13.3 % (ref 11.5–15.5)
WBC: 7.1 10*3/uL (ref 4.0–10.5)

## 2015-05-19 MED ORDER — METOPROLOL TARTRATE 25 MG PO TABS: 25.0000 mg | ORAL_TABLET | Freq: Two times a day (BID) | ORAL | Status: DC

## 2015-05-19 MED ORDER — INDOMETHACIN 25 MG PO CAPS: 25.0000 mg | ORAL_CAPSULE | Freq: Two times a day (BID) | ORAL | Status: AC

## 2015-05-19 MED ORDER — LISINOPRIL 2.5 MG PO TABS: 2.5000 mg | ORAL_TABLET | Freq: Every day | ORAL | Status: DC

## 2015-05-19 MED ORDER — COLCHICINE 0.6 MG PO TABS: 0.6000 mg | ORAL_TABLET | Freq: Two times a day (BID) | ORAL | Status: DC

## 2015-05-19 NOTE — Discharge Summary (Signed)
Discharge Summary   Patient ID: Jenny Myers MRN: [id number], DOB/AGE: 13-Mar-1991 24 y.o. Admit date: 05/16/2015 D/C date:     05/19/2015  Primary Cardiologist: Dr. 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 female with a history of spont LAD dissection after vag delivery 10/2014, med rx. EF 45-50% w/ inf WMA. Admitted 11/28 w/ stabbing chest pain. Ez neg MI, Cardiac CT w/ no new dissection.   She has a history of STEMI after vaginal delivery (11/17/14) 2/2 spontaneous coronary dissection of the LAD. The cath films were reviewed by several cardiologists with the plan for medical therapy. Troponin >65. Echo revealed EF of 45-50% with hypokinesis of the basal inferiormyocardium, G1DD. Aggrastat and heparin drip were stopped and she was brought back to the lab for IVUS. She did not undergo intervention and was briefly reviewed for bypass, but medical therapy was recommended as she had poor surgical targets. Her chest pain eventually resolved.   She was doing well until the morning of 05/16/15 she noted intermittent chest pain that did not spontaneously resolve. She went to pick up her husband and apparently passed out for 15 minutes. She took nitroglycerin, but did not have benefit. She says the pain goes to her left arm which is heavy. Troponin on admission was negative. EKG showed inferior T wave inversions, which was new.   She was placed on IV heparin and admitted for further observation. Coronary CT from 05/17/15 showed ectatic proximal LAD, but no plaque in any of the vessels and great flow in all three territories. Cardiac MRI showed LVEF 45% and 3 areas of transmural late  gadolinium enhancement including mid to distal LAD, distal and mid inferior wall and mid and basal inferolateral walls suggestive of prior MI in the mid and distal LAD but also in the LCX territory. Dr. Delton See reviewed prior cath films with Dr Riley Kill that show clear dissection in the mid distal LAD with thrombus, occluded distal LAD, aneurysmal proximal LAD and stenosis in the proximal LCX artery overlooked before. 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 Vasculitis? There was also acute pericarditis on cardiac MRI around the apical and RV wall, which was felt to be the cause of her chest pain. She was started on Indomethacin 25 mg po TID x 2 weeks and colchicine 0.6 mg po BID x 3 months.  She was not placed on an ACE/ARB after discharge in June because she was breastfeeding. She is no longer breastfeeding so we lowered her BB ( Lopressor 37.5mg  BID--> 25mg  BID) and added low dose ACEi (lisinopril 2.5mg  po qd). She was discharged on 05/19/15.  Hospital Course  Chest pain/Acute pericarditis- felt to be 2/2 acute pericarditis as seen on cardiac MR.  -- Cardiac CT neg for dissection, ez neg for MI -- Continue Indomethacin 25 mg po TID x 2 weeks and colchicine 0.6 mg po BID x 3 months.  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  -- Continue ASA/plavix, BB.  No statin was placed since she was breastfeeding. She does not have plaque on cardiac CT. Will hold off for now. She is no longer breastfeeding so we will lower her BB ( Lopressor 37.5mg  BID-->  BID) and add low dose ACEi (lisinopril 2.5mg  po qd)  GERD -- Zantac increased to BID dosing w/ NSAIDs  Syncope -- Orthostatic VS neg 11/29. No arrhythmia noted on tele. Continue to monitor  LV dysfunction  by echo 11/2014 -- EF 45-50%, HK of the basal inferior myocardium, mild MR, G1DD on ECHO 11/20/2014 -- Cardiac MRI this admission showed LVEF 45% and 3 areas of transmural late gadolinium enhancement including mid to distal LAD, distal and mid inferior wall and mid and basal inferolateral walls suggestive of prior MI in the mid and distal LAD but also in the LCX territory. -- Continue BB. She was not placed on an ACE/ARB because she was breastfeeding. She is no longer breastfeeding so we will lower her BB ( Lopressor 37.5mg  BID-->  BID) and add low dose ACEi (lisinopril 2.5mg  po qd). Will recheck a BMET next week in clinic.  The patient has had an uncomplicated hospital course and is recovering well. She has been seen by Dr. Delton See today and deemed ready for discharge home. All follow-up appointments have been scheduled. Discharge medications are listed below.   Discharge Vitals: Blood pressure 112/60, pulse 63, temperature 99 F (37.2 C), temperature source Oral, resp. rate 13, height 5' (1.524 m), weight 99 lb 4.8 oz (45.042 kg), last menstrual period 04/15/2015, SpO2 100 %, currently breastfeeding.  Labs: Lab Results  Component Value Date   WBC 7.1 05/19/2015   HGB 12.0 05/19/2015   HCT 35.2* 05/19/2015   MCV 89.3 05/19/2015   PLT 344 05/19/2015     Recent Labs Lab 05/17/15 0630  NA 136  K 3.7  CL 108  CO2 19*  BUN <5*  CREATININE 0.40*  CALCIUM 9.0  GLUCOSE 86    Recent Labs  05/17/15 0107 05/17/15 0630 05/17/15 1310  TROPONINI <0.03 <0.03 <0.03   Lab Results  Component Value Date   CHOL 183 05/17/2015   HDL 45 05/17/2015   LDLCALC 117* 05/17/2015   TRIG 106 05/17/2015   Lab Results  Component Value Date   DDIMER 0.30 05/16/2015      Diagnostic Studies/Procedures   Dg Chest 2 View  05/16/2015  CLINICAL DATA:  24 year old female with acute chest pain and shortness of breath today. EXAM: CHEST  2 VIEW COMPARISON:  11/16/2014 and 04/21/2013 chest  radiographs FINDINGS: The cardiomediastinal silhouette is unremarkable. There is no evidence of focal airspace disease, pulmonary edema, suspicious pulmonary nodule/mass, pleural effusion, or pneumothorax. No acute bony abnormalities are identified. IMPRESSION: No active cardiopulmonary disease. Electronically Signed   By: Harmon Pier M.D.   On: 05/16/2015 18:42   Ct Coronary Morp W/cta Cor W/score W/ca W/cm &/or Wo/cm  05/17/2015  ADDENDUM REPORT: 05/17/2015 16:50 ADDENDUM: The proximal LAD appears ectatic with luminal mild narrowings possibly a healed dissection. However there is wide open lumen and no obvious dissection. Electronically Signed   By: Tobias Alexander   On: 05/17/2015 16:50  05/17/2015  ADDENDUM REPORT: 05/17/2015 16:32 ADDENDUM: Limited view of the lung parenchyma is unremarkable. Limited view of the extracardiac mediastinal tissue is unremarkable. Limited view of the upper abdomen unremarkable. Limited view of the skeleton is unremarkable. No significant extracardiac findings on limited exam of the chest Electronically Signed   By: Genevive Bi M.D.   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 EXAM: Cardiac/Coronary  CT TECHNIQUE: The patient was scanned on a Philips 256 scanner. FINDINGS: A 120 kV prospective scan was triggered in the descending thoracic aorta at 111 HU's. Axial non-contrast 3 mm slices were carried out through the heart. The data set was analyzed on a dedicated work station and scored using the Agatson method. Gantry rotation speed was 270 msecs and collimation was .9 mm. 50 mg of PO Metoprolol, 5 mg of iv Metoprolol and 0.4 mg of sl NTG was given. The 3D data set was reconstructed in 5% intervals of the 67-82 %  of the R-R cycle. Diastolic phases were analyzed on a dedicated work station using MPR, MIP and VRT modes. The patient received 80 cc of contrast. Aorta: Normal caliber. No calcifications, no dissection. There is a separate origin of the right subclavian and right coronary arteries from the aortic arch. Aortic Valve:  Trileaflet, no calcifications. Coronary Arteries:  Normal origin, right dominance. RCA is a medium caliber dominant vessel that gives rise to acute marginal branch, PDA and PLA, there is no plaque. Left main is a very large caliber vessel with no plaque. LAD is a very large caliber vessel that gives rise to one small septal branch and two small diagonal branches. There are minimal irregularities in the proximal portion, no plaque. LCX artery is a medium size non-dominant vessel that gives rise to two obtuse marginal branches that have no plaque. IMPRESSION: 1. Coronary calcium score of 0. This was 0 percentile for age and sex matched control. 2. Normal coronary origin with right dominance. 3. No evidence of CAD or coronary dissection. Minimal luminal irregularities are seen in the proximal LAD without associated plaque. Tobias Alexander Electronically Signed: By: Tobias Alexander On: 05/17/2015 16:19    11/20/14 echo LV EF: 45% -  50% Study Conclusions - Left ventricle: The cavity size was normal. Systolic function was mildly reduced. The estimated ejection fraction was in the range of 45% to 50%. There is hypokinesis of the basalinferior myocardium. Doppler parameters are consistent with abnormal left ventricular relaxation (grade 1 diastolic dysfunction). - Mitral valve: There was mild regurgitation. Impressions: - EF is minimally reduced from prior.    Discharge Medications     Medication List    TAKE these medications        aspirin 81 MG EC tablet  Take 1 tablet (81 mg total) by mouth daily.     clopidogrel 75 MG tablet  Commonly known as:  PLAVIX  Take 1 tablet  (75 mg total) by mouth daily with breakfast.     colchicine 0.6 MG tablet  Take 1 tablet (0.6 mg total) by mouth 2 (two) times daily.     indomethacin 25 MG capsule  Commonly known as:  INDOCIN  Take 1 capsule (25 mg total) by mouth 2 (two) times daily with a meal.     isosorbide mononitrate 30 MG 24 hr tablet  Commonly known as:  IMDUR  Take 1 tablet (30 mg total) by mouth daily.     lisinopril 2.5 MG tablet  Commonly known as:  ZESTRIL  Take 1 tablet (2.5 mg total) by mouth daily.     metoprolol tartrate 25 MG tablet  Commonly known as:  LOPRESSOR  Take 1 tablet (25 mg total) by mouth 2 (two) times daily.     nitroGLYCERIN 0.4 MG SL tablet  Commonly known as:  NITROSTAT  Place 1 tablet (0.4 mg total) under the tongue every 5 (five) minutes x 3 doses as needed for chest pain.     ranitidine 150 MG tablet  Commonly known as:  ZANTAC  Take 1 tablet (150 mg total) by mouth 2 (two) times daily.        Disposition   The patient will be discharged in stable condition to home.  Follow-up Information    Follow up with Janetta HoraHOMPSON, KATHRYN R, PA-C On 05/25/2015.   Specialties:  Cardiology, Radiology   Why:  @ 8:30 am    Contact information:   [address] [place] [phone]         Duration of Discharge Encounter: Greater than 30 minutes including physician and PA time.  SignedJanee Morn, THOMPSON, KATHRYN R PA-C 05/19/2015, 11:59 AM

## 2015-05-19 NOTE — Progress Notes (Signed)
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 EF 45-50% w/ inf WMA. Admitted 11/28 w/ stabbing chest pain. Ez neg MI, Cardiac CT w/ no new dissection.  SUBJECTIVE: Chest pain is severe this am, worse w/ deep inspiration, point tenderness on chest   OBJECTIVE Filed Vitals:   05/18/15 2328 05/19/15 0000 05/19/15 0342 05/19/15 0849  BP: 115/88  100/52 104/42  Pulse:    57  Temp:  99.1 F (37.3 C) 98.5 F (36.9 C) 99 F (37.2 C)  TempSrc:  Oral Oral Oral  Resp: 15  14   Height:      Weight:   99 lb 4.8 oz (45.042 kg)   SpO2: 96% 100% 97% 100%    Intake/Output Summary (Last 24 hours) at 05/19/15 0906 Last data filed at 05/19/15 0200  Gross per 24 hour  Intake    870 ml  Output    125 ml  Net    745 ml   Filed Weights   05/17/15 0451 05/18/15 0400 05/19/15 0342  Weight: 101 lb 11.2 oz (46.131 kg) 98 lb 11.2 oz (44.77 kg) 99 lb 4.8 oz (45.042 kg)    PHYSICAL EXAM General: Well developed, well nourished, female in no acute distress. Head: Normocephalic, atraumatic.  Neck: Supple without bruits, JVD not elevated. Lungs:  Resp regular and unlabored, CTA. Heart: RRR, S1, S2, no S3, S4, 2/6 murmur; no rub. Abdomen: Soft, non-tender, non-distended, BS + x 4.  Extremities: No clubbing, cyanosis, edema.  Neuro: Alert and oriented X 3. Moves all extremities spontaneously. Psych: Normal affect.  LABS: CBC:  Recent Labs  05/18/15 0350 05/19/15 0338  WBC 7.8 7.1  HGB 12.2 12.0  HCT 36.2 35.2*  MCV 89.2 89.3  PLT 357 344   Basic Metabolic Panel:  Recent Labs  16/10/96 1721 05/17/15 0630  NA 129* 136  K 3.4* 3.7  CL 101 108  CO2  18* 19*  GLUCOSE 76 86  BUN 8 <5*  CREATININE 0.47 0.40*  CALCIUM 8.8* 9.0   Cardiac Enzymes:  Recent Labs  05/17/15 0107 05/17/15 0630 05/17/15 1310  TROPONINI <0.03 <0.03 <0.03    Recent Labs  05/16/15 1729  TROPIPOC 0.00   D-dimer:  Recent Labs  05/16/15 1851  DDIMER 0.30   Fasting Lipid Panel:  Recent Labs  05/17/15 0107  CHOL 183  HDL 45  LDLCALC 117*  TRIG 106  CHOLHDL 4.1   Thyroid Function Tests:  Recent Labs  05/17/15 0107  TSH 2.012   TELE: SR, S brady, into the high 40s at times.        ECG: 11/30 S brady, rate 56 Inferior T wave inversions, new from June 2016  Radiology/Studies: Dg Chest 2 View 05/16/2015  CLINICAL DATA:  24 year old female with acute chest pain and shortness of breath today. EXAM: CHEST  2 VIEW COMPARISON:  11/16/2014 and 04/21/2013 chest radiographs FINDINGS: The cardiomediastinal silhouette is unremarkable. There is no evidence of focal airspace disease, pulmonary edema, suspicious pulmonary nodule/mass, pleural effusion, or pneumothorax. No acute bony abnormalities are identified. IMPRESSION: No active cardiopulmonary disease. Electronically Signed   By: Harmon Pier M.D.   On:  05/16/2015 18:42   Ct Coronary Morp W/cta Cor W/score W/ca W/cm &/or Wo/cm 05/17/2015  ADDENDUM REPORT: 05/17/2015 16:50 ADDENDUM: The proximal LAD appears ectatic with luminal mild narrowings possibly a healed dissection. However there is wide open lumen and no obvious dissection. Electronically Signed   By: Tobias Alexander   On: 05/17/2015 16:50 05/17/2015  ADDENDUM REPORT: 05/17/2015 16:32 ADDENDUM: Limited view of the lung parenchyma is unremarkable. Limited view of the extracardiac mediastinal tissue is unremarkable. Limited view of the upper abdomen unremarkable. Limited view of the skeleton is unremarkable. No significant extracardiac findings on limited exam of the chest Electronically Signed   By: Genevive Bi M.D.   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 EXAM: Cardiac/Coronary  CT TECHNIQUE: The patient was scanned on a Philips 256 scanner. FINDINGS: A 120 kV prospective scan was triggered in the descending thoracic aorta at 111 HU's. Axial non-contrast 3 mm slices were carried out through the heart. The data set was analyzed on a dedicated work station and scored using the Agatson method. Gantry rotation speed was 270 msecs and collimation was .9 mm. 50 mg of PO Metoprolol, 5 mg of iv Metoprolol and 0.4 mg of sl NTG was given. The 3D data set was reconstructed in 5% intervals of the 67-82 % of the R-R cycle. Diastolic phases were analyzed on a dedicated work station using MPR, MIP and VRT modes. The patient received 80 cc of contrast. Aorta: Normal caliber. No calcifications, no dissection. There is a separate origin of the right subclavian and right coronary arteries from the aortic arch. Aortic Valve:  Trileaflet, no calcifications. Coronary Arteries:  Normal origin, right dominance. RCA is a medium caliber dominant vessel that gives rise to acute marginal branch, PDA and PLA, there is no plaque. Left main is a very large caliber vessel with no plaque. LAD is a very large caliber vessel that gives rise to one small septal branch and two small diagonal branches. There are minimal irregularities in the proximal portion, no plaque. LCX artery is a medium size non-dominant vessel that gives rise to two obtuse marginal branches that have no plaque. IMPRESSION: 1. Coronary calcium score of 0. This was 0 percentile for age and sex matched control. 2. Normal coronary origin with right dominance. 3. No evidence of CAD or coronary dissection. Minimal luminal irregularities are seen in the  proximal LAD without associated plaque. Tobias Alexander Electronically Signed: By: Tobias Alexander On: 05/17/2015 16:19   Current Medications:  . aspirin EC  81 mg Oral Daily  . clopidogrel  75 mg Oral Q breakfast  . colchicine  0.6 mg Oral BID  . famotidine  20 mg Oral Daily  . isosorbide mononitrate  30 mg Oral Daily  . ketorolac  30 mg Intravenous 4 times per day  . metoprolol tartrate  12.5 mg Oral BID  . prenatal multivitamin  1 tablet Oral Daily      ASSESSMENT AND PLAN:  A 24 year old patient post spontaneous peripartum coronary dissection --> STEMI in June 2016 with no intervention, on medical therapy. She didn't follow with cardiology but did well until August when she developed intermittent stabbing chest pain with radiation to her left arm, worse with exertion, but not improving at rest,  worse with deep inspiration, not worse in horizontal position. No improvement with NTG. Ecg in June showed inferior ST elevation, now negative T waves in the inferior leads suggestive of inferior ischemia.  Coronary CT from yesterday shows ectatic proximal LAD, but no plaque in any of the vessels and great flow in all three territories.  Cardiac MRI show LVEF 45% and  3 areas of transmural late gadolinium enhancement including mid to distal LAD, distal and mid inferior wall and mid and basal inferolateral walls suggestive of prior MI in the mid and distal LAD but also in the LCX territory. I have reviewed prior cath films with Dr Riley KillStuckey that show clear dissection in the mid t distal LAD with thrombus, occluded distal LAD, aneurysmal proximal LAD and stenosis in the proximal LCX artery overlooked before. 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 Vasculitis?  There is also acute pericarditis on cardiac MRI around the apical and RV wall. This is most probably the cause of her  chest pain. We will treat for pericarditis, she is mildly improved, we will discharge on Indomethacine 25 mg po TID x 2 weeks and colchicine 0.6 mg po BID x 3 months. Follow up in the clinic in 2 weeks. She might need a case manager consult for medication assistance. Discharge today.   Lars MassonELSON, Allani Reber H 05/19/2015

## 2015-05-19 NOTE — Progress Notes (Signed)
Patient discharge summary gone over inn detail with patient. All questions answered to patients satisfaction. All medications gone over in detail with patient with all questions answered. Telemetry discontinued, IV removed intact. Patient discharged to home with family.

## 2015-05-24 NOTE — Progress Notes (Signed)
This encounter was created in error - please disregard.

## 2015-05-25 ENCOUNTER — Encounter: Payer: Medicaid Other | Admitting: Physician Assistant

## 2015-05-25 DIAGNOSIS — R0989 Other specified symptoms and signs involving the circulatory and respiratory systems: Secondary | ICD-10-CM

## 2015-06-01 ENCOUNTER — Encounter: Payer: Self-pay | Admitting: Physician Assistant

## 2015-06-03 ENCOUNTER — Encounter: Payer: Self-pay | Admitting: *Deleted

## 2015-06-25 ENCOUNTER — Inpatient Hospital Stay (HOSPITAL_COMMUNITY)
Admission: EM | Admit: 2015-06-25 | Discharge: 2015-06-28 | DRG: 313 | Disposition: A | Discharge status: Home / Self Care | Payer: Medicaid Other | Attending: Family Medicine | Admitting: Family Medicine

## 2015-06-25 ENCOUNTER — Encounter (HOSPITAL_COMMUNITY): Payer: Self-pay

## 2015-06-25 ENCOUNTER — Emergency Department (HOSPITAL_COMMUNITY): Payer: Medicaid Other

## 2015-06-25 DIAGNOSIS — K0889 Other specified disorders of teeth and supporting structures: Secondary | ICD-10-CM | POA: Insufficient documentation

## 2015-06-25 DIAGNOSIS — Z7982 Long term (current) use of aspirin: Secondary | ICD-10-CM

## 2015-06-25 DIAGNOSIS — Z79899 Other long term (current) drug therapy: Secondary | ICD-10-CM

## 2015-06-25 DIAGNOSIS — K05219 Aggressive periodontitis, localized, unspecified severity: Secondary | ICD-10-CM | POA: Diagnosis present

## 2015-06-25 DIAGNOSIS — I252 Old myocardial infarction: Secondary | ICD-10-CM

## 2015-06-25 DIAGNOSIS — Z7902 Long term (current) use of antithrombotics/antiplatelets: Secondary | ICD-10-CM

## 2015-06-25 DIAGNOSIS — R079 Chest pain, unspecified: Secondary | ICD-10-CM | POA: Insufficient documentation

## 2015-06-25 DIAGNOSIS — Z87891 Personal history of nicotine dependence: Secondary | ICD-10-CM

## 2015-06-25 DIAGNOSIS — R6884 Jaw pain: Secondary | ICD-10-CM

## 2015-06-25 DIAGNOSIS — K047 Periapical abscess without sinus: Secondary | ICD-10-CM | POA: Diagnosis present

## 2015-06-25 DIAGNOSIS — K219 Gastro-esophageal reflux disease without esophagitis: Secondary | ICD-10-CM | POA: Diagnosis present

## 2015-06-25 DIAGNOSIS — R0789 Other chest pain: Principal | ICD-10-CM | POA: Diagnosis present

## 2015-06-25 HISTORY — DX: Disease of pericardium, unspecified: I31.9

## 2015-06-25 HISTORY — DX: Coronary artery dissection: I25.42

## 2015-06-25 LAB — BASIC METABOLIC PANEL
Anion gap: 10 (ref 5–15)
BUN: 13 mg/dL (ref 6–20)
CHLORIDE: 110 mmol/L (ref 101–111)
CO2: 19 mmol/L — AB (ref 22–32)
Calcium: 9.3 mg/dL (ref 8.9–10.3)
Creatinine, Ser: 0.45 mg/dL (ref 0.44–1.00)
GFR calc Af Amer: >60 mL/min (ref >60)
GFR calc non Af Amer: >60 mL/min (ref >60)
GLUCOSE: 86 mg/dL (ref 65–99)
POTASSIUM: 4.1 mmol/L (ref 3.5–5.1)
Sodium: 139 mmol/L (ref 135–145)

## 2015-06-25 LAB — CBC
HEMATOCRIT: 37 % (ref 36.0–46.0)
Hemoglobin: 12.5 g/dL (ref 12.0–15.0)
MCH: 30.1 pg (ref 26.0–34.0)
MCHC: 33.8 g/dL (ref 30.0–36.0)
MCV: 89.2 fL (ref 78.0–100.0)
Platelets: 378 10*3/uL (ref 150–400)
RBC: 4.15 MIL/uL (ref 3.87–5.11)
RDW: 13.2 % (ref 11.5–15.5)
WBC: 10.1 10*3/uL (ref 4.0–10.5)

## 2015-06-25 LAB — I-STAT TROPONIN, ED: Troponin i, poc: 0 ng/mL (ref 0.00–0.08)

## 2015-06-25 NOTE — ED Notes (Signed)
1 hour pta pt began having left side jaw pain radiating down into the left chest. Pt has hx of MI in June of 2016. 12 leads show some variance and some depression. Pt admits to sob, lightheadedness. Pt states that she has been having dental problems on the left side of her jaw but nothing like this. Pt placed on 4L [place] by ems. O2 sat 98% on 4L. BP 135/92, HR 81. Pt able to speak in complete sentences. Pt complains of 10/10 in the left side of her mouth "going all the way down to my chest"

## 2015-06-26 ENCOUNTER — Observation Stay (HOSPITAL_COMMUNITY): Payer: Medicaid Other

## 2015-06-26 ENCOUNTER — Encounter (HOSPITAL_COMMUNITY): Payer: Self-pay | Admitting: *Deleted

## 2015-06-26 DIAGNOSIS — R079 Chest pain, unspecified: Secondary | ICD-10-CM

## 2015-06-26 DIAGNOSIS — K047 Periapical abscess without sinus: Secondary | ICD-10-CM | POA: Diagnosis not present

## 2015-06-26 DIAGNOSIS — I519 Heart disease, unspecified: Secondary | ICD-10-CM

## 2015-06-26 DIAGNOSIS — I319 Disease of pericardium, unspecified: Secondary | ICD-10-CM | POA: Diagnosis not present

## 2015-06-26 DIAGNOSIS — K219 Gastro-esophageal reflux disease without esophagitis: Secondary | ICD-10-CM | POA: Diagnosis not present

## 2015-06-26 DIAGNOSIS — K0889 Other specified disorders of teeth and supporting structures: Secondary | ICD-10-CM | POA: Diagnosis not present

## 2015-06-26 DIAGNOSIS — R6884 Jaw pain: Secondary | ICD-10-CM | POA: Diagnosis not present

## 2015-06-26 LAB — URINALYSIS, ROUTINE W REFLEX MICROSCOPIC
Bilirubin Urine: NEGATIVE
Glucose, UA: NEGATIVE mg/dL
Hgb urine dipstick: NEGATIVE
KETONES UR: NEGATIVE mg/dL
LEUKOCYTES UA: NEGATIVE
NITRITE: NEGATIVE
PH: 5.5 (ref 5.0–8.0)
PROTEIN: NEGATIVE mg/dL
Specific Gravity, Urine: 1.043 — ABNORMAL HIGH (ref 1.005–1.030)

## 2015-06-26 LAB — URINE MICROSCOPIC-ADD ON: RBC / HPF: NONE SEEN RBC/hpf (ref 0–5)

## 2015-06-26 LAB — TROPONIN I
TROPONIN I: 0.03 ng/mL (ref <0.031)
Troponin I: <0.03 ng/mL (ref <0.031)
Troponin I: <0.03 ng/mL (ref <0.031)

## 2015-06-26 LAB — RAPID URINE DRUG SCREEN, HOSP PERFORMED
Amphetamines: NOT DETECTED
BARBITURATES: NOT DETECTED
BENZODIAZEPINES: NOT DETECTED
COCAINE: NOT DETECTED
Opiates: POSITIVE — AB
Tetrahydrocannabinol: NOT DETECTED

## 2015-06-26 MED ORDER — METOPROLOL TARTRATE 12.5 MG HALF TABLET
12.5000 mg | ORAL_TABLET | Freq: Two times a day (BID) | ORAL | Status: DC
  Administered 2015-06-26 – 2015-06-28 (×5): 12.5 mg via ORAL
  Filled 2015-06-26 (×5): qty 1

## 2015-06-26 MED ORDER — ACETAMINOPHEN 325 MG PO TABS
650.0000 mg | ORAL_TABLET | Freq: Four times a day (QID) | ORAL | Status: DC
  Administered 2015-06-26: 650 mg via ORAL
  Filled 2015-06-26: qty 2

## 2015-06-26 MED ORDER — NITROGLYCERIN 0.4 MG SL SUBL
0.4000 mg | SUBLINGUAL_TABLET | SUBLINGUAL | Status: DC | PRN
  Filled 2015-06-26: qty 1

## 2015-06-26 MED ORDER — CLINDAMYCIN PHOSPHATE 600 MG/50ML IV SOLN
600.0000 mg | Freq: Three times a day (TID) | INTRAVENOUS | Status: DC
  Administered 2015-06-26 – 2015-06-27 (×3): 600 mg via INTRAVENOUS
  Filled 2015-06-26 (×4): qty 50

## 2015-06-26 MED ORDER — CLOPIDOGREL BISULFATE 75 MG PO TABS
75.0000 mg | ORAL_TABLET | Freq: Every day | ORAL | Status: DC
  Administered 2015-06-26 – 2015-06-28 (×3): 75 mg via ORAL
  Filled 2015-06-26 (×3): qty 1

## 2015-06-26 MED ORDER — LISINOPRIL 2.5 MG PO TABS
2.5000 mg | ORAL_TABLET | Freq: Every day | ORAL | Status: DC
  Administered 2015-06-27 – 2015-06-28 (×2): 2.5 mg via ORAL
  Filled 2015-06-26 (×2): qty 1

## 2015-06-26 MED ORDER — KETOROLAC TROMETHAMINE 30 MG/ML IJ SOLN
30.0000 mg | Freq: Once | INTRAMUSCULAR | Status: AC
  Administered 2015-06-26: 30 mg via INTRAVENOUS
  Filled 2015-06-26: qty 1

## 2015-06-26 MED ORDER — FAMOTIDINE 20 MG PO TABS
10.0000 mg | ORAL_TABLET | Freq: Every day | ORAL | Status: DC
  Administered 2015-06-26 – 2015-06-28 (×3): 10 mg via ORAL
  Filled 2015-06-26 (×3): qty 1

## 2015-06-26 MED ORDER — IBUPROFEN 600 MG PO TABS
600.0000 mg | ORAL_TABLET | Freq: Four times a day (QID) | ORAL | Status: DC | PRN
  Administered 2015-06-26 (×4): 600 mg via ORAL
  Filled 2015-06-26 (×4): qty 1

## 2015-06-26 MED ORDER — OXYCODONE-ACETAMINOPHEN 5-325 MG PO TABS
1.0000 | ORAL_TABLET | Freq: Three times a day (TID) | ORAL | Status: DC
  Administered 2015-06-26 – 2015-06-28 (×7): 1 via ORAL
  Filled 2015-06-26 (×7): qty 1

## 2015-06-26 MED ORDER — HYDROMORPHONE HCL 1 MG/ML IJ SOLN
1.0000 mg | INTRAMUSCULAR | Status: DC | PRN
  Administered 2015-06-26 – 2015-06-28 (×15): 1 mg via INTRAVENOUS
  Filled 2015-06-26 (×15): qty 1

## 2015-06-26 MED ORDER — MORPHINE SULFATE (PF) 4 MG/ML IV SOLN
4.0000 mg | Freq: Once | INTRAVENOUS | Status: AC
  Administered 2015-06-26: 4 mg via INTRAVENOUS
  Filled 2015-06-26: qty 1

## 2015-06-26 MED ORDER — HYDROMORPHONE HCL 1 MG/ML IJ SOLN
1.0000 mg | INTRAMUSCULAR | Status: DC | PRN
  Administered 2015-06-26: 1 mg via INTRAVENOUS
  Filled 2015-06-26: qty 1

## 2015-06-26 MED ORDER — ISOSORBIDE MONONITRATE ER 30 MG PO TB24
30.0000 mg | ORAL_TABLET | Freq: Every day | ORAL | Status: DC
  Administered 2015-06-26 – 2015-06-28 (×3): 30 mg via ORAL
  Filled 2015-06-26 (×3): qty 1

## 2015-06-26 MED ORDER — ASPIRIN EC 81 MG PO TBEC
81.0000 mg | DELAYED_RELEASE_TABLET | Freq: Every day | ORAL | Status: DC
  Administered 2015-06-26 – 2015-06-28 (×3): 81 mg via ORAL
  Filled 2015-06-26 (×3): qty 1

## 2015-06-26 MED ORDER — NITROGLYCERIN 0.4 MG SL SUBL
0.4000 mg | SUBLINGUAL_TABLET | SUBLINGUAL | Status: AC | PRN
  Administered 2015-06-26 (×3): 0.4 mg via SUBLINGUAL
  Filled 2015-06-26: qty 1

## 2015-06-26 MED ORDER — ACETAMINOPHEN 325 MG PO TABS: 650.0000 mg | ORAL_TABLET | ORAL | Status: DC | PRN

## 2015-06-26 MED ORDER — ENOXAPARIN SODIUM 30 MG/0.3ML ~~LOC~~ SOLN
30.0000 mg | SUBCUTANEOUS | Status: DC
  Filled 2015-06-26 (×2): qty 0.3

## 2015-06-26 MED ORDER — COLCHICINE 0.6 MG PO TABS
0.6000 mg | ORAL_TABLET | Freq: Two times a day (BID) | ORAL | Status: DC
  Administered 2015-06-26 – 2015-06-28 (×5): 0.6 mg via ORAL
  Filled 2015-06-26 (×5): qty 1

## 2015-06-26 MED ORDER — ONDANSETRON HCL 4 MG/2ML IJ SOLN: 4.0000 mg | Freq: Four times a day (QID) | INTRAMUSCULAR | Status: DC | PRN

## 2015-06-26 NOTE — ED Provider Notes (Signed)
CSN: [id number]     Arrival date & time 05/16/15  1700 History   First MD Initiated Contact with Patient 05/16/15 1740     Chief Complaint  Patient presents with  . Chest Pain   HPI   Jenny Myers is a 25 y.o. F PMH significant for STEMI (May 2016), ectopic pregnancy presenting with a substernal CP since this morning. She describes her pain as stabbing, constant, radiating down to her left arm, 10/10 pain, non-exertional, not relieved by 2 SL nitro. She endorses SOB, nausea, medication compliance. No fevers, chills, emesis, changes in bowel/bladder habits.   Past Medical History  Diagnosis Date  . Ectopic pregnancy 11/2010    Right salpingectomy  . Umbilical hernia   . Infection     chlamydia  . Heart attack ([place]) 11/16/14   Past Surgical History  Procedure Laterality Date  . Salpingectomy  2012    left ovary, ruptured ectopic  . Wisdom tooth extraction    . Induced abortion    . Cardiac catheterization N/A 11/17/2014    Procedure: Left Heart Cath and Coronary Angiography;  Surgeon: Troy Sine, MD;  Location: [facility];  Service: Cardiovascular;  Laterality: N/A;  . Cardiac catheterization N/A 11/17/2014    Procedure: Left Heart Cath and Coronary Angiography;  Surgeon: Troy Sine, MD;  Location: [facility];  Service: Cardiovascular;  Laterality: N/A;   Family History  Problem Relation Age of Onset  . Hypertension Mother   . Diabetes Father   . Hypertension Father   . Cancer Father     breast  . Vision loss Paternal Grandmother   . Other Neg Hx    Social History  Substance Use Topics  . Smoking status: Former Research scientist (life sciences)  . Smokeless tobacco: Never Used     Comment: quit with preg  . Alcohol Use: No     Comment: not since pregnancy   OB History    Gravida Para Term Preterm AB TAB SAB Ectopic Multiple Living   '6 3 3 '$ 0 '3 1 1 1 '$ 0 3     Review of Systems  Ten systems are reviewed and are negative for acute change except as noted in the  HPI  Allergies  Garlic; Penicillins; Carrot flavor; and Fentanyl  Home Medications   Prior to Admission medications   Medication Sig Start Date End Date Taking? Authorizing Provider  aspirin EC 81 MG EC tablet Take 1 tablet (81 mg total) by mouth daily. 11/22/14  Yes Brett Canales, PA-C  clopidogrel (PLAVIX) 75 MG tablet Take 1 tablet (75 mg total) by mouth daily with breakfast. 11/22/14  Yes Brett Canales, PA-C  isosorbide mononitrate (IMDUR) 30 MG 24 hr tablet Take 1 tablet (30 mg total) by mouth daily. 11/22/14  Yes Brett Canales, PA-C  ranitidine (ZANTAC) 150 MG tablet Take 1 tablet (150 mg total) by mouth 2 (two) times daily. 12/10/14  Yes Woodroe Mode, MD  colchicine 0.6 MG tablet Take 1 tablet (0.6 mg total) by mouth 2 (two) times daily. 05/19/15   Eileen Stanford, PA-C  lisinopril (ZESTRIL) 2.5 MG tablet Take 1 tablet (2.5 mg total) by mouth daily. 05/19/15   Eileen Stanford, PA-C  metoprolol tartrate (LOPRESSOR) 25 MG tablet Take 1 tablet (25 mg total) by mouth 2 (two) times daily. 05/19/15   Eileen Stanford, PA-C  nitroGLYCERIN (NITROSTAT) 0.4 MG SL tablet Place 0.4 mg under the tongue every 5 (five) minutes as needed  for chest pain (x 3 pills daily).    Historical Provider, MD   BP 104/53 mmHg  Pulse 73  Temp(Src) 98.2 F (36.8 C) (Oral)  Resp 13  Ht 5' (1.524 m)  Wt 45.042 kg  BMI 19.39 kg/m2  SpO2 100%  LMP 04/15/2015 Physical Exam  Constitutional: She appears well-developed and well-nourished. No distress.  HENT:  Head: Normocephalic and atraumatic.  Mouth/Throat: Oropharynx is clear and moist. No oropharyngeal exudate.  Eyes: Conjunctivae are normal. Pupils are equal, round, and reactive to light. Right eye exhibits no discharge. Left eye exhibits no discharge. No scleral icterus.  Neck: No tracheal deviation present.  Cardiovascular: Normal rate, regular rhythm, normal heart sounds and intact distal pulses.  Exam reveals no gallop and no friction rub.   No murmur  heard. Pulmonary/Chest: Effort normal and breath sounds normal. No respiratory distress. She has no wheezes. She has no rales. She exhibits no tenderness.  Abdominal: Soft. Bowel sounds are normal. She exhibits no distension and no mass. There is no tenderness. There is no rebound and no guarding.  Musculoskeletal: She exhibits no edema.  Lymphadenopathy:    She has no cervical adenopathy.  Neurological: She is alert. Coordination normal.  Skin: Skin is warm and dry. No rash noted. She is not diaphoretic. No erythema.  Psychiatric: She has a normal mood and affect. Her behavior is normal.  Nursing note and vitals reviewed.   ED Course  Procedures Labs Review    D-dimer, quantitative (not at [facility]) (Final result) Component (Lab Inquiry)    Collection Time Result Time D-Dimer, Quant   05/16/15 18:51:00 05/16/15 19:15:32  0.30    (Comment)    Please note change in reference range.  (NOTE)  At the manufacturer cut-off of 0.50 ug/mL FEU, this assay has been  documented to exclude PE with a sensitivity and negative predictive  value of 97 to 99%. At this time, this assay has not been approved  by the FDA to exclude DVT/VTE.  Results should be correlated with clinical presentation.                   I-stat troponin, ED (not at [facility]) (Final result) Component (Lab Inquiry)    Collection Time Result Time Troponin i, poc Comment 3   05/16/15 17:29:00 05/16/15 17:38:48 0.00      (Comment)    Due to the release kinetics of cTnI,  a negative result within the first hours  of the onset of symptoms does not rule out  myocardial infarction with certainty.  If myocardial infarction is still suspected,  repeat the test at appropriate intervals.                   Basic metabolic panel (Final result)Abnormal Component (Lab Inquiry)    Collection Time Result Time NA K CL CO2 GLUCOSE   05/16/15 17:21:00 05/16/15 18:08:01 129 (L) 3.4 (L) 101 18 (L) 76       Collection Time Result Time BUN Creatinine, Ser CALCIUM GFR calc non Af Amer GFR calc Af Amer   05/16/15 17:21:00 05/16/15 18:08:01 8 0.47 8.8 (L) >60  >60    (Comment)    (NOTE)  The eGFR has been calculated using the CKD EPI equation.  This calculation has not been validated in all clinical situations.  eGFR's persistently <60 mL/min signify possible Chronic Kidney  Disease.            Collection Time Result Time Anion gap  05/16/15 17:21:00 05/16/15 18:08:01 10             CBC (Final result) Component (Lab Inquiry)    Collection Time Result Time WBC RBC HGB HCT MCV   05/16/15 17:21:00 05/16/15 17:46:02 7.8 4.36 13.2 38.5 88.3      Collection Time Result Time MCH MCHC RDW PLT   05/16/15 17:21:00 05/16/15 17:46:02 30.3 34.3 13.7 359      Imaging Review DG Chest 2 View (Final result) Result time: 05/16/15 18:42:36   Final result by Rad Results In Interface (05/16/15 18:42:36)   Narrative:   CLINICAL DATA: 25 year old female with acute chest pain and shortness of breath today.  EXAM: CHEST 2 VIEW  COMPARISON: 11/16/2014 and 04/21/2013 chest radiographs  FINDINGS: The cardiomediastinal silhouette is unremarkable.  There is no evidence of focal airspace disease, pulmonary edema, suspicious pulmonary nodule/mass, pleural effusion, or pneumothorax. No acute bony abnormalities are identified.  IMPRESSION: No active cardiopulmonary disease.   Electronically Signed By: Margarette Canada M.D. On: 05/16/2015 18:42    I have personally reviewed and evaluated these images and lab results as part of my medical decision-making.   EKG Interpretation   Date/Time:  Monday May 16 2015 17:08:37 EST Ventricular Rate:  80 PR Interval:  152 QRS Duration: 68 QT Interval:  358 QTC Calculation: 412 R Axis:   104 Text Interpretation:  Normal sinus rhythm with sinus arrhythmia Lateral  infarct , age undetermined T wave abnormality,  consider inferior ischemia  Abnormal ECG No significant change since last tracing Confirmed by NGUYEN,  EMILY ([id number]) on 05/16/2015 7:24:38 PM      MDM   Final diagnoses:  Chest pain  Ischemic chest pain [facility])   Patient uncomfortable appearing, VSS. Most likely ACS vs anxiety vs MSK vs PE.  D-dimer, EKG, CXR, troponin, CBC unremarkable. BMP with hyponatremia of 129, hypokalemia of 3.4.  Dr. Alfonse Spruce evaluated patient and placed consult to cardiology who advised admission for rule out.  Patient may be safely admitted, and is understanding and agreement with the plan.  [place]gate Lions, PA-C 06/26/15 1415  Harvel Quale, MD 07/03/15 [phone]

## 2015-06-26 NOTE — ED Provider Notes (Signed)
CSN: [id number]     Arrival date & time 06/25/15  2304 History   First MD Initiated Contact with Patient 06/26/15 0012     Chief Complaint  Patient presents with  . Jaw Pain  . Chest Pain  . Dental Pain     (Consider location/radiation/quality/duration/timing/severity/associated sxs/prior Treatment) HPI   25 year old female with prior history of spontaneous LAD dissection after vaginal delivery in May 2016 who presents for evaluation of chest pain and jaw pain. Patient reports 3 hours ago while eating she developed acute onset of pain to her left lower jaw coming from her teeth. Pain is sharp, throbbing, persistent, severe in intensity and radiates down towards her left side of chest and mild pain to her L arm. She also endorsed lightheadedness, feeling dizzy with shortness of breath but no diaphoresis. Pain is basically similar to prior MI. No specific treatment tried. Pain did not change with positional change. Patient is currently on Plavix. EMS arrived and patient supplemental oxygen which has helped her shortness of breath. She denies URI symptoms. She denies numbness or weakness to her extremities. No other complaint.    Past Medical History  Diagnosis Date  . Ectopic pregnancy 11/2010    Right salpingectomy  . Umbilical hernia   . Infection     chlamydia  . Heart attack ([facility]) 11/16/14   Past Surgical History  Procedure Laterality Date  . Salpingectomy  2012    left ovary, ruptured ectopic  . Wisdom tooth extraction    . Induced abortion    . Cardiac catheterization N/A 11/17/2014    Procedure: Left Heart Cath and Coronary Angiography;  Surgeon: Lennette Biharihomas A Kelly, MD;  [facility];  Service: Cardiovascular;  Laterality: N/A;  . Cardiac catheterization N/A 11/17/2014    Procedure: Left Heart Cath and Coronary Angiography;  Surgeon: Lennette Biharihomas A Kelly, MD;  Location: [facility];  Service: Cardiovascular;  Laterality: N/A;   Family History  Problem Relation Age of  Onset  . Hypertension Mother   . Diabetes Father   . Hypertension Father   . Cancer Father     breast  . Vision loss Paternal Grandmother   . Other Neg Hx    Social History  Substance Use Topics  . Smoking status: Former Games developermoker  . Smokeless tobacco: Never Used     Comment: quit with preg  . Alcohol Use: No     Comment: not since pregnancy   OB History    Gravida Para Term Preterm AB TAB SAB Ectopic Multiple Living   6 3 3  0 3 1 1 1  0 3     Review of Systems  All other systems reviewed and are negative.     Allergies  Garlic; Penicillins; Carrot flavor; and Fentanyl  Home Medications   Prior to Admission medications   Medication Sig Start Date End Date Taking? Authorizing Provider  aspirin EC 81 MG EC tablet Take 1 tablet (81 mg total) by mouth daily. 11/22/14  Yes Dwana MelenaBryan W Hager, PA-C  clopidogrel (PLAVIX) 75 MG tablet Take 1 tablet (75 mg total) by mouth daily with breakfast. 11/22/14  Yes Dwana MelenaBryan W Hager, PA-C  colchicine 0.6 MG tablet Take 1 tablet (0.6 mg total) by mouth 2 (two) times daily. 05/19/15  Yes Janetta HoraKathryn R Thompson, PA-C  isosorbide mononitrate (IMDUR) 30 MG 24 hr tablet Take 1 tablet (30 mg total) by mouth daily. 11/22/14  Yes Dwana MelenaBryan W Hager, PA-C  lisinopril (ZESTRIL) 2.5 MG tablet Take  1 tablet (2.5 mg total) by mouth daily. 05/19/15  Yes Janetta Hora, PA-C  metoprolol tartrate (LOPRESSOR) 25 MG tablet Take 1 tablet (25 mg total) by mouth 2 (two) times daily. 05/19/15  Yes Janetta Hora, PA-C  nitroGLYCERIN (NITROSTAT) 0.4 MG SL tablet Place 0.4 mg under the tongue every 5 (five) minutes as needed for chest pain (x 3 pills daily).   Yes Historical Provider, MD  ranitidine (ZANTAC) 150 MG tablet Take 1 tablet (150 mg total) by mouth 2 (two) times daily. 12/10/14  Yes Adam Phenix, MD   BP 129/80 mmHg  Pulse 83  Temp(Src) 98 F (36.7 C) (Oral)  Resp 14  Ht 5' (1.524 m)  Wt 49.896 kg  BMI 21.48 kg/m2  SpO2 100%  LMP 06/15/2015 (Exact Date)   Breastfeeding? No Physical Exam  Constitutional: She is oriented to person, place, and time. She appears well-developed and well-nourished. No distress.  African-American female appears uncomfortable, crying.  HENT:  Head: Atraumatic.  Mouth: Tenderness to tooth #17 with evidence of small decay but no gingival erythema and no trismus. No obvious abscess.  Eyes: Conjunctivae are normal.  Neck: Neck supple.  Cardiovascular: Normal rate, regular rhythm and intact distal pulses.  Exam reveals no gallop and no friction rub.   No murmur heard. Pulmonary/Chest: Effort normal and breath sounds normal. She exhibits tenderness (mild left-sided chest tenderness on palpation without crepitus or emphysema.).  Abdominal: Soft. There is no tenderness.  Musculoskeletal: She exhibits no edema.  Neurological: She is alert and oriented to person, place, and time.  Skin: No rash noted.  Psychiatric: She has a normal mood and affect.  Nursing note and vitals reviewed.   ED Course  .Nerve Block Date/Time: 06/26/2015 12:37 AM Performed by: Fayrene Helper Authorized by: Fayrene Helper Consent: Verbal consent obtained. Risks and benefits: risks, benefits and alternatives were discussed Consent given by: patient Patient understanding: patient states understanding of the procedure being performed Patient consent: the patient's understanding of the procedure matches consent given Patient identity confirmed: verbally with patient and arm band Indications: pain relief Body area: face/mouth Nerve: inferior alveolar Laterality: left Patient position: sitting Needle gauge: 27 G Location technique: anatomical landmarks Local anesthetic: bupivacaine 0.5% with epinephrine Anesthetic total: 1.8 ml Outcome: pain improved Patient tolerance: Patient tolerated the procedure well with no immediate complications   (including critical care time) Labs Review Labs Reviewed  BASIC METABOLIC PANEL - Abnormal; Notable for the  following:    CO2 19 (*)    All other components within normal limits  CBC  I-STAT TROPOININ, ED    Imaging Review Dg Chest 2 View  06/25/2015  CLINICAL DATA:  1 hour pta pt began having left side jaw pain radiating down into the left chest. Pt has hx of MI in June of 2016. 12 leads show some variance and some depression. Pt admits to sob, lightheadedness. Pt states that she has been having dental problems on the left side of her jaw. EXAM: CHEST  2 VIEW COMPARISON:  05/16/2015 FINDINGS: The heart size and mediastinal contours are within normal limits. Both lungs are clear. No pleural effusion or pneumothorax. The visualized skeletal structures are unremarkable. IMPRESSION: Normal chest radiographs. Electronically Signed   By: Amie Portland M.D.   On: 06/25/2015 23:44   I have personally reviewed and evaluated these images and lab results as part of my medical decision-making.   EKG Interpretation None     ED ECG REPORT   Date: 06/26/2015  Rate: 96  Rhythm: sinus arrhythmia  QRS Axis: normal  Intervals: normal  ST/T Wave abnormalities: nonspecific ST/T changes  Conduction Disutrbances:none  Narrative Interpretation:   Old EKG Reviewed: unchanged  I have personally reviewed the EKG tracing and agree with the computerized printout as noted.   MDM   Final diagnoses:  Pain in lower jaw  Chest pain, unspecified chest pain type    BP 116/75 mmHg  Pulse 108  Temp(Src) 98 F (36.7 C) (Oral)  Resp 16  Ht 5' (1.524 m)  Wt 49.896 kg  BMI 21.48 kg/m2  SpO2 99%  LMP 06/15/2015 (Exact Date)  Breastfeeding? No   Patient initially presented with dental pain however pain radiates to her chest, and she does have a significant cardiac history and currently on blood thinner medication. 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  Recent Coronary CT from 05/17/15 showed ectatic proximal LAD, but no plaque in any of the vessels and great flow in all three territories.  Cardiac MRI showed LVEF 45% and 3 areas of transmural late gadolinium enhancement including mid to distal LAD, distal and mid inferior wall and mid and basal inferolateral walls suggestive of prior MI in the mid and distal LAD but also in the LCX territory.  Pt was admitted to the hospital on Dec 1st for similar CP and syncope.  Diagnosed with pericarditis and was   When she arrived to ER via EMS, she was initially placed on 4 L of supplemental oxygen. I have removed the nasal cannula and patient maintains 100% O2 sat on room air.  An inferior aveolar dental block was performed with adequate anesthesia to the left side of her jaw. Patient now states she is having right jaw pain and still having active chest pain. Morphine and supplemental nitroglycerin provided. Will consult cardiology for admission.  1:09 AM Appreciate consultation from cardiologist who recommend medicine for admission for chest pain rule out.  Cardiology will be available for consultation.  Plan to consult medicine for admission.     1:15 AM I have consulted [facility] and spoke with oncall resident who agrees to see pt in the ER and will admit under attending Dr. Payton Mccallum.  Pt current endorse pain despite receiving SL nitro and morphine.  Since she had previous similar pain and was diagnosed with pericarditis and placed on indomethacine, i will give her toradol 30mg  IV for pain treatment.  Temporary admit order filled.  Pt is currently stable.    Fayrene Helper, PA-C 06/26/15 0127  Derwood Kaplan, MD 06/26/15 [id number]

## 2015-06-26 NOTE — H&P (Signed)
Family Medicine Teaching [facility] Admission History and Physical Service Pager: [phone]  Patient name: Jenny HillockLatrice Broadwater Medical record number: [id number] Date of birth: 01/03/1991 Age: 25 y.o. Gender: female  Primary Care Provider: No PCP Per Patient Consultants: none  Code Status: partial  Chief Complaint: chest,dental pain   Assessment and Plan: Jenny Myers is a 25 y.o. female presenting with chest pain. 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 .   ACS rule out: Most likely pericarditis versus musculoskeletal pain. Admitted in November with chest pain and thought to be secondary to pericarditis. She reports today feeling the same way. Pain is worse with sitting forward and also reproducible on palpation. Not having any diaphoresis, nausea, vomiting, or radiation to her back. No suggestion of infection. Does not report a history of any drug use and no changes in medications. No prior history of blood clots, tobacco use, hormone use, or travel. Heart score of 0-1. - Admit to telemetry, Dr. Gwendolyn GrantWalden attending - Cycle troponins - EKG in the morning - Nitroglycerin when necessary - Ibuprofen if recurrence of pericarditis - Continue home colchicine - Continue lisinopril as she is not breast-feeding anymore - holding beta blocker until UDS  - UA and UDS pending   Dental pain: This was her initial reason for presenting to the emergency department. Occurring in the left lower  mandible. Doesn't appear to be infected. Doubtful for ludwig's angina as she has no choking, trouble swallowing, fever, and chills. Does not report any trauma and no broken tooth. Has a distant history of wisdom tooth extraction. Receive dental block in the ED which had some improvement -Dilaudid for pain, wean this as appropriate - Consider orthopantogram - may need to start emperic antibiotics if no  improvement in pain   Hx of post partum STEMI: Last ECHO showing 45-50% EF with grade 1 DD. Was evaluated by heart catheterization thought that medical management be most beneficial - Continue aspirin, Plavix, Imdur  GERD: Stable - Continue Zantac  FEN/GI: clear liquids, saline lock  Prophylaxis: lovenox   Disposition: admitted to family medicine teaching service for acs rule out.   History of Present Illness:  Jenny HillockLatrice Olivo is a 25 y.o. female presenting with ACS rule out. She initially started having pain in her left lower mandible from her teeth. This started one week ago and has gotten worse. Pain is significant with eating but denies any trauma or breaking her teeth. She has had no trouble swallowing or breathing. Tonight she started having chest pain on the left side of her chest into her back. This is a sharp chest pain with no relief with home remedies. She has some associated shortness of breath. She denies the pain being related to activity, diaphoresis, nausea, vomiting, fevers, or chills. She has a history of a postpartum myocardial infarction. She denies smoking tobacco or drinking since the birth of her child in June. She denies any birth control, travel, or any hormone therapy. Reports the pain is better when lying forward and she is not able to lie flat.  In the emergency department she has receive nitroglycerin 3 times and 80 mg of morphine with limited improvement of her symptoms. She received a dental block and that had some improvement of her jaw pain.   Review Of Systems: Per HPI with the following additions: See HPI  Otherwise the remainder of the systems were negative.  Patient Active Problem List  Diagnosis Date Noted  . Chest pain 06/26/2015  . Acute pericarditis 05/19/2015  . Syncope 05/17/2015  . LV dysfunction   . History of gastroesophageal reflux (GERD) 12/10/2014  . Acute coronary syndrome (HCC) 11/17/2014  . STEMI (ST elevation myocardial infarction)  (HCC) 11/17/2014  . 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 Marijuana abuse 07/21/2014  . Family history of breast cancer in female 07/15/2014  . Umbilical hernia 07/15/2014    Past Medical History: Past Medical History  Diagnosis Date  . Ectopic pregnancy 11/2010    Right salpingectomy  . Umbilical hernia   . Infection     chlamydia  . Heart attack (HCC) 11/16/14    Past Surgical History: Past Surgical History  Procedure Laterality Date  . Salpingectomy  2012    left ovary, ruptured ectopic  . Wisdom tooth extraction    . Induced abortion    . Cardiac catheterization N/A 11/17/2014    Procedure: Left Heart Cath and Coronary Angiography;  Surgeon: Lennette Bihari, MD;  Location: [facility] INVASIVE CV LAB;  Service: Cardiovascular;  Laterality: N/A;  . Cardiac catheterization N/A 11/17/2014    Procedure: Left Heart Cath and Coronary Angiography;  Surgeon: Lennette Bihari, MD;  Location: [facility] INVASIVE CV LAB;  Service: Cardiovascular;  Laterality: N/A;    Social History: Social History  Substance Use Topics  . Smoking status: Former Games developer  . Smokeless tobacco: Never Used     Comment: quit with preg  . Alcohol Use: No     Comment: not since pregnancy   Additional social history: former smoker. No alcohol or illicit drug use  Please also refer to relevant sections of EMR.  Family History: Family History  Problem Relation Age of Onset  . Hypertension Mother   . Diabetes Father   . Hypertension Father   . Cancer Father     breast  . Vision loss Paternal Grandmother   . Other Neg Hx     Allergies and Medications: Allergies  Allergen Reactions  . Garlic Anaphylaxis  . Penicillins Anaphylaxis and Nausea And Vomiting  . Carrot Flavor Hives  . Fentanyl Hives   No current facility-administered medications on file prior to encounter.   Current Outpatient Prescriptions on File Prior to Encounter  Medication Sig Dispense Refill  . aspirin EC 81 MG EC tablet Take 1  tablet (81 mg total) by mouth daily.    . clopidogrel (PLAVIX) 75 MG tablet Take 1 tablet (75 mg total) by mouth daily with breakfast. 30 tablet 11  . colchicine 0.6 MG tablet Take 1 tablet (0.6 mg total) by mouth 2 (two) times daily. 60 tablet 3  . isosorbide mononitrate (IMDUR) 30 MG 24 hr tablet Take 1 tablet (30 mg total) by mouth daily. 30 tablet 11  . lisinopril (ZESTRIL) 2.5 MG tablet Take 1 tablet (2.5 mg total) by mouth daily. 90 tablet 1  . metoprolol tartrate (LOPRESSOR) 25 MG tablet Take 1 tablet (25 mg total) by mouth 2 (two) times daily. 60 tablet 11  . nitroGLYCERIN (NITROSTAT) 0.4 MG SL tablet Place 0.4 mg under the tongue every 5 (five) minutes as needed for chest pain (x 3 pills daily).    . ranitidine (ZANTAC) 150 MG tablet Take 1 tablet (150 mg total) by mouth 2 (two) times daily. 60 tablet 3    Objective: BP 116/75 mmHg  Pulse 108  Temp(Src) 98 F (36.7 C) (Oral)  Resp 16  Ht 5' (1.524 m)  Wt  110 lb (49.896 kg)  BMI 21.48 kg/m2  SpO2 99%  LMP 06/15/2015 (Exact Date)  Breastfeeding? No Exam: General: No acute distress,  sitting up in bed Eyes: Clear conjunctiva ENTM: Uvula midline, moist mucous membranes, tender to palpation on left lower mandible, no cervical lymphadenopathy Neck: Supple Cardiovascular: Tachycardic, regular rhythm, S1-S2, no murmurs, rubs, gallops Respiratory: Clear to auscultation bilaterally, normal effort, no crackles or wheezes Abdomen: Soft, nontender, nondistended, positive bowel sounds, no hepatomegaly MSK: Moves all freely, no edema Skin: No overlying rash or erythema Neuro: No gross deficits Psych: Alert and oriented  Labs and Imaging: CBC BMET   Recent Labs Lab 06/25/15 2319  WBC 10.1  HGB 12.5  HCT 37.0  PLT 378    Recent Labs Lab 06/25/15 2319  NA 139  K 4.1  CL 110  CO2 19*  BUN 13  CREATININE 0.45  GLUCOSE 86  CALCIUM 9.3     EKG: unchanged from previous.   Dg Chest 2 View  06/25/2015  CLINICAL DATA:  1  hour pta pt began having left side jaw pain radiating down into the left chest. Pt has hx of MI in June of 2016. 12 leads show some variance and some depression. Pt admits to sob, lightheadedness. Pt states that she has been having dental problems on the left side of her jaw. EXAM: CHEST  2 VIEW COMPARISON:  05/16/2015 FINDINGS: The heart size and mediastinal contours are within normal limits. Both lungs are clear. No pleural effusion or pneumothorax. The visualized skeletal structures are unremarkable. IMPRESSION: Normal chest radiographs. Electronically Signed   By: Amie Portland M.D.   On: 06/25/2015 23:44    Myra Rude, MD 06/26/2015, 1:35 AM PGY-3, [facility] FPTS Intern pager: [phone], text pages welcome

## 2015-06-26 NOTE — Progress Notes (Signed)
Patient Name: Jenny Myers Date of Encounter: 06/26/2015     Active Problems:   Chest pain    SUBJECTIVE  She currently has jaw pain that is "radiating down into her left chest." Nerve block done in ED resolved both her jaw and chest pain. Now that it has worn off, both pains are back.  CURRENT MEDS . aspirin EC  81 mg Oral Daily  . clopidogrel  75 mg Oral Daily  . colchicine  0.6 mg Oral BID  . enoxaparin (LOVENOX) injection  30 mg Subcutaneous Q24H  . famotidine  10 mg Oral Daily  . isosorbide mononitrate  30 mg Oral Daily  . lisinopril  2.5 mg Oral Daily  . oxyCODONE-acetaminophen  1 tablet Oral 3 times per day    OBJECTIVE  Filed Vitals:   06/26/15 0200 06/26/15 0230 06/26/15 0255 06/26/15 0500  BP: 128/79 120/63 120/67 108/63  Pulse: 95 104  83  Temp:   98.3 F (36.8 C) 97.8 F (36.6 C)  TempSrc:      Resp: 14 23 20 17   Height:   5' (1.524 m)   Weight:   100 lb 11.2 oz (45.677 kg)   SpO2: 100% 98% 98% 100%    Intake/Output Summary (Last 24 hours) at 06/26/15 0824 Last data filed at 06/26/15 0500  Gross per 24 hour  Intake      0 ml  Output      0 ml  Net      0 ml   Filed Weights   06/25/15 2312 06/25/15 2313 06/26/15 0255  Weight: 110 lb (49.896 kg) 110 lb (49.896 kg) 100 lb 11.2 oz (45.677 kg)    PHYSICAL EXAM  General: Pleasant, NAD. Neuro: Alert and oriented X 3. Moves all extremities spontaneously. Neck: Supple without bruits or JVD. Lungs:  Resp regular and unlabored, CTA. Heart: RRR no s3, s4, or murmurs. Tender to palpation over left precordium Extremities: No clubbing, cyanosis or edema. DP/PT/Radials 2+ and equal bilaterally.  Accessory Clinical Findings  CBC  Recent Labs  06/25/15 2319  WBC 10.1  HGB 12.5  HCT 37.0  MCV 89.2  PLT 378   Basic Metabolic Panel  Recent Labs  06/25/15 2319  NA 139  K 4.1  CL 110  CO2 19*  GLUCOSE 86  BUN 13  CREATININE 0.45  CALCIUM 9.3   Cardiac Enzymes  Recent Labs   06/26/15 0340 06/26/15 0605  TROPONINI <0.03 0.03    TELE  NSR with some sinus tachycardia. NSVT that looks like artifacts  Radiology/Studies  Dg Chest 2 View  06/25/2015  CLINICAL DATA:  1 hour pta pt began having left side jaw pain radiating down into the left chest. Pt has hx of MI in June of 2016. 12 leads show some variance and some depression. Pt admits to sob, lightheadedness. Pt states that she has been having dental problems on the left side of her jaw. EXAM: CHEST  2 VIEW COMPARISON:  05/16/2015 FINDINGS: The heart size and mediastinal contours are within normal limits. Both lungs are clear. No pleural effusion or pneumothorax. The visualized skeletal structures are unremarkable. IMPRESSION: Normal chest radiographs. Electronically Signed   By: Amie Portland M.D.   On: 06/25/2015 23:44    ASSESSMENT AND PLAN  Jenny Myers is a 25 y.o. female with a history of spontaneous LAD dissection after vag delivery 11/2014- treated medically, mild LV dysfunction (LVEF 48% by CMR on 05/17/2015), recurrent CP evaluated with CMR and coronary CTA  in 04/2015 and chest pain felt to be due to acute pericarditis during 05/2015 admission who was admitted last night (06/25/15) with left lower jaw pain and CP.   CP, felt to be non-cardiac and likely musculoskeletal  -- History of spontaneous LAD dissection in 11/2014 after vaginal delivery; treated with ASA and Plavix -- Troponin neg x3. ECG with no acute ST or TW changes. She has some inferior TWIs that are similar to previous  -- Pain is reproducible with direct pressure (tender) and deep breathing and coughing -- Recent admission for same last month when ruled out; cardiac MR suggested acute focal pericarditis treated with Indocin and Colchicine; CAC score 0 and no CAD or dissection by coronary CT. She remains on colchicine  -- Continue ASA/plavix. She is on Lopressor 25mg  BID as an outpatient, but this was not reordered on admission. I will restart  at 12.5 mg BID as her BP is soft, but she has some sinus tachycardia on tele  Mild LV dysfunction by echo 11/2014 -- EF 45-50%, HK of the basal inferior myocardium, mild MR, G1DD on ECHO 11/20/2014 -- Cardiac MRI 05/17/15 showed LVEF 48% and 3 areas of transmural late gadolinium enhancement including mid to distal LAD, distal and mid inferior wall and mid and basal inferolateral walls suggestive of prior MI in the mid and distal LAD but also in the LCX territory. -- She appears euvolemic on exam. Continue ACE. She was on BB as an outpatient, but not continued here- pending UDS results. Will restart at a lower dose as above  Jaw pain likely local due to dental issue -- Left inferior alveolar nerve block performed in the ED. She has dental follow up sometime next week.   Billy FischerSigned, THOMPSON, KATHRYN R PA-C  Pager [phone]  Patient seen and examined.  Note above read.  Repeat EKG shows some T wave inversion in lead V3.  She predominantly complains of jaw pain.  I would continue to treat her with nonsteroidal anti-inflammatory agents and would repeat an EKG in the morning.  She needs to have her tooth pain addressed.  Darden PalmerW. Spencer Tilley, Jr. MD [facility] 06/26/2015 10:11 AM

## 2015-06-26 NOTE — Consult Note (Signed)
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   She reports that couple months ago the filling of one of left lower molar fell off while eating and since she has been having ache in that area off and on. Today, the pain started again while eating, severe, constant. Soon after she also started to feel pain in the left chest, sharp, constant not associated with SOB, palpitations or diaphoresis. Symptoms lasted about 3 hours before she called 911.     Review of Systems:  10 systems reviewed unremarkable except as noted in HPI   Past Medical History  Diagnosis Date  . Ectopic pregnancy 11/2010    Right salpingectomy  . Umbilical hernia   . Infection     chlamydia  . Heart attack (HCC) 11/16/14     (Not in a hospital admission)      Infusions:    Allergies  Allergen Reactions  . Garlic Anaphylaxis  . Penicillins Anaphylaxis and Nausea And Vomiting  . Carrot Flavor Hives  . Fentanyl Hives    Social History   Social History  . Marital Status: Single    Spouse Name: N/A  . Number of Children: N/A  . Years of Education: N/A   Occupational History  . Not on file.   Social History Main Topics  . Smoking status: Former Games developer  . Smokeless tobacco: Never Used     Comment: quit with preg  . Alcohol Use: No     Comment: not since pregnancy  . Drug Use: No  . Sexual Activity: Not Currently    Birth Control/ Protection: None   Other Topics Concern  . Not on file   Social History Narrative    Family History  Problem Relation Age of Onset  . Hypertension Mother   . Diabetes Father   . Hypertension Father   . Cancer Father     breast  . Vision loss Paternal  Grandmother   . Other Neg Hx     No current facility-administered medications on file prior to encounter.   Current Outpatient Prescriptions on File Prior to Encounter  Medication Sig Dispense Refill  . aspirin EC 81 MG EC tablet Take 1 tablet (81 mg total) by mouth daily.    . clopidogrel (PLAVIX) 75 MG tablet Take 1 tablet (75 mg total) by mouth daily with breakfast. 30 tablet 11  . colchicine 0.6 MG tablet Take 1 tablet (0.6 mg total) by mouth 2 (two) times daily. 60 tablet 3  . isosorbide mononitrate (IMDUR) 30 MG 24 hr tablet Take 1 tablet (30 mg total) by mouth daily. 30 tablet 11  . lisinopril (ZESTRIL) 2.5 MG tablet Take 1 tablet (2.5 mg total) by mouth daily. 90 tablet 1  . metoprolol tartrate (LOPRESSOR) 25 MG tablet Take 1 tablet (25 mg total) by mouth 2 (two) times daily. 60 tablet 11  . nitroGLYCERIN (NITROSTAT) 0.4 MG SL tablet Place 0.4 mg under the tongue every 5 (five) minutes as needed for chest pain (x 3 pills daily).    . ranitidine (ZANTAC) 150 MG tablet Take 1 tablet (150 mg total) by mouth 2 (two) times daily. 60 tablet 3     PHYSICAL  EXAMCeasar Mons Vitals:   06/26/15 0043 06/26/15 0054  BP: 116/87 115/86  Pulse: 85 101  Temp:    Resp: 16 15    No intake or output data in the 24 hours ending 06/26/15 0109  General:  Well appearing. No respiratory difficulty HEENT: normal Neck: supple. no JVD. Carotids 2+ bilat; no bruits. No lymphadenopathy or thryomegaly appreciated. Cor: PMI nondisplaced. Regular rate & rhythm. No rubs, gallops or murmurs. Significant tenderness in the left lower parasternal region.  Lungs: clearl tender left chest wall Abdomen: soft, nontender, nondistended. No hepatosplenomegaly. No bruits or masses. Good bowel sounds. Extremities: no cyanosis, clubbing, rash, edema Neuro: alert & oriented x 3, cranial nerves grossly intact. moves all 4 extremities w/o difficulty. Affect pleasant.  ECG: NSR, normal Av conduction, narrow QRS, no acute  ST-T changes  Results for orders placed or performed during the hospital encounter of 06/25/15 (from the past 24 hour(s))  Basic metabolic panel     Status: Abnormal   Collection Time: 06/25/15 11:19 PM  Result Value Ref Range   Sodium 139 135 - 145 mmol/L   Potassium 4.1 3.5 - 5.1 mmol/L   Chloride 110 101 - 111 mmol/L   CO2 19 (L) 22 - 32 mmol/L   Glucose, Bld 86 65 - 99 mg/dL   BUN 13 6 - 20 mg/dL   Creatinine, Ser 0.45 0.44 - 1.00 mg/dL   Calcium 9.3 8.9 - 40.9 mg/dL   GFR calc non Af Amer >60 >60 mL/min   GFR calc Af Amer >60 >60 mL/min   Anion gap 10 5 - 15  CBC     Status: None   Collection Time: 06/25/15 11:19 PM  Result Value Ref Range   WBC 10.1 4.0 - 10.5 K/uL   RBC 4.15 3.87 - 5.11 MIL/uL   Hemoglobin 12.5 12.0 - 15.0 g/dL   HCT 81.1 91.4 - 78.2 %   MCV 89.2 78.0 - 100.0 fL   MCH 30.1 26.0 - 34.0 pg   MCHC 33.8 30.0 - 36.0 g/dL   RDW 95.6 21.3 - 08.6 %   Platelets 378 150 - 400 K/uL  I-stat troponin, ED (not at [facility], [facility])     Status: None   Collection Time: 06/25/15 11:23 PM  Result Value Ref Range   Troponin i, poc 0.00 0.00 - 0.08 ng/mL   Comment 3           Dg Chest 2 View  06/25/2015  CLINICAL DATA:  1 hour pta pt began having left side jaw pain radiating down into the left chest. Pt has hx of MI in June of 2016. 12 leads show some variance and some depression. Pt admits to sob, lightheadedness. Pt states that she has been having dental problems on the left side of her jaw. EXAM: CHEST  2 VIEW COMPARISON:  05/16/2015 FINDINGS: The heart size and mediastinal contours are within normal limits. Both lungs are clear. No pleural effusion or pneumothorax. The visualized skeletal structures are unremarkable. IMPRESSION: Normal chest radiographs. Electronically Signed   By: Amie Portland M.D.   On: 06/25/2015 23:44     ASSESSMENT:  1. CP, non-cardiac and likely musculoskeletal  - negative initial Trop and ECG - Pain is reproducible with direct pressure (tender) and  deep breathing and coughing - Recent admission for same last month when ruled out; cardiac MR suggested acute focal pericarditis treated with Indocin and Colchicine; CAC score 0 and no CAD or dissection by coronary CTA - history  of spontaneous LAD dissection in 11/2014 after vaginal delivery; treated with ASA and Plavix - Has had mild LV systolic dysfunction   2. Jaw pain likely local due to dental issue - left inferior alveolar nerve block was performed by the ER provider   PLAN/DISCUSSION:  - Agree with observation, trend Trop to rule out - She already has a dental follow up this coming week   Thanks for the consult  Cardiology will follow     Nevin BloodgoodAmir Nabiha Planck, MD Cardiology

## 2015-06-26 NOTE — Progress Notes (Signed)
Pt continues to report bilateral jaw pain and headache/throbbing 10/10.  Pt denies chest pain.  Pt given dilaudid in ED prior to transport, ibuprofen and tylenol given per orders.  Pt currently requesting repeat nerve block as performed in ED.  MD paged and advised, will continue to monitor.

## 2015-06-27 ENCOUNTER — Encounter (HOSPITAL_COMMUNITY): Payer: Self-pay | Admitting: Physician Assistant

## 2015-06-27 DIAGNOSIS — R6884 Jaw pain: Secondary | ICD-10-CM | POA: Insufficient documentation

## 2015-06-27 DIAGNOSIS — K0889 Other specified disorders of teeth and supporting structures: Secondary | ICD-10-CM

## 2015-06-27 DIAGNOSIS — I252 Old myocardial infarction: Secondary | ICD-10-CM | POA: Diagnosis not present

## 2015-06-27 DIAGNOSIS — R079 Chest pain, unspecified: Secondary | ICD-10-CM | POA: Insufficient documentation

## 2015-06-27 DIAGNOSIS — Z7902 Long term (current) use of antithrombotics/antiplatelets: Secondary | ICD-10-CM | POA: Diagnosis not present

## 2015-06-27 DIAGNOSIS — K05219 Aggressive periodontitis, localized, unspecified severity: Secondary | ICD-10-CM | POA: Diagnosis present

## 2015-06-27 DIAGNOSIS — R0789 Other chest pain: Secondary | ICD-10-CM | POA: Diagnosis present

## 2015-06-27 DIAGNOSIS — K219 Gastro-esophageal reflux disease without esophagitis: Secondary | ICD-10-CM | POA: Insufficient documentation

## 2015-06-27 DIAGNOSIS — Z7982 Long term (current) use of aspirin: Secondary | ICD-10-CM | POA: Diagnosis not present

## 2015-06-27 DIAGNOSIS — Z87891 Personal history of nicotine dependence: Secondary | ICD-10-CM | POA: Diagnosis not present

## 2015-06-27 DIAGNOSIS — K047 Periapical abscess without sinus: Secondary | ICD-10-CM | POA: Diagnosis present

## 2015-06-27 DIAGNOSIS — Z79899 Other long term (current) drug therapy: Secondary | ICD-10-CM | POA: Diagnosis not present

## 2015-06-27 LAB — RHEUMATOID FACTOR: Rhuematoid fact SerPl-aCnc: <10 IU/mL (ref 0.0–13.9)

## 2015-06-27 LAB — ANTINUCLEAR ANTIBODIES, IFA: ANA Ab, IFA: NEGATIVE

## 2015-06-27 MED ORDER — CLINDAMYCIN PHOSPHATE 900 MG/50ML IV SOLN
900.0000 mg | Freq: Three times a day (TID) | INTRAVENOUS | Status: DC
  Administered 2015-06-27 – 2015-06-28 (×3): 900 mg via INTRAVENOUS
  Filled 2015-06-27 (×5): qty 50

## 2015-06-27 NOTE — Progress Notes (Signed)
Patient Name: Jenny HillockLatrice Ulysse Date of Encounter: 06/27/2015  Principal Problem:   Pain in upper jaw Active Problems:   Chest pain   Primary Cardiologist: Dr Delton SeeNelson  Patient Profile: 25 yo female w/ hx SCAD LAD after nl vag delivery 10/2014, CP  2nd pericarditis 05/2015, admitted 01/07 w/ jaw pain and chest pain. Ez neg MI, chest pain improved w/ nerve block of jaw.   SUBJECTIVE: Still w/ 10/10 jaw pain and some chest pain, no change w/ deep inspiration  OBJECTIVE Filed Vitals:   06/26/15 0500 06/26/15 1900 06/27/15 0400 06/27/15 0542  BP: 108/63 121/77 124/77 124/80  Pulse: 83 60 87 86  Temp: 97.8 F (36.6 C) 98.2 F (36.8 C) 98.6 F (37 C)   TempSrc:      Resp: 17 19 20    Height:      Weight:   99 lb 4.8 oz (45.042 kg)   SpO2: 100% 98% 97%     Intake/Output Summary (Last 24 hours) at 06/27/15 0957 Last data filed at 06/27/15 0400  Gross per 24 hour  Intake    360 ml  Output      0 ml  Net    360 ml   Filed Weights   06/25/15 2313 06/26/15 0255 06/27/15 0400  Weight: 110 lb (49.896 kg) 100 lb 11.2 oz (45.677 kg) 99 lb 4.8 oz (45.042 kg)    PHYSICAL EXAM General: Well developed, well nourished, female in no acute distress. Head: Normocephalic, atraumatic.  Neck: Supple without bruits, JVD not elevated. Lungs:  Resp regular and unlabored, CTA. Heart: RRR, S1, S2, no S3, S4, soft murmur; no rub. Abdomen: Soft, non-tender, non-distended, BS + x 4.  Extremities: No clubbing, cyanosis, edema.  Neuro: Alert and oriented X 3. Moves all extremities spontaneously. Psych: Normal affect.  LABS: CBC:  Recent Labs  06/25/15 2319  WBC 10.1  HGB 12.5  HCT 37.0  MCV 89.2  PLT 378   Basic Metabolic Panel:  Recent Labs  82/95/6200/01/01 2319  NA 139  K 4.1  CL 110  CO2 19*  GLUCOSE 86  BUN 13  CREATININE 0.45  CALCIUM 9.3   Cardiac Enzymes:  Recent Labs  06/26/15 0340 06/26/15 0605 06/26/15 1145  TROPONINI <0.03 0.03 <0.03    Recent Labs   06/25/15 2323  TROPIPOC 0.00   Drugs of Abuse     Component Value Date/Time   LABOPIA POSITIVE* 06/26/2015 0622   COCAINSCRNUR NONE DETECTED 06/26/2015 0622   LABBENZ NONE DETECTED 06/26/2015 0622   AMPHETMU NONE DETECTED 06/26/2015 0622   THCU NONE DETECTED 06/26/2015 0622   LABBARB NONE DETECTED 06/26/2015 0622     TELE: SR, ST     Radiology/Studies: Dg Orthopantogram 06/26/2015  CLINICAL DATA:  Lower left-sided dental pain. EXAM: ORTHOPANTOGRAM/PANORAMIC COMPARISON:  None. FINDINGS: Periapical lucency adjacent to the root of the next to last maxillary molar on the left compatible with periodontal abscess. IMPRESSION: Periodontal lucency adjacent to the root of the "next to last" maxillary molar on the left suggesting abscess. Electronically Signed   By: Bary RichardStan  Maynard M.D.   On: 06/26/2015 14:50   Dg Chest 2 View 06/25/2015  CLINICAL DATA:  1 hour pta pt began having left side jaw pain radiating down into the left chest. Pt has hx of MI in June of 2016. 12 leads show some variance and some depression. Pt admits to sob, lightheadedness. Pt states that she has been having dental problems on the left side of her  jaw. EXAM: CHEST  2 VIEW COMPARISON:  05/16/2015 FINDINGS: The heart size and mediastinal contours are within normal limits. Both lungs are clear. No pleural effusion or pneumothorax. The visualized skeletal structures are unremarkable. IMPRESSION: Normal chest radiographs. Electronically Signed   By: Amie Portland M.D.   On: 06/25/2015 23:44     Current Medications:  . aspirin EC  81 mg Oral Daily  . clindamycin (CLEOCIN) IV  600 mg Intravenous Q8H  . clopidogrel  75 mg Oral Daily  . colchicine  0.6 mg Oral BID  . enoxaparin (LOVENOX) injection  30 mg Subcutaneous Q24H  . famotidine  10 mg Oral Daily  . isosorbide mononitrate  30 mg Oral Daily  . lisinopril  2.5 mg Oral Daily  . metoprolol tartrate  12.5 mg Oral BID  . oxyCODONE-acetaminophen  1 tablet Oral 3 times per day       ASSESSMENT AND PLAN: CP, felt to be non-cardiac and likely musculoskeletal  -- History of spontaneous LAD dissection in 11/2014 after vaginal delivery; treated with ASA and Plavix -- Troponin neg x3. ECG with no acute ST or TW changes. She has some inferior TWIs, similar to previous  -- Admission for same last month; cardiac MR suggested acute focal pericarditis treated with Indocin and Colchicine; CAC score 0 and no CAD or dissection by coronary CT. She remains on colchicine  -- Continue ASA/plavix. She is on Lopressor 25mg  BID as an outpatient, but this was not reordered on admission. Restarted on Lopressor 01/08 at 12.5 mg BID  Mild LV dysfunction by echo 11/2014 -- EF 45-50%, HK of the basal inferior myocardium, mild MR, G1DD on ECHO 11/20/2014 -- Cardiac MRI 05/17/15 showed LVEF 48% and 3 areas of transmural late gadolinium enhancement including mid to distal LAD, distal and mid inferior wall and mid and basal inferolateral walls suggestive of prior MI in the mid and distal LAD but also in the LCX territory. -- She appears euvolemic on exam. Continue ACE. She was on BB as an outpatient, but not continued here at first. Once UDS negative, BB restarted at decreased dose  Jaw pain likely local due to dental issue -- Left inferior alveolar nerve block performed in the ED w/ relief -- Abscess on Xray, dental consult pending -- per IM   Signed, Barrett, Rhonda , PA-C 9:57 AM 06/27/2015 EKG this morning personally reviewed.  No change in the chronic T wave abnormalities seen on previous tracings.  She is having no further chest discomfort.  She continues to have severe pain from the dental problem in the left jaw.  Dental consult is pending.  She has been restarted on her outpatient beta blocker.  Continue current cardiac medications of beta blocker, ACE inhibitor, aspirin, Plavix, and nitrates.  Continue colchicine for recent pericarditis in December 2016.  No further cardiac workup  anticipated.  Discharge when dental situation is stable.

## 2015-06-27 NOTE — Discharge Summary (Signed)
Family Medicine Teaching [facility] Discharge Summary  Patient name: Jenny Myers Medical record number: [id number] Date of birth: 1990/12/30 Age: 25 y.o. Gender: female Date of Admission: 06/25/2015  Date of Discharge: 06/28/15 Admitting Physician: Tobey Grim, MD  Primary Care Provider: No PCP Per Patient Consultants: Cardiology, Dentistry, Oral Surgery  Indication for Hospitalization: Chest pain  Discharge Diagnoses/Problem List:  Chest pain, resolved Dental abscess GERD  Disposition: Home  Discharge Condition: Stable  Discharge Exam:  General: No acute distress,sitting up in bed Eyes: Clear conjunctiva ENTM: Uvula midline, moist mucous membranes, tender to palpation on left lower mandible, no drainage present. 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 In the ED, she received Nitroglycerin x 3 and 8mg  morphine with limited improvement in pain. She also received a dental nerve block. I-stat troponin was 0.00. EKG showed inverted T waves in the inferior leads, which was present on previous EKGs. She was admitted for ACS rule out.  We trended her troponins, which were negative x 3. We repeated her EKG, which was unchanged from previous. We continued her home colchicine, nitroglycerin, ASA, and plavix. We also continued her home Lisinopril. Cardiology was consulted and recommended that we continue her home medications. They did not think she warranted any  further work-up. Her chest pain resolved.  For her jaw pain, she was treated with Dilaudid 1mg  q2hrs prn. Orthopantogram showed periodontal abscess adjacent to the root of a left maxillary molar. Dentistry was consulted and recommended tooth extraction by oral surgery. Oral surgery was consulted, who recommended that she be seen in their clinic for extraction. She was discharged with a follow-up appointment with oral surgery that same day.  Issues for Follow Up:  1. None  Significant Procedures: None  Significant Labs and Imaging:   Recent Labs Lab 06/25/15 2319  WBC 10.1  HGB 12.5  HCT 37.0  PLT 378    Recent Labs Lab 06/25/15 2319  NA 139  K 4.1  CL 110  CO2 19*  GLUCOSE 86  BUN 13  CREATININE 0.45  CALCIUM 9.3   UA: many bacteria, negative leukocytes, negative nitrites, TNTC squams, 6-30 WBC UDS: positive for opiates Troponins: <0.03, <0.03, <0.03 ANA: negative RF: <10  CXR: No acute process Orthopantogram: Periodontal lucency adjacent to the root of the next to last maxillary molar on the left suggesting abscess.  Results/Tests Pending at Time of Discharge: None  Discharge Medications:    Medication List    TAKE these medications        aspirin 81 MG EC tablet  Take 1 tablet (81 mg total) by mouth daily.     clopidogrel 75 MG tablet  Commonly known as:  PLAVIX  Take 1 tablet (75 mg total) by mouth daily with breakfast.     colchicine 0.6 MG tablet  Take 1 tablet (0.6 mg total) by mouth 2 (two) times daily.     isosorbide mononitrate 30 MG 24 hr tablet  Commonly known as:  IMDUR  Take 1 tablet (30 mg total)  by mouth daily.     lisinopril 2.5 MG tablet  Commonly known as:  ZESTRIL  Take 1 tablet (2.5 mg total) by mouth daily.     metoprolol tartrate 25 MG tablet  Commonly known as:  LOPRESSOR  Take 1 tablet (25 mg total) by mouth 2 (two) times daily.     nitroGLYCERIN 0.4 MG SL tablet  Commonly known as:  NITROSTAT  Place 0.4 mg under the  tongue every 5 (five) minutes as needed for chest pain (x 3 pills daily).     ranitidine 150 MG tablet  Commonly known as:  ZANTAC  Take 1 tablet (150 mg total) by mouth 2 (two) times daily.        Discharge Instructions: Please refer to Patient Instructions section of EMR for full details.  Patient was counseled important signs and symptoms that should prompt return to medical care, changes in medications, dietary instructions, activity restrictions, and follow up appointments.   Follow-Up Appointments: Appointment with Dr. Benson NorwayScott Jenson on 1/10.  Campbell StallKaty Dodd Mayo, MD 06/27/2015, 6:09 PM PGY-1, [facility]

## 2015-06-27 NOTE — Progress Notes (Signed)
Family Medicine Teaching Service Daily Progress Note Intern Pager: [phone]  Patient name: Jenny Myers Kimmet Medical record number: [id number] Date of birth: 05/24/1991 Age: 25 y.o. Gender: female  Primary Care Provider: No PCP Per Patient Consultants: Cardiology Code Status: Parital- intubation but no chest compressions  Pt Overview and Major Events to Date:  1/8: Admitted to FMTS for ACS rule out  Assessment and Plan: Jenny Myers Chiappetta is a 25 y.o. female presenting with chest pain. 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   Dental pain: Orthopantogram showed periodontal lucency adjacent to the root of the "next to last" maxillary molar on the left, suggesting abscess. Per Pt, abscess starting draining this AM and she can taste the drainage. On exam, aphthous ulcer present on gum below L lower molar, no area of drainage.  - s/p dental block in the ED - Clindamycin 600mg  IV q8hrs - Dilaudid for pain.  - Dental consult today.  Chest pain, resolved: Most likely pericarditis versus musculoskeletal pain. Troponins negative x 3. EKG showing T wave inversions in the inferior leads. - ANA and RF pending - Nitroglycerin prn. Has not required any doses in 24 hours. - Ibuprofen prn - Continue home colchicine - Continue home Lisinopril 2.5mg , Imdur 30mg  daily, Lopressor 12.5mg  bid (home dose is 25mg )  - Telemetry  Hx of post partum STEMI: Last ECHO showing 45-50% EF with grade 1 DD.  - Continue Aspirin, Plavix, Imdur  GERD: Stable - Continue Zantac  FEN/GI: clear liquids, saline lock  Prophylaxis: lovenox   Disposition: Anticipate discharge home today or tomorrow.  Subjective:  Patient states that the abscess started draining this morning. She can taste the drainage. She states that her chest pain is completely gone, except when her tooth pain gets really bad.  Objective: Temp:   [98.2 F (36.8 C)-98.6 F (37 C)] 98.6 F (37 C) (01/09 0400) Pulse Rate:  [60-87] 86 (01/09 0542) Resp:  [19-20] 20 (01/09 0400) BP: (121-124)/(77-80) 124/80 mmHg (01/09 0542) SpO2:  [97 %-98 %] 97 % (01/09 0400) Weight:  [99 lb 4.8 oz (45.042 kg)] 99 lb 4.8 oz (45.042 kg) (01/09 0400) Physical Exam: General: No acute distress, sitting up in bed Eyes: Clear conjunctiva ENTM: Uvula midline, moist mucous membranes, tender to palpation on left lower mandible, aphthous ulcer present on the gum below her back L molar. Neck: Supple Cardiovascular: RRR, S1-S2, no murmurs, rubs, gallops Respiratory: Clear to auscultation bilaterally, normal effort, no crackles or wheezes Abdomen: Soft, nontender, nondistended, positive bowel sounds, no hepatomegaly MSK: Moves all freely, no edema Skin: No overlying rash or erythema Neuro: No gross deficits Psych: Alert and oriented  Laboratory:  Recent Labs Lab 06/25/15 2319  WBC 10.1  HGB 12.5  HCT 37.0  PLT 378    Recent Labs Lab 06/25/15 2319  NA 139  K 4.1  CL 110  CO2 19*  BUN 13  CREATININE 0.45  CALCIUM 9.3  GLUCOSE 86    Imaging/Diagnostic Tests: Orthopantogram (1/8): periodontal lucency adjacent to the root of the "next to last" maxillary molar on the left, suggesting abscess.  Campbell StallKaty Dodd Chanya Chrisley, MD 06/27/2015, 7:13 AM PGY-1, [facility] FPTS Intern pager: [phone], text pages welcome

## 2015-06-27 NOTE — Progress Notes (Signed)
I spoke with Dr. Kristin BruinsKulinski (Dentistry) who looked at [name]'s orthopantogram. He recommended that I speak with Dr. Benson NorwayScott Jenson with oral surgery for possible tooth extraction. I called Dr. Chipper HerbJenson's office who said that they will evaluate [name] in their office tomorrow. [name] needs to call their office in the morning to schedule an appointment. We were initially planning to discharge [name] today, but she was tearful when I spoke with her and said that she does not have a ride back over here tomorrow. She is requesting that she stay in the hospital tonight and then will walk over to Dr. Chipper HerbJenson's office tomorrow for her appointment. We will plan to discharge her tomorrow.  Willadean CarolKaty Mayo, MD PGY-1

## 2015-06-28 DIAGNOSIS — K047 Periapical abscess without sinus: Secondary | ICD-10-CM

## 2015-06-28 DIAGNOSIS — R6884 Jaw pain: Secondary | ICD-10-CM

## 2015-06-28 NOTE — Discharge Instructions (Signed)
You were hospitalized because you were having jaw and chest pain. We ran some tests and we do not think you are having another heart attack. We got an x-ray of your mouth, which showed an abscess. We are sending you to Dr. Ocie DoyneScott Jensen (oral surgeon) who will evaluate and treat you.

## 2015-06-28 NOTE — Progress Notes (Signed)
Family Medicine Teaching Service Daily Progress Note Intern Pager: [phone]  Patient name: Jenny HillockLatrice Knechtel Medical record number: [id number] Date of birth: 09/10/1990 Age: 25 y.o. Gender: female  Primary Care Provider: No PCP Per Patient Consultants: Cardiology Code Status: Parital- intubation but no chest compressions  Pt Overview and Major Events to Date:  1/8: Admitted to FMTS for ACS rule out  Assessment and Plan: Jenny Myers is a 25 y.o. female presenting with chest pain. 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   Periodontal abscess: On exam, no drainage present today. Afebrile overnight. - s/p dental block in the ED - Clindamycin 900mg  IV q8hrs - Dilaudid for pain.  - We have scheduled outpatient appointment with oral surgery today.  Chest pain, resolved: Most likely pericarditis versus musculoskeletal pain. ACS has been ruled out. - Nitroglycerin prn. Has not required any doses in 48 hours. - Ibuprofen prn - Continue home colchicine for hx of  - Continue home Lisinopril 2.5mg , Imdur 30mg  daily, Lopressor 12.5mg  bid (home dose is 25mg )  - Telemetry  Hx of post partum STEMI: Last ECHO showing 45-50% EF with grade 1 DD.  - Continue Aspirin, Plavix, Imdur  GERD: Stable - Continue Zantac  FEN/GI: clear liquids, saline lock  Prophylaxis: lovenox   Disposition: Discharge home today, with oral surgery appointment scheduled.  Subjective:  Patient feels much better today. Thinks her swelling has gone down.  Objective: Temp:  [97.5 F (36.4 C)-98.6 F (37 C)] 98.6 F (37 C) (01/10 0540) Pulse Rate:  [72-92] 89 (01/10 0540) Resp:  [20] 20 (01/10 0540) BP: (104-109)/(54-72) 109/72 mmHg (01/10 0540) SpO2:  [97 %-100 %] 100 % (01/10 0540) Weight:  [97 lb 9.6 oz (44.271 kg)] 97 lb 9.6 oz (44.271 kg) (01/10 0540) Physical Exam: General: No acute distress,  sitting up in bed Eyes: Clear conjunctiva ENTM: Uvula midline, moist mucous membranes, tender to palpation on left lower mandible, no drainage present. Neck: Supple Cardiovascular: RRR, S1-S2, no murmurs, rubs, gallops Respiratory: Clear to auscultation bilaterally, normal effort, no crackles or wheezes Abdomen: Soft, nontender, nondistended, positive bowel sounds, no hepatomegaly MSK: Moves all freely, no edema Skin: No overlying rash or erythema Neuro: No gross deficits Psych: Alert and oriented  Laboratory:  Recent Labs Lab 06/25/15 2319  WBC 10.1  HGB 12.5  HCT 37.0  PLT 378    Recent Labs Lab 06/25/15 2319  NA 139  K 4.1  CL 110  CO2 19*  BUN 13  CREATININE 0.45  CALCIUM 9.3  GLUCOSE 86    Imaging/Diagnostic Tests: ANA: negative RF: negative Orthopantogram (1/8): periodontal lucency adjacent to the root of the "next to last" maxillary molar on the left, suggesting abscess.  Campbell StallKaty Dodd Mayo, MD 06/28/2015, 7:24 AM PGY-1, [facility] FPTS Intern pager: [phone], text pages welcome

## 2015-11-07 ENCOUNTER — Inpatient Hospital Stay (HOSPITAL_COMMUNITY): Payer: Medicaid Other

## 2015-11-07 ENCOUNTER — Encounter (HOSPITAL_COMMUNITY): Payer: Self-pay | Admitting: *Deleted

## 2015-11-07 ENCOUNTER — Ambulatory Visit: Payer: Medicaid Other | Admitting: *Deleted

## 2015-11-07 ENCOUNTER — Inpatient Hospital Stay (HOSPITAL_COMMUNITY)
Admission: AD | Admit: 2015-11-07 | Discharge: 2015-11-08 | Disposition: A | Discharge status: Home / Self Care | Payer: Medicaid Other | Source: Ambulatory Visit | Attending: Obstetrics & Gynecology | Admitting: Obstetrics & Gynecology

## 2015-11-07 DIAGNOSIS — O4703 False labor before 37 completed weeks of gestation, third trimester: Secondary | ICD-10-CM | POA: Diagnosis not present

## 2015-11-07 DIAGNOSIS — Z87891 Personal history of nicotine dependence: Secondary | ICD-10-CM | POA: Insufficient documentation

## 2015-11-07 DIAGNOSIS — O0933 Supervision of pregnancy with insufficient antenatal care, third trimester: Secondary | ICD-10-CM | POA: Insufficient documentation

## 2015-11-07 DIAGNOSIS — Z880 Allergy status to penicillin: Secondary | ICD-10-CM | POA: Insufficient documentation

## 2015-11-07 DIAGNOSIS — O0990 Supervision of high risk pregnancy, unspecified, unspecified trimester: Secondary | ICD-10-CM

## 2015-11-07 DIAGNOSIS — Z7982 Long term (current) use of aspirin: Secondary | ICD-10-CM | POA: Insufficient documentation

## 2015-11-07 DIAGNOSIS — Z3A31 31 weeks gestation of pregnancy: Secondary | ICD-10-CM | POA: Diagnosis not present

## 2015-11-07 DIAGNOSIS — O0930 Supervision of pregnancy with insufficient antenatal care, unspecified trimester: Secondary | ICD-10-CM

## 2015-11-07 DIAGNOSIS — O479 False labor, unspecified: Secondary | ICD-10-CM

## 2015-11-07 DIAGNOSIS — O4290 Premature rupture of membranes, unspecified as to length of time between rupture and onset of labor, unspecified weeks of gestation: Secondary | ICD-10-CM

## 2015-11-07 LAB — URINALYSIS, ROUTINE W REFLEX MICROSCOPIC
Bilirubin Urine: NEGATIVE
Glucose, UA: NEGATIVE mg/dL
Hgb urine dipstick: NEGATIVE
Ketones, ur: NEGATIVE mg/dL
LEUKOCYTES UA: NEGATIVE
NITRITE: NEGATIVE
Protein, ur: NEGATIVE mg/dL
pH: 6.5 (ref 5.0–8.0)

## 2015-11-07 LAB — DIFFERENTIAL
BASOS PCT: 0 %
Basophils Absolute: 0 10*3/uL (ref 0.0–0.1)
EOS ABS: 0 10*3/uL (ref 0.0–0.7)
EOS PCT: 0 %
Lymphocytes Relative: 26 %
Lymphs Abs: 2.5 10*3/uL (ref 0.7–4.0)
MONO ABS: 0.4 10*3/uL (ref 0.1–1.0)
MONOS PCT: 4 %
Neutro Abs: 6.6 10*3/uL (ref 1.7–7.7)
Neutrophils Relative %: 70 %

## 2015-11-07 LAB — CBC
HCT: 29.3 % — ABNORMAL LOW (ref 36.0–46.0)
Hemoglobin: 9.4 g/dL — ABNORMAL LOW (ref 12.0–15.0)
MCH: 27.6 pg (ref 26.0–34.0)
MCHC: 32.1 g/dL (ref 30.0–36.0)
MCV: 86.2 fL (ref 78.0–100.0)
PLATELETS: 280 10*3/uL (ref 150–400)
RBC: 3.4 MIL/uL — AB (ref 3.87–5.11)
RDW: 15.4 % (ref 11.5–15.5)
WBC: 9.6 10*3/uL (ref 4.0–10.5)

## 2015-11-07 LAB — RAPID HIV SCREEN (HIV 1/2 AB+AG)
HIV 1/2 Antibodies: NONREACTIVE
HIV-1 P24 ANTIGEN - HIV24: NONREACTIVE

## 2015-11-07 LAB — RAPID URINE DRUG SCREEN, HOSP PERFORMED
AMPHETAMINES: NOT DETECTED
Barbiturates: NOT DETECTED
Benzodiazepines: NOT DETECTED
COCAINE: NOT DETECTED
OPIATES: NOT DETECTED
TETRAHYDROCANNABINOL: POSITIVE — AB

## 2015-11-07 LAB — AMNISURE RUPTURE OF MEMBRANE (ROM) NOT AT ARMC: Amnisure ROM: NEGATIVE

## 2015-11-07 LAB — POCT PREGNANCY, URINE: Preg Test, Ur: POSITIVE — AB

## 2015-11-07 LAB — TYPE AND SCREEN
ABO/RH(D): A POS
Antibody Screen: NEGATIVE

## 2015-11-07 MED ORDER — ZOLPIDEM TARTRATE 5 MG PO TABS
5.0000 mg | ORAL_TABLET | Freq: Once | ORAL | Status: AC
  Administered 2015-11-07: 5 mg via ORAL
  Filled 2015-11-07: qty 1

## 2015-11-07 MED ORDER — NIFEDIPINE 10 MG PO CAPS
10.0000 mg | ORAL_CAPSULE | Freq: Once | ORAL | Status: AC
  Administered 2015-11-07: 10 mg via ORAL
  Filled 2015-11-07: qty 1

## 2015-11-07 MED ORDER — BETAMETHASONE SOD PHOS & ACET 6 (3-3) MG/ML IJ SUSP
12.0000 mg | Freq: Once | INTRAMUSCULAR | Status: AC
  Administered 2015-11-07: 12 mg via INTRAMUSCULAR
  Filled 2015-11-07: qty 2

## 2015-11-07 MED ORDER — LACTATED RINGERS IV BOLUS (SEPSIS)
1000.0000 mL | Freq: Once | INTRAVENOUS | Status: AC
  Administered 2015-11-07: 1000 mL via INTRAVENOUS

## 2015-11-07 NOTE — MAU Note (Signed)
Pt presents with complaint of ? Leaking fluid and contractions.

## 2015-11-07 NOTE — Progress Notes (Signed)
Pt in for pregnancy test. Result is positive. Patient not sure of last period but has been feeling fetal movement for a while. Thinks she is at least 20 weeks. US ordered and patient advised to start prenatal care.

## 2015-11-07 NOTE — Discharge Instructions (Signed)
Braxton Hicks Contractions °Contractions of the uterus can occur throughout pregnancy. Contractions are not always a sign that you are in labor.  °WHAT ARE BRAXTON HICKS CONTRACTIONS?  °Contractions that occur before labor are called Braxton Hicks contractions, or false labor. Toward the end of pregnancy (32-34 weeks), these contractions can develop more often and may become more forceful. This is not true labor because these contractions do not result in opening (dilatation) and thinning of the cervix. They are sometimes difficult to tell apart from true labor because these contractions can be forceful and people have different pain tolerances. You should not feel embarrassed if you go to the hospital with false labor. Sometimes, the only way to tell if you are in true labor is for your health care provider to look for changes in the cervix. °If there are no prenatal problems or other health problems associated with the pregnancy, it is completely safe to be sent home with false labor and await the onset of true labor. °HOW CAN YOU TELL THE DIFFERENCE BETWEEN TRUE AND FALSE LABOR? °False Labor °· The contractions of false labor are usually shorter and not as hard as those of true labor.   °· The contractions are usually irregular.   °· The contractions are often felt in the front of the lower abdomen and in the groin.   °· The contractions may go away when you walk around or change positions while lying down.   °· The contractions get weaker and are shorter lasting as time goes on.   °· The contractions do not usually become progressively stronger, regular, and closer together as with true labor.   °True Labor °· Contractions in true labor last 30-70 seconds, become very regular, usually become more intense, and increase in frequency.   °· The contractions do not go away with walking.   °· The discomfort is usually felt in the top of the uterus and spreads to the lower abdomen and low back.   °· True labor can be  determined by your health care provider with an exam. This will show that the cervix is dilating and getting thinner.   °WHAT TO REMEMBER °· Keep up with your usual exercises and follow other instructions given by your health care provider.   °· Take medicines as directed by your health care provider.   °· Keep your regular prenatal appointments.   °· Eat and drink lightly if you think you are going into labor.   °· If Braxton Hicks contractions are making you uncomfortable:   °¨ Change your position from lying down or resting to walking, or from walking to resting.   °¨ Sit and rest in a tub of warm water.   °¨ Drink 2-3 glasses of water. Dehydration may cause these contractions.   °¨ Do slow and deep breathing several times an hour.   °WHEN SHOULD I SEEK IMMEDIATE MEDICAL CARE? °Seek immediate medical care if: °· Your contractions become stronger, more regular, and closer together.   °· You have fluid leaking or gushing from your vagina.   °· You have a fever.   °· You pass blood-tinged mucus.   °· You have vaginal bleeding.   °· You have continuous abdominal pain.   °· You have low back pain that you never had before.   °· You feel your baby's head pushing down and causing pelvic pressure.   °· Your baby is not moving as much as it used to.   °  °This information is not intended to replace advice given to you by your health care provider. Make sure you discuss any questions you have with your health care   provider. °  °Document Released: 06/04/2005 Document Revised: 06/09/2013 Document Reviewed: 03/16/2013 °[employer]vier Interactive Patient Education ©2016 [employer] ° °

## 2015-11-07 NOTE — MAU Provider Note (Signed)
History    CSN: [id number] Arrival date and time: 11/07/15 2038 First Provider Initiated Contact with Patient 11/07/15 2249     Chief Complaint  Patient presents with  . Contractions   HPI Patient is 25 y.o. [id number] [redacted]w[redacted]d here with complaints of contractions/leaking fluid but also unsure of gestational age. Reports a "period for 5 days every month." Her care is complicated by LAD dissection postpartum in 2016.  +FM, denies LOF, VB, contractions, vaginal discharge.   OB History    Gravida Para Term Preterm AB TAB SAB Ectopic Multiple Living   0 0 3      Past Medical History  Diagnosis Date  . Ectopic pregnancy 11/2010    Right salpingectomy  . Umbilical hernia   . Infection     chlamydia  . 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 Pericarditis 05/2015    Past Surgical History  Procedure Laterality Date  . Salpingectomy  2012    left ovary, ruptured ectopic  . Wisdom tooth extraction    . Induced abortion    . Cardiac catheterization N/A 11/17/2014    Procedure: Left Heart Cath and Coronary Angiography;  Surgeon: Lennette Bihari, MD;  Location: [facility] INVASIVE CV LAB;  Service: Cardiovascular;  Laterality: N/A;  . Cardiac catheterization N/A 11/17/2014    Procedure: Left Heart Cath and Coronary Angiography;  Surgeon: Lennette Bihari, MD;  Location: [facility] INVASIVE CV LAB;  Service: Cardiovascular;  Laterality: N/A;    Family History  Problem Relation Age of Onset  . Hypertension Mother   . Diabetes Father   . Hypertension Father   . Cancer Father     breast  . Vision loss Paternal Grandmother   . Other Neg Hx     Social History  Substance Use Topics  . Smoking status: Former Games developer  . Smokeless tobacco: Never Used     Comment: quit with preg  . Alcohol Use: No     Comment: not since pregnancy    Allergies:  Allergies  Allergen Reactions  . Garlic Anaphylaxis  . Penicillins Anaphylaxis and Nausea And Vomiting    Has  patient had a PCN reaction causing immediate rash, facial/tongue/throat swelling, SOB or lightheadedness with hypotension: Yes Has patient had a PCN reaction causing severe rash involving mucus membranes or skin necrosis: No Has patient had a PCN reaction that required hospitalization No Has patient had a PCN reaction occurring within the last 10 years: No If all of the above answers are "NO", then may proceed with Cephalosporin use.   . Carrot Flavor Hives  . Fentanyl Hives    Prescriptions prior to admission  Medication Sig Dispense Refill Last Dose  . aspirin EC 81 MG EC tablet Take 1 tablet (81 mg total) by mouth daily.   11/07/2015 at Unknown time  . clopidogrel (PLAVIX) 75 MG tablet Take 1 tablet (75 mg total) by mouth daily with breakfast. 30 tablet 11 11/07/2015 at Unknown time  . colchicine 0.6 MG tablet Take 1 tablet (0.6 mg total) by mouth 2 (two) times daily. 60 tablet 3 11/07/2015 at Unknown time  . isosorbide mononitrate (IMDUR) 30 MG 24 hr tablet Take 1 tablet (30 mg total) by mouth daily. 30 tablet 11 11/07/2015 at Unknown time  . lisinopril (ZESTRIL) 2.5 MG tablet Take 1 tablet (2.5 mg total) by mouth daily. 90 tablet 1 Past Week at Unknown time  . metoprolol tartrate (  LOPRESSOR) 25 MG tablet Take 1 tablet (25 mg total) by mouth 2 (two) times daily. 60 tablet 11 11/07/2015 at Unknown time  . Prenatal Vit-Fe Fumarate-FA (PRENATAL MULTIVITAMIN) TABS tablet Take 1 tablet by mouth daily at 12 noon.   11/07/2015 at Unknown time  . nitroGLYCERIN (NITROSTAT) 0.4 MG SL tablet Place 0.4 mg under the tongue every 5 (five) minutes as needed for chest pain (x 3 pills daily).   Emergency    Review of Systems  Constitutional: Negative for fever and chills.  Eyes: Negative for blurred vision and double vision.  Respiratory: Negative for cough and shortness of breath.   Cardiovascular: Negative for chest pain and orthopnea.  Gastrointestinal: Negative for nausea and vomiting.  Genitourinary:  Negative for dysuria, frequency and flank pain.  Musculoskeletal: Negative for myalgias.  Skin: Negative for rash.  Neurological: Negative for dizziness, tingling, weakness and headaches.  Endo/Heme/Allergies: Does not bruise/bleed easily.  Psychiatric/Behavioral: Negative for depression and suicidal ideas. The patient is not nervous/anxious.    Physical Exam   Blood pressure 105/65, pulse 60, temperature 98.2 F (36.8 C), temperature source Oral, resp. rate 20, height 5' (1.524 m), weight 117 lb (53.071 kg), last menstrual period 06/15/2015, SpO2 99 %, not currently breastfeeding.  Physical Exam  Nursing note and vitals reviewed. Constitutional: She is oriented to person, place, and time. She appears well-developed and well-nourished. No distress.  Pregnant female  HENT:  Head: Normocephalic and atraumatic.  Eyes: Conjunctivae are normal. No scleral icterus.  Neck: Normal range of motion. Neck supple.  Cardiovascular: Normal rate and intact distal pulses.   Respiratory: Effort normal. She exhibits no tenderness.  GI: Soft. There is no tenderness. There is no rebound and no guarding.  Gravid. Fundal height 31cm  Genitourinary: Vagina normal.  Musculoskeletal: Normal range of motion. She exhibits no edema.  Neurological: She is alert and oriented to person, place, and time.  Skin: Skin is warm and dry. No rash noted.  Psychiatric: She has a normal mood and affect.   Dilation: Closed Effacement (%): Thick Station:  (HIGH) Presentation: Vertex Exam by:: Dr Alvester MorinNewton   Repeat Exam is unchanged > 2 hrs from previous  MAU Course  Procedures  MDM NST-135/mod/+accels no decels  Results for orders placed or performed during the hospital encounter of 11/07/15 (from the past 24 hour(s))  Amnisure rupture of membrane (rom)not at [facility]     Status: None   Collection Time: 11/07/15  9:13 PM  Result Value Ref Range   Amnisure ROM NEGATIVE   CBC     Status: Abnormal   Collection Time:  11/07/15  9:25 PM  Result Value Ref Range   WBC 9.6 4.0 - 10.5 K/uL   RBC 3.40 (L) 3.87 - 5.11 MIL/uL   Hemoglobin 9.4 (L) 12.0 - 15.0 g/dL   HCT 16.129.3 (L) 09.636.0 - 04.546.0 %   MCV 86.2 78.0 - 100.0 fL   MCH 27.6 26.0 - 34.0 pg   MCHC 32.1 30.0 - 36.0 g/dL   RDW 40.915.4 81.111.5 - 91.415.5 %   Platelets 280 150 - 400 K/uL  Differential     Status: None   Collection Time: 11/07/15  9:25 PM  Result Value Ref Range   Neutrophils Relative % 70 %   Neutro Abs 6.6 1.7 - 7.7 K/uL   Lymphocytes Relative 26 %   Lymphs Abs 2.5 0.7 - 4.0 K/uL   Monocytes Relative 4 %   Monocytes Absolute 0.4 0.1 - 1.0 K/uL  Eosinophils Relative 0 %   Eosinophils Absolute 0.0 0.0 - 0.7 K/uL   Basophils Relative 0 %   Basophils Absolute 0.0 0.0 - 0.1 K/uL  Rapid HIV screen (HIV 1/2 Ab+Ag)     Status: None   Collection Time: 11/07/15  9:25 PM  Result Value Ref Range   HIV-1 P24 Antigen - HIV24 NON REACTIVE NON REACTIVE   HIV 1/2 Antibodies NON REACTIVE NON REACTIVE   Interpretation (HIV Ag Ab)      A non reactive test result means that HIV 1 or HIV 2 antibodies and HIV 1 p24 antigen were not detected in the specimen.  Type and screen     Status: None   Collection Time: 11/07/15  9:28 PM  Result Value Ref Range   ABO/RH(D) A POS    Antibody Screen NEG    Sample Expiration 11/10/2015   Urinalysis, Routine w reflex microscopic (not at [facility])     Status: Abnormal   Collection Time: 11/07/15 10:30 PM  Result Value Ref Range   Color, Urine YELLOW YELLOW   APPearance CLEAR CLEAR   Specific Gravity, Urine <1.005 (L) 1.005 - 1.030   pH 6.5 5.0 - 8.0   Glucose, UA NEGATIVE NEGATIVE mg/dL   Hgb urine dipstick NEGATIVE NEGATIVE   Bilirubin Urine NEGATIVE NEGATIVE   Ketones, ur NEGATIVE NEGATIVE mg/dL   Protein, ur NEGATIVE NEGATIVE mg/dL   Nitrite NEGATIVE NEGATIVE   Leukocytes, UA NEGATIVE NEGATIVE   US showed [redacted]w[redacted]d Placenta above cervical os AFI 11.66  Assessment and Plan   #Preterm contractions without cervical  dilation-- Deberah Pelton - s/p procardia with mild improvement - No cervical change - BMZ received, will return in 24 hours for second dose - Ambien for sleep provided  #No Prenatal care - obtained labs - US done to assess GA - has [facility] appt scheduled  Federico Flake 11/07/2015, 11:30 PM

## 2015-11-08 LAB — GC/CHLAMYDIA PROBE AMP (~~LOC~~) NOT AT ARMC
Chlamydia: NEGATIVE
NEISSERIA GONORRHEA: NEGATIVE

## 2015-11-08 LAB — RPR: RPR: NONREACTIVE

## 2015-11-08 LAB — HEPATITIS B SURFACE ANTIGEN: Hepatitis B Surface Ag: NEGATIVE

## 2015-11-08 NOTE — Progress Notes (Signed)
Pt to return Wednesday morning in MAU for second dose of betamethasone.  Her significant other is works tomorrow night and the earliest she can return is Wednesday- Okay per Dr Alvester MorinNewton.

## 2015-11-09 ENCOUNTER — Telehealth: Payer: Self-pay | Admitting: General Practice

## 2015-11-09 DIAGNOSIS — O0933 Supervision of pregnancy with insufficient antenatal care, third trimester: Secondary | ICD-10-CM

## 2015-11-09 DIAGNOSIS — I252 Old myocardial infarction: Secondary | ICD-10-CM

## 2015-11-09 LAB — RUBELLA SCREEN: Rubella: 1.99 index (ref >0.99)

## 2015-11-09 NOTE — Telephone Encounter (Signed)
Patient needs to d/c taking lisinopril ASAP. Patient also needs consult appt with MFM for cardiac history (MI last year). Patient also [facility] appt with [facility]. Scheduled appt with MFM for 5/26 @ 10am. Scheduled appt with [facility] for 5/30 @ 11:45am. Called patient no answer- left message on voicemail to return our call in regards to an appt.

## 2015-11-10 ENCOUNTER — Encounter (HOSPITAL_COMMUNITY): Payer: Self-pay

## 2015-11-10 NOTE — Telephone Encounter (Signed)
Called patient & informed her of all appts and locations. Also informed patient to immediately stop taking lisinopril. Patient verbalized understanding to all & had no questions

## 2015-11-11 ENCOUNTER — Other Ambulatory Visit (HOSPITAL_COMMUNITY): Payer: Self-pay

## 2015-11-11 ENCOUNTER — Encounter (HOSPITAL_COMMUNITY): Payer: Self-pay

## 2015-11-11 ENCOUNTER — Ambulatory Visit (HOSPITAL_COMMUNITY)
Admission: RE | Admit: 2015-11-11 | Discharge: 2015-11-11 | Disposition: A | Discharge status: Home / Self Care | Payer: Medicaid Other | Source: Ambulatory Visit | Attending: Obstetrics & Gynecology | Admitting: Obstetrics & Gynecology

## 2015-11-11 VITALS — BP 125/88 | HR 92 | Wt 116.5 lb

## 2015-11-11 DIAGNOSIS — I252 Old myocardial infarction: Secondary | ICD-10-CM | POA: Diagnosis not present

## 2015-11-11 DIAGNOSIS — Z3A32 32 weeks gestation of pregnancy: Secondary | ICD-10-CM | POA: Diagnosis not present

## 2015-11-11 DIAGNOSIS — I519 Heart disease, unspecified: Secondary | ICD-10-CM

## 2015-11-11 DIAGNOSIS — O26893 Other specified pregnancy related conditions, third trimester: Secondary | ICD-10-CM | POA: Diagnosis present

## 2015-11-11 DIAGNOSIS — O99413 Diseases of the circulatory system complicating pregnancy, third trimester: Secondary | ICD-10-CM

## 2015-11-11 HISTORY — DX: Acute myocardial infarction, unspecified: I21.9

## 2015-11-11 NOTE — Consult Note (Signed)
MFM Consult Staff Note  I spoke to your patient about her history of myocardial infarction (postpartum STEMI) secondary to LAD dissection 11/17/14 and my impression of what is needed to manage her current pregnancy.   Currently, she is followed by [facility] and had a 48% EF with no evidence of valvular insufficiency/stenosis, no pulmonary HTN, but had mild dysfunction of wall motion (hypokinesis) on her last echocardiogram in 2016.  Unfortunately, she was unaware of her pregnancy until recently and had been on the following medications:  aspirin, Plavix, cochicine, imdur, Lopressor, and nitrostat.  Upon learning of her pregnancy she was told to initiate prenatal vitamins and continue only the baby aspirin.  Today, her blood pressure is 125/58 and she reports only mild exertional dyspnea without any chest pain and that she is functioning better off of the medications subjectively.  She understands risk for both low birth weight as well as renal dysplasia given the medication exposure through the early and midtrimester of this gestation. I discussed that in the setting of prior existing heart disease (CAD) that she should only have initiation of antihypertensive medications at a threshold of 150/100.  For her this would represent severe range hypertension and be new onset (ie, no hx of CHTN) and would require delivery after 34 weeks due to hypertensive disorder of pregnancy with severe features, regardless of classification as severe preeclampsia or severe GHTN.    It is my practice and recommendation to assess maternal cardiac function early in gestation albeit her late presentation at 32 weeks and 2 days precludes such assessment.  That being said, she is scheduled for an evaluation at [facility] on Tuesday, Nov 15, 2015.  Formal assessment will require EKG and echocardiogram.  I explained to her she is increased risk for deterioration with the physiologic changes anticipated in the  late-trimester of pregnancy.  Accordingly, close follow up is usually recommended as a precaution. She should have serial assessments both clinically by physical examination and monitoring of symptoms during this later part of pregnancy. A multi-disciplinary approach replete with frequent assessments by both obsterics and cardiology will be needed. If she experiences symptoms of increasing dyspnea, chest pain, dizziness, fatigue, palpitations and/or sudden onset of edema, I explained to her that I desired she present to [place] for urgent evaluation during which we would facilitate a concurrent assessment by cardiology.   She demonstrated adequate understanding of her increased risk of both morbidity.  To moderate her risks in spite of having 3 prior vaginal deliveries, I recommend that she have anesthesia consultation to follow echocardiogram and cardiology evaluation.  She should have telemetry in labor, early epidural to abate risks of arrhythmia with labor pains.  Additionally, it would not be appropriate to have a prolonged effort with 2nd stage of labor; ie, she should have an assisted second stage with either forceps or vacuum.  As such, she should be allowed to labor, push, and have an assisted vaginal delivery.   Additionally, I would recommend anesthesia reconsultation prior to labor. A common time to arrange such a meeting would be around 34-36 weeks and at admission. An early epidural in labor with adequate preload as per cardiology/anesthesia recommendations would be a general recommendation. Ie, Maternal tachyarrhythmias are more common to onset in labor in surgically repaired hearts may be limited by avoiding pain response. Stress hormone release are best avoided, namely catecholamines, as they can trigger an unstable cardiac rhythm. Oftentimes, arrhythmias can lead to syncopal episodes or even death in context  of pain. Similarly, laboring and the associated pain response could trigger arrhythmia.  Large fluid shifts as with hemorrhage incurred during a cesarean delivery pose more risk for triggering arrhythmia/hypoxic tissue injury. While not commonly encountered and this patient is of low but appreciable risk, this condition is best managed obstetrically by placement of epidural early in labor to block the pain response release of catecholamines.   The maternal cardiac status can be closely and meticulously followed in labor by implementing telemetry monitoring of maternal heart rate and rhythm, continuous pulse oximetry, and close matching of fluid status (I/O's with adequate matching of hydration and urine output monitored by way of foley catheter). I recommend that the patient have an assessment by anesthesiology for placement of early epidural; this assessment in my opinion is best done as a planning visit sometime in the third trimester.   Assisted second stage is recommended for this patient unless obstetrical condition warrants consideration for cesarean. To avoid excessive maternal expulsive forces, I recommended to your patient we would have her "labor down" in the second stage until she feels the urge to push. Following delivery, I recommend continuing the telemetry for 24 hours and pulse oximetry for 12-24 hours. After this initial period of observation is without onset of CHF or arrhythmia, I would then simply obtain a pulse oximetry measurement routinely with vital signs every 4 hours until criteria for discharge are met.  Timed Delivery at 38-39 weeks for maternal heart disease may be the best option to avoid labor at home and to facilitate placement of early epidural. She may continue pregnancy until achieving term.   I additionally spoke to the patient regarding her needed fetal surveillance in setting of maternal CAD as follows:  twice weekly NST, weekly AFI, and interval growth all owing to risks for oligohydramnios, IUGR, and stillbirth.  I gave her preterm labor and preeclampsia  precautions along with instructions for monitoring fetal activity (kick counts).  Although this was a lot of information for her to digest, your patient demonstrated adequate comprehension of my impressions, recommendations and the underlying rationale. All of her questions were answered during the course of the consultation.  Summary of Recommendations for Maternal Surveillance: 1. I recommend assessing maternal heart function in pregnancy by echocardiogram as already arranged by her cardiologist in 4 days and again around 36-38 weeks.  She needs an EKG and Echo at her next cardiology appointment. 2. I recommend antenatal consultation with anesthesiology for their service's planning of early epidural to facilitate safe labor 3. Telemetry in labor and for 1st 24 hours postpartum. 4. Continuous pulse oximetry in labor and for 1st 12-24 hours postpartum. Then q 4 hours with vitals postpartum. 5. If spontaneous labor does not ensue prior to 38-39 weeks, then medically indicated induction would best facilitate early epidural and limit unnecessary risks for development of gestational complications that she is at increased risk with her history of CAD/MI (eg, IUGR, preeclampsia, stillbirth).  6. Assisted second stage (FAVD/VAVD) is recommended but cesarean delivery should be reserved for usual obstetrical indications. 7. Continue ASA '81mg'$  po qd 8. Interval fetal growth q3 weeks with antenatal testing in the interim. 9.  Follow discussion with Dr. Kennon Rounds, I am requesting transfer of care to [facility].  All appointments required are being scheduled through our office.  Time Spent: I spent in excess of 80 minutes in consultation with this patient to review records, evaluate her case, and provide her with an adequate discussion and education. More than 50% of  this time was spent in direct face-to-face counseling. It was a pleasure seeing your patient in the office today. Thank you for consultation. Please do not  hesitate to contact our service for any further questions.  Thank you,  Delman Cheadle Harl Favor, Delman Cheadle, MD, MS, FACOG Assistant Professor Section of [place]

## 2015-11-15 ENCOUNTER — Encounter (HOSPITAL_COMMUNITY): Payer: Self-pay

## 2015-11-15 ENCOUNTER — Ambulatory Visit (HOSPITAL_COMMUNITY)
Admission: RE | Admit: 2015-11-15 | Discharge: 2015-11-15 | Disposition: A | Discharge status: Home / Self Care | Payer: Medicaid Other | Source: Ambulatory Visit | Attending: Family Medicine | Admitting: Family Medicine

## 2015-11-15 ENCOUNTER — Encounter: Payer: Self-pay | Admitting: Internal Medicine

## 2015-11-15 ENCOUNTER — Ambulatory Visit (INDEPENDENT_AMBULATORY_CARE_PROVIDER_SITE_OTHER): Payer: Medicaid Other | Admitting: Internal Medicine

## 2015-11-15 VITALS — Ht 60.0 in

## 2015-11-15 DIAGNOSIS — Z3A32 32 weeks gestation of pregnancy: Secondary | ICD-10-CM | POA: Insufficient documentation

## 2015-11-15 DIAGNOSIS — O2690 Pregnancy related conditions, unspecified, unspecified trimester: Secondary | ICD-10-CM | POA: Diagnosis not present

## 2015-11-15 DIAGNOSIS — O0990 Supervision of high risk pregnancy, unspecified, unspecified trimester: Secondary | ICD-10-CM

## 2015-11-15 DIAGNOSIS — I2102 ST elevation (STEMI) myocardial infarction involving left anterior descending coronary artery: Secondary | ICD-10-CM

## 2015-11-15 DIAGNOSIS — I252 Old myocardial infarction: Secondary | ICD-10-CM | POA: Diagnosis present

## 2015-11-16 ENCOUNTER — Other Ambulatory Visit (HOSPITAL_COMMUNITY): Payer: Medicaid Other

## 2015-11-16 NOTE — Progress Notes (Signed)
Patient did not show for this appointment.  Chrystie NoseKenneth C. Leahanna Buser, MD, [facility] HospitalFACC Attending Cardiologist [facility]

## 2015-11-18 ENCOUNTER — Ambulatory Visit (HOSPITAL_COMMUNITY): Admission: RE | Admit: 2015-11-18 | Payer: Medicaid Other | Source: Ambulatory Visit

## 2015-11-21 ENCOUNTER — Encounter: Payer: Medicaid Other | Admitting: Obstetrics and Gynecology

## 2015-11-22 ENCOUNTER — Ambulatory Visit (HOSPITAL_COMMUNITY): Admission: RE | Admit: 2015-11-22 | Payer: Medicaid Other | Source: Ambulatory Visit

## 2015-11-22 ENCOUNTER — Ambulatory Visit (HOSPITAL_COMMUNITY): Payer: Medicaid Other

## 2015-11-23 ENCOUNTER — Ambulatory Visit (HOSPITAL_COMMUNITY)
Admission: RE | Admit: 2015-11-23 | Discharge: 2015-11-23 | Disposition: A | Discharge status: Home / Self Care | Payer: Medicaid Other | Source: Ambulatory Visit | Attending: Family Medicine | Admitting: Family Medicine

## 2015-11-23 ENCOUNTER — Encounter (HOSPITAL_COMMUNITY): Payer: Self-pay

## 2015-11-23 DIAGNOSIS — O2693 Pregnancy related conditions, unspecified, third trimester: Secondary | ICD-10-CM | POA: Insufficient documentation

## 2015-11-23 DIAGNOSIS — F1990 Other psychoactive substance use, unspecified, uncomplicated: Secondary | ICD-10-CM | POA: Insufficient documentation

## 2015-11-23 DIAGNOSIS — O99323 Drug use complicating pregnancy, third trimester: Secondary | ICD-10-CM | POA: Insufficient documentation

## 2015-11-23 DIAGNOSIS — I252 Old myocardial infarction: Secondary | ICD-10-CM

## 2015-11-23 DIAGNOSIS — I2102 ST elevation (STEMI) myocardial infarction involving left anterior descending coronary artery: Secondary | ICD-10-CM

## 2015-11-23 DIAGNOSIS — O0933 Supervision of pregnancy with insufficient antenatal care, third trimester: Secondary | ICD-10-CM | POA: Insufficient documentation

## 2015-11-23 DIAGNOSIS — Z3A34 34 weeks gestation of pregnancy: Secondary | ICD-10-CM | POA: Insufficient documentation

## 2015-11-25 ENCOUNTER — Ambulatory Visit (HOSPITAL_COMMUNITY): Admission: RE | Admit: 2015-11-25 | Payer: Medicaid Other | Source: Ambulatory Visit

## 2015-11-29 ENCOUNTER — Ambulatory Visit (HOSPITAL_COMMUNITY): Admission: RE | Admit: 2015-11-29 | Payer: Medicaid Other | Source: Ambulatory Visit

## 2015-11-29 ENCOUNTER — Encounter (HOSPITAL_COMMUNITY): Payer: Self-pay

## 2015-12-02 ENCOUNTER — Ambulatory Visit (HOSPITAL_COMMUNITY): Admission: RE | Admit: 2015-12-02 | Payer: Medicaid Other | Source: Ambulatory Visit

## 2015-12-06 ENCOUNTER — Other Ambulatory Visit (HOSPITAL_COMMUNITY): Payer: Self-pay | Admitting: Maternal and Fetal Medicine

## 2015-12-06 ENCOUNTER — Ambulatory Visit (HOSPITAL_COMMUNITY)
Admission: RE | Admit: 2015-12-06 | Discharge: 2015-12-06 | Disposition: A | Discharge status: Home / Self Care | Payer: Medicaid Other | Source: Ambulatory Visit | Attending: Family Medicine | Admitting: Family Medicine

## 2015-12-06 ENCOUNTER — Observation Stay (HOSPITAL_COMMUNITY)
Admission: AD | Admit: 2015-12-06 | Discharge: 2015-12-07 | Disposition: A | Discharge status: Home / Self Care | Payer: Medicaid Other | Source: Ambulatory Visit | Attending: Family Medicine | Admitting: Family Medicine

## 2015-12-06 ENCOUNTER — Other Ambulatory Visit: Payer: Self-pay

## 2015-12-06 ENCOUNTER — Other Ambulatory Visit (HOSPITAL_COMMUNITY): Payer: Self-pay | Admitting: Obstetrics and Gynecology

## 2015-12-06 ENCOUNTER — Encounter (HOSPITAL_COMMUNITY): Payer: Self-pay

## 2015-12-06 ENCOUNTER — Encounter (HOSPITAL_COMMUNITY): Payer: Self-pay | Admitting: *Deleted

## 2015-12-06 VITALS — BP 117/68 | HR 88 | Wt 121.1 lb

## 2015-12-06 DIAGNOSIS — O479 False labor, unspecified: Secondary | ICD-10-CM

## 2015-12-06 DIAGNOSIS — I252 Old myocardial infarction: Secondary | ICD-10-CM

## 2015-12-06 DIAGNOSIS — Z888 Allergy status to other drugs, medicaments and biological substances status: Secondary | ICD-10-CM | POA: Insufficient documentation

## 2015-12-06 DIAGNOSIS — O99613 Diseases of the digestive system complicating pregnancy, third trimester: Secondary | ICD-10-CM | POA: Insufficient documentation

## 2015-12-06 DIAGNOSIS — O2693 Pregnancy related conditions, unspecified, third trimester: Secondary | ICD-10-CM

## 2015-12-06 DIAGNOSIS — O0933 Supervision of pregnancy with insufficient antenatal care, third trimester: Secondary | ICD-10-CM

## 2015-12-06 DIAGNOSIS — Z7982 Long term (current) use of aspirin: Secondary | ICD-10-CM | POA: Diagnosis not present

## 2015-12-06 DIAGNOSIS — Z3A35 35 weeks gestation of pregnancy: Secondary | ICD-10-CM | POA: Diagnosis not present

## 2015-12-06 DIAGNOSIS — O0990 Supervision of high risk pregnancy, unspecified, unspecified trimester: Secondary | ICD-10-CM

## 2015-12-06 DIAGNOSIS — O4700 False labor before 37 completed weeks of gestation, unspecified trimester: Secondary | ICD-10-CM | POA: Diagnosis present

## 2015-12-06 DIAGNOSIS — O99323 Drug use complicating pregnancy, third trimester: Secondary | ICD-10-CM

## 2015-12-06 DIAGNOSIS — O9989 Other specified diseases and conditions complicating pregnancy, childbirth and the puerperium: Principal | ICD-10-CM | POA: Insufficient documentation

## 2015-12-06 DIAGNOSIS — O4703 False labor before 37 completed weeks of gestation, third trimester: Secondary | ICD-10-CM

## 2015-12-06 DIAGNOSIS — R079 Chest pain, unspecified: Secondary | ICD-10-CM | POA: Diagnosis not present

## 2015-12-06 DIAGNOSIS — I2102 ST elevation (STEMI) myocardial infarction involving left anterior descending coronary artery: Secondary | ICD-10-CM | POA: Diagnosis present

## 2015-12-06 DIAGNOSIS — Z3A36 36 weeks gestation of pregnancy: Secondary | ICD-10-CM | POA: Diagnosis not present

## 2015-12-06 DIAGNOSIS — Z880 Allergy status to penicillin: Secondary | ICD-10-CM | POA: Diagnosis not present

## 2015-12-06 DIAGNOSIS — O09893 Supervision of other high risk pregnancies, third trimester: Secondary | ICD-10-CM | POA: Insufficient documentation

## 2015-12-06 DIAGNOSIS — Z9102 Food additives allergy status: Secondary | ICD-10-CM | POA: Insufficient documentation

## 2015-12-06 DIAGNOSIS — K219 Gastro-esophageal reflux disease without esophagitis: Secondary | ICD-10-CM | POA: Diagnosis present

## 2015-12-06 DIAGNOSIS — Z87891 Personal history of nicotine dependence: Secondary | ICD-10-CM | POA: Insufficient documentation

## 2015-12-06 LAB — COMPREHENSIVE METABOLIC PANEL
ALK PHOS: 130 U/L — AB (ref 38–126)
ALT: 11 U/L — AB (ref 14–54)
AST: 23 U/L (ref 15–41)
Albumin: 3.1 g/dL — ABNORMAL LOW (ref 3.5–5.0)
Anion gap: 8 (ref 5–15)
BUN: 10 mg/dL (ref 6–20)
CALCIUM: 9 mg/dL (ref 8.9–10.3)
CHLORIDE: 106 mmol/L (ref 101–111)
CO2: 21 mmol/L — AB (ref 22–32)
CREATININE: 0.46 mg/dL (ref 0.44–1.00)
GFR calc non Af Amer: >60 mL/min (ref >60)
GLUCOSE: 75 mg/dL (ref 65–99)
Potassium: 3.8 mmol/L (ref 3.5–5.1)
SODIUM: 135 mmol/L (ref 135–145)
Total Bilirubin: 1.1 mg/dL (ref 0.3–1.2)
Total Protein: 6.8 g/dL (ref 6.5–8.1)

## 2015-12-06 LAB — CBC WITH DIFFERENTIAL/PLATELET
BASOS ABS: 0 10*3/uL (ref 0.0–0.1)
Basophils Relative: 0 %
EOS ABS: 0.1 10*3/uL (ref 0.0–0.7)
EOS PCT: 2 %
HCT: 30.3 % — ABNORMAL LOW (ref 36.0–46.0)
HEMOGLOBIN: 9.8 g/dL — AB (ref 12.0–15.0)
LYMPHS ABS: 1.9 10*3/uL (ref 0.7–4.0)
LYMPHS PCT: 31 %
MCH: 27.3 pg (ref 26.0–34.0)
MCHC: 32.3 g/dL (ref 30.0–36.0)
MCV: 84.4 fL (ref 78.0–100.0)
Monocytes Absolute: 0.3 10*3/uL (ref 0.1–1.0)
Monocytes Relative: 6 %
NEUTROS PCT: 61 %
Neutro Abs: 3.8 10*3/uL (ref 1.7–7.7)
PLATELETS: 242 10*3/uL (ref 150–400)
RBC: 3.59 MIL/uL — AB (ref 3.87–5.11)
RDW: 16.1 % — ABNORMAL HIGH (ref 11.5–15.5)
WBC: 6.1 10*3/uL (ref 4.0–10.5)

## 2015-12-06 LAB — TYPE AND SCREEN
ABO/RH(D): A POS
Antibody Screen: NEGATIVE

## 2015-12-06 LAB — TROPONIN I: Troponin I: <0.03 ng/mL (ref <0.031)

## 2015-12-06 LAB — BRAIN NATRIURETIC PEPTIDE: B Natriuretic Peptide: 135.2 pg/mL — ABNORMAL HIGH (ref 0.0–100.0)

## 2015-12-06 LAB — PROTIME-INR
INR: 0.94 (ref 0.00–1.49)
Prothrombin Time: 12.8 seconds (ref 11.6–15.2)

## 2015-12-06 MED ORDER — ACETAMINOPHEN 325 MG PO TABS: 650.0000 mg | ORAL_TABLET | ORAL | Status: DC | PRN

## 2015-12-06 MED ORDER — PRENATAL MULTIVITAMIN CH
1.0000 | ORAL_TABLET | Freq: Every day | ORAL | Status: DC
  Administered 2015-12-07: 1 via ORAL
  Filled 2015-12-06: qty 1

## 2015-12-06 MED ORDER — CALCIUM CARBONATE ANTACID 500 MG PO CHEW: 2.0000 | CHEWABLE_TABLET | ORAL | Status: DC | PRN

## 2015-12-06 MED ORDER — OXYCODONE-ACETAMINOPHEN 5-325 MG PO TABS
1.0000 | ORAL_TABLET | Freq: Four times a day (QID) | ORAL | Status: DC | PRN
  Administered 2015-12-06 – 2015-12-07 (×2): 2 via ORAL
  Filled 2015-12-06 (×2): qty 2

## 2015-12-06 MED ORDER — DOCUSATE SODIUM 100 MG PO CAPS
100.0000 mg | ORAL_CAPSULE | Freq: Every day | ORAL | Status: DC
  Administered 2015-12-07: 100 mg via ORAL
  Filled 2015-12-06: qty 1

## 2015-12-06 MED ORDER — ASPIRIN 81 MG PO CHEW
324.0000 mg | CHEWABLE_TABLET | Freq: Once | ORAL | Status: AC
  Administered 2015-12-06: 324 mg via ORAL
  Filled 2015-12-06 (×2): qty 4

## 2015-12-06 MED ORDER — NIFEDIPINE 10 MG PO CAPS
10.0000 mg | ORAL_CAPSULE | Freq: Three times a day (TID) | ORAL | Status: DC
  Administered 2015-12-06 – 2015-12-07 (×3): 10 mg via ORAL
  Filled 2015-12-06 (×3): qty 1

## 2015-12-06 MED ORDER — CLOPIDOGREL BISULFATE 75 MG PO TABS
300.0000 mg | ORAL_TABLET | Freq: Once | ORAL | Status: AC
  Administered 2015-12-06: 300 mg via ORAL
  Filled 2015-12-06 (×2): qty 4

## 2015-12-06 MED ORDER — SODIUM CHLORIDE 0.9 % IV SOLN
INTRAVENOUS | Status: DC
  Administered 2015-12-06 – 2015-12-07 (×3): via INTRAVENOUS

## 2015-12-06 MED ORDER — NITROGLYCERIN 0.4 MG SL SUBL
0.4000 mg | SUBLINGUAL_TABLET | SUBLINGUAL | Status: DC | PRN
  Administered 2015-12-06 – 2015-12-07 (×6): 0.4 mg via SUBLINGUAL
  Filled 2015-12-06 (×5): qty 1

## 2015-12-06 MED ORDER — ZOLPIDEM TARTRATE 5 MG PO TABS
5.0000 mg | ORAL_TABLET | Freq: Every evening | ORAL | Status: DC | PRN
  Administered 2015-12-06: 5 mg via ORAL
  Filled 2015-12-06: qty 1

## 2015-12-06 NOTE — ED Notes (Signed)
Patient c/o chest pain during NST. Dr. Sherrie Georgeecker notified. Dr. Sherrie Georgeecker in to see patient  and sent her to MAU for chest pain to be evaluated.

## 2015-12-06 NOTE — MAU Note (Signed)
Pt. Was down in Mfm and started complaining of chest pain. Pt. States that she is feeling pressure and tightening in her chest and tingling in the left arm.

## 2015-12-06 NOTE — ED Notes (Signed)
[facility].  Pt to directly to room 7 in MAU via wheelchair.

## 2015-12-06 NOTE — H&P (Signed)
Jenny Myers is an 25 y.o. [phone] female.   Chief Complaint: Chest Pain  HPI:  This is a new problem. The current episode started today. The onset quality is gradual. The problem occurs constantly. The problem has been unchanged. The pain is present in the lateral region. The pain is moderate. The quality of the pain is described as heavy and tightness. The pain radiates to the left shoulder. Associated symptoms include abdominal pain.   Jenny Myers is a [id number] @ 4377w6d gestation who presents to the MAU with chest pain and abdominal pain that started while she was here for a routine visit in MFM. Patient reports that the abdominal pain and chest pain started at the same time. She reports that the abdominal pain feels like contractions and the chest pain feels similar to when she had an MI after her last delivery. Patient rates the pain as 8/10 and describes the chest pain as a heavy pressure feeling that is on the left side and radiates to the left arm. Patient denies n/v.   Patient reports that she was taking cardiac drugs and Plavix but stopped taking all her medication last month.  Past Medical History  Diagnosis Date  . Ectopic pregnancy 11/2010    Right salpingectomy  . Umbilical hernia   . Infection     chlamydia  . 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 Pericarditis 05/2015  . Myocardial infarction [facility](HCC)     Past Surgical History  Procedure Laterality Date  . Salpingectomy  2012    left ovary, ruptured ectopic  . Wisdom tooth extraction    . Induced abortion    . Cardiac catheterization N/A 11/17/2014    Procedure: Left Heart Cath and Coronary Angiography;  Surgeon: Lennette Biharihomas A Kelly, MD;  [facility];  Service: Cardiovascular;  Laterality: N/A;  . Cardiac catheterization N/A 11/17/2014    Procedure: Left Heart Cath and Coronary Angiography;  Surgeon: Lennette Biharihomas A Kelly, MD;  Location: [facility] INVAS[facility] CV LAB;  Service:  Cardiovascular;  Laterality: N/A;    Family History  Problem Relation Age of Onset  . Hypertension Mother   . Diabetes Father   . Hypertension Father   . Cancer Father     breast  . Vision loss Paternal Grandmother   . Other Neg Hx    Social History:  reports that she has quit smoking. She has never used smokeless tobacco. She reports that she does not drink alcohol or use illicit drugs.  Allergies:  Allergies  Allergen Reactions  . Garlic Anaphylaxis  . Penicillins Anaphylaxis and Nausea And Vomiting    Has patient had a PCN reaction causing immediate rash, facial/tongue/throat swelling, SOB or lightheadedness with hypotension: Yes Has patient had a PCN reaction causing severe rash involving mucus membranes or skin necrosis: No Has patient had a PCN reaction that required hospitalization No Has patient had a PCN reaction occurring within the last 10 years: No If all of the above answers are "NO", then may proceed with Cephalosporin use.   . Carrot Flavor Hives  . Fentanyl Hives    No current facility-administered medications on file prior to encounter.   Current Outpatient Prescriptions on File Prior to Encounter  Medication Sig Dispense Refill  . aspirin EC 81 MG EC tablet Take 1 tablet (81 mg total) by mouth daily.    . Prenatal Vit-Fe Fumarate-FA (PRENATAL MULTIVITAMIN) TABS tablet Take 1 tablet by mouth daily at 12 noon.     .Marland Kitchen  nitroGLYCERIN (NITROSTAT) 0.4 MG SL tablet Place 0.4 mg under the tongue every 5 (five) minutes as needed for chest pain (x 3 pills daily). Reported on 11/15/2015     Review of Systems  Cardiovascular: Positive for chest pain.  Gastrointestinal: Positive for abdominal pain.   All other systems negative except as stated in HPI   Blood pressure 124/62, pulse 73, temperature 98.4 F (36.9 C), temperature source Oral, resp. rate 20, height  (1.575 m), last menstrual period 06/15/2015, SpO2 97 %, not currently breastfeeding. Physical Exam   Constitutional: She is oriented to person, place, and time. She appears well-developed and well-nourished. No distress.  HENT:  Head: Normocephalic.  Eyes: EOM are normal.  Neck: Trachea normal and normal range of motion. Neck supple. Normal carotid pulses and no JVD present. Carotid bruit is not present.  Cardiovascular: Normal rate and regular rhythm.  Pulmonary/Chest: Effort normal. No respiratory distress. She has no wheezes. She has no rales.  Abdominal: Soft. There is no tenderness.  Genitourinary:  Dilation: 1.5 Effacement (%): 50 Cervical Position: Posterior Station: Ballotable Presentation: Undeterminable Exam by:: Claud Kelp RN  Musculoskeletal: Normal range of motion. She exhibits no edema.  Neurological: She is alert and oriented to person, place, and time. No cranial nerve deficit.  Skin: Skin is warm and dry.  Psychiatric: She has a normal mood and affect. Her behavior is normal.  Nursing note and vitals reviewed.   Lab Results  Component Value Date   WBC 6.1 12/06/2015   HGB 9.8* 12/06/2015   HCT 30.3* 12/06/2015   MCV 84.4 12/06/2015   PLT 242 12/06/2015   Lab Results  Component Value Date   PREGTESTUR POSITIVE* 11/07/2015   Troponin I <0.031 ng/mL <0.03       B Natriuretic Peptide 0.0 - 100.0 pg/mL 135.2 (H)       Prothrombin Time 11.6 - 15.2 seconds 12.8   INR 0.00 - 1.49  0.94        Sodium 135 - 145 mmol/L 135   Potassium 3.5 - 5.1 mmol/L 3.8   Chloride 101 - 111 mmol/L 106   CO2 22 - 32 mmol/L 21 (L)   Glucose, Bld 65 - 99 mg/dL 75   BUN 6 - 20 mg/dL 10   Creatinine, Ser 1.61 - 1.00 mg/dL 0.96   Calcium 8.9 - 04.5 mg/dL 9.0   Total Protein 6.5 - 8.1 g/dL 6.8   Albumin 3.5 - 5.0 g/dL 3.1 (L)   AST 15 - 41 U/L 23   ALT 14 - 54 U/L 11 (L)   Alkaline Phosphatase 38 - 126 U/L 130 (H)   Total Bilirubin 0.3 - 1.2 mg/dL 1.1        Opiates NONE DETECTED  NONE DETECTED   Cocaine NONE DETECTED  NONE DETECTED    Benzodiazepines NONE DETECTED  NONE DETECTED   Amphetamines NONE DETECTED  NONE DETECTED   Tetrahydrocannabinol NONE DETECTED  POSITIVE (A)   Barbiturates NONE DETECTED  NONE DETECTED           Assessment/Plan Active Problems:   Chest pain  Admit for 24 hours CP r/o MI. Serial EKG and Troponins. ASA, NTG, Procardia, Plavix given. Patient also has contractions--will monitor for s/sx's of PTL.    Stellah Donovan S 12/06/2015, 8:23 PM

## 2015-12-06 NOTE — MAU Note (Signed)
Pt. Still complaining of chest pain rated at a 10.  2nd dose of nitro given.

## 2015-12-06 NOTE — MAU Note (Signed)
Pt. Complaining of uc's that started about the same time as the chest pain.  Pt. Denies any bleeding of fluid.

## 2015-12-06 NOTE — MAU Provider Note (Signed)
CSN: [id number]     Arrival date & time 12/06/15  1721 History   None    Chief Complaint  Patient presents with  . Chest Pain     (Consider location/radiation/quality/duration/timing/severity/associated sxs/prior Treatment) Chest Pain  This is a new problem. The current episode started today. The onset quality is gradual. The problem occurs constantly. The problem has been unchanged. The pain is present in the lateral region. The pain is moderate. The quality of the pain is described as heavy and tightness. The pain radiates to the left shoulder. Associated symptoms include abdominal pain.   Jenny Myers is a [id number] @ 4660w6d gestation who presents to the MAU with chest pain and abdominal pain that started while she was here for a routine visit in MFM. Patient reports that the abdominal pain and chest pain started at the same time. She reports that the abdominal pain feels like contractions and the chest pain feels similar to when she had an MI after her last delivery. Patient rates the pain as 8/10 and describes the chest pain as a heavy pressure feeling that is on the left side and radiates to the left arm. Patient denies n/v.   Patient reports that she was taking cardiac drugs and Plavix but stopped taking all her medication last month.   Past Medical History  Diagnosis Date  . Ectopic pregnancy 11/2010    Right salpingectomy  . Umbilical hernia   . Infection     chlamydia  . 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 Pericarditis 05/2015  . Myocardial infarction [facility](HCC)    Past Surgical History  Procedure Laterality Date  . Salpingectomy  2012    left ovary, ruptured ectopic  . Wisdom tooth extraction    . Induced abortion    . Cardiac catheterization N/A 11/17/2014    Procedure: Left Heart Cath and Coronary Angiography;  Surgeon: Lennette Biharihomas A Kelly, MD;  [facility];  Service: Cardiovascular;  Laterality: N/A;  . Cardiac  catheterization N/A 11/17/2014    Procedure: Left Heart Cath and Coronary Angiography;  Surgeon: Lennette Biharihomas A Kelly, MD;  Location: [facility] CV LAB;  Service: Cardiovascular;  Laterality: N/A;   Family History  Problem Relation Age of Onset  . Hypertension Mother   . Diabetes Father   . Hypertension Father   . Cancer Father     breast  . Vision loss Paternal Grandmother   . Other Neg Hx    Social History  Substance Use Topics  . Smoking status: Former Games developermoker  . Smokeless tobacco: Never Used     Comment: quit with preg  . Alcohol Use: No     Comment: not since pregnancy   OB History    Gravida Para Term Preterm AB TAB SAB Ectopic Multiple Living   7 3 3  0 3 1 1 1  0 3     Review of Systems  Cardiovascular: Positive for chest pain.  Gastrointestinal: Positive for abdominal pain.   All other systems negative except as stated in HPI   Allergies  Garlic; Penicillins; Carrot flavor; and Fentanyl  Home Medications   Prior to Admission medications   Medication Sig Start Date End Date Taking? Authorizing Provider  aspirin EC 81 MG EC tablet Take 1 tablet (81 mg total) by mouth daily. 11/22/14  Yes Dwana MelenaBryan W Hager, PA-C  Prenatal Vit-Fe Fumarate-FA (PRENATAL MULTIVITAMIN) TABS tablet Take 1 tablet by mouth daily at 12 noon.  Yes Historical Provider, MD  nitroGLYCERIN (NITROSTAT) 0.4 MG SL tablet Place 0.4 mg under the tongue every 5 (five) minutes as needed for chest pain (x 3 pills daily). Reported on 11/15/2015    Historical Provider, MD   BP 124/62 mmHg  Pulse 73  Temp(Src) 98.4 F (36.9 C) (Oral)  Resp 20  Ht  (1.575 m)  SpO2 97%  LMP 06/15/2015 (Exact Date) Physical Exam  Constitutional: She is oriented to person, place, and time. She appears well-developed and well-nourished. No distress.  HENT:  Head: Normocephalic.  Eyes: EOM are normal.  Neck: Trachea normal and normal range of motion. Neck supple. Normal carotid pulses and no JVD present. Carotid bruit is not  present.  Cardiovascular: Normal rate and regular rhythm.   Pulmonary/Chest: Effort normal. No respiratory distress. She has no wheezes. She has no rales.  Abdominal: Soft. There is no tenderness.  Genitourinary:  Dilation: 1.5 Effacement (%): 50 Cervical Position: Posterior Station: Ballotable Presentation: Undeterminable Exam by:: Claud Kelp RN   Musculoskeletal: Normal range of motion. She exhibits no edema.  Neurological: She is alert and oriented to person, place, and time. No cranial nerve deficit.  Skin: Skin is warm and dry.  Psychiatric: She has a normal mood and affect. Her behavior is normal.  Nursing note and vitals reviewed.   ED Course  Procedures (including critical care time) Cardiac monitor, labs, EKG, NTG, ASA  EFM, procardia   Labs Review Labs Reviewed  BRAIN NATRIURETIC PEPTIDE - Abnormal; Notable for the following:    B Natriuretic Peptide 135.2 (*)    All other components within normal limits  CBC WITH DIFFERENTIAL/PLATELET - Abnormal; Notable for the following:    RBC 3.59 (*)    Hemoglobin 9.8 (*)    HCT 30.3 (*)    RDW 16.1 (*)    All other components within normal limits  COMPREHENSIVE METABOLIC PANEL - Abnormal; Notable for the following:    CO2 21 (*)    Albumin 3.1 (*)    ALT 11 (*)    Alkaline Phosphatase 130 (*)    All other components within normal limits  TROPONIN I  PROTIME-INR  TROPONIN I  TROPONIN I  TYPE AND SCREEN    Imaging Review [place] Mfm Fetal Bpp Wo Non Stress  12/06/2015  OBSTETRICAL ULTRASOUND: This exam was performed within a [place] [facility] Department. The OB US report was generated in the AS system, and faxed to the ordering physician.  This report is available in the [employer]. See the AS Obstetric US report via the Image Link.  [place] Mfm Ob Follow Up  12/06/2015  OBSTETRICAL ULTRASOUND: This exam was performed within a [place] [facility]trasound Department. The OB US report was generated in the AS system, and faxed  to the ordering physician.  This report is available in the [employer]. See the AS Obstetric US report via the Image Link.  I have personally reviewed and evaluated these images and lab results as part of my medical decision-making.  6:50 PM patient's cardiologist returned call and states that patient's EKG is unchanged from last EKG in Jan. 2017. Continue to monitor and review troponin I results.    MDM  25 y.o. female with hx of MI s/p delivery one year ago and here today with chest pain and contractions. Will admit for observation and continued monitoring.   Final diagnoses:  Chest pain, unspecified chest pain type  Braxton Hick's contraction

## 2015-12-07 ENCOUNTER — Other Ambulatory Visit: Payer: Self-pay

## 2015-12-07 ENCOUNTER — Observation Stay (HOSPITAL_BASED_OUTPATIENT_CLINIC_OR_DEPARTMENT_OTHER): Payer: Medicaid Other

## 2015-12-07 DIAGNOSIS — R0789 Other chest pain: Secondary | ICD-10-CM | POA: Diagnosis not present

## 2015-12-07 DIAGNOSIS — R079 Chest pain, unspecified: Secondary | ICD-10-CM | POA: Diagnosis not present

## 2015-12-07 DIAGNOSIS — I2542 Coronary artery dissection: Secondary | ICD-10-CM

## 2015-12-07 DIAGNOSIS — O479 False labor, unspecified: Secondary | ICD-10-CM | POA: Diagnosis present

## 2015-12-07 DIAGNOSIS — O4700 False labor before 37 completed weeks of gestation, unspecified trimester: Secondary | ICD-10-CM | POA: Diagnosis present

## 2015-12-07 LAB — ECHOCARDIOGRAM COMPLETE
E decel time: 194 msec
E/e' ratio: 6.35
FS: 27 % — AB (ref 28–44)
Height: 60 in
IVS/LV PW RATIO, ED: 0.98
LA ID, A-P, ES: 35 mm
LA diam end sys: 35 mm
LA diam index: 2.32 cm/m2
LA vol A4C: 49.4 ml
LA vol index: 35 mL/m2
LAVOL: 52.8 mL
LDCA: 3.14 cm2
LV E/e' medial: 6.35
LV E/e'average: 6.35
LV e' LATERAL: 17 cm/s
LVOT diameter: 20 mm
MV Dec: 194
MV pk A vel: 97 m/s
MVPG: 5 mmHg
MVPKEVEL: 108 m/s
PW: 10.5 mm — AB (ref 0.6–1.1)
TDI e' lateral: 17
TDI e' medial: 9.03
WEIGHTICAEL: 1936 [oz_av]

## 2015-12-07 LAB — TROPONIN I: Troponin I: <0.03 ng/mL (ref <0.031)

## 2015-12-07 MED ORDER — ISOSORBIDE MONONITRATE ER 30 MG PO TB24: 15.0000 mg | ORAL_TABLET | Freq: Every day | ORAL | Status: DC

## 2015-12-07 MED ORDER — PANTOPRAZOLE SODIUM 40 MG PO TBEC: 40.0000 mg | DELAYED_RELEASE_TABLET | Freq: Every day | ORAL | Status: DC

## 2015-12-07 MED ORDER — MORPHINE SULFATE (PF) 10 MG/ML IV SOLN
2.0000 mg | INTRAVENOUS | Status: DC | PRN
  Administered 2015-12-07 (×6): 2 mg via INTRAVENOUS
  Filled 2015-12-07 (×5): qty 1

## 2015-12-07 MED ORDER — ISOSORBIDE MONONITRATE 15 MG HALF TABLET
15.0000 mg | ORAL_TABLET | Freq: Every day | ORAL | Status: DC
  Administered 2015-12-07: 15 mg via ORAL
  Filled 2015-12-07 (×2): qty 1

## 2015-12-07 MED ORDER — PANTOPRAZOLE SODIUM 40 MG PO TBEC
40.0000 mg | DELAYED_RELEASE_TABLET | Freq: Every day | ORAL | Status: DC
  Administered 2015-12-07: 40 mg via ORAL
  Filled 2015-12-07: qty 1

## 2015-12-07 NOTE — Progress Notes (Signed)
Patient d/c'd home with instruction for follow-up appointments, self care, and medications. Patient verbalizes and understanding. Jenny Myers

## 2015-12-07 NOTE — Consult Note (Addendum)
Patient ID: Jenny Myers MRN: [id number], DOB/AGE: 13-Apr-1991   Admit date: 12/06/2015   Primary Physician: No PCP Per Patient Primary Cardiologist: Dr. Delton See (Patient lost to f/u)  Pt. Profile:  Jenny Myers is a 25 y.o. 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 EF 45-50% w/ inf WMA. Now [id number] @ 36w gestation admitted to the [facility] ICU for chest pain.   Problem List  Past Medical History  Diagnosis Date  . Ectopic pregnancy 11/2010    Right salpingectomy  . Umbilical hernia   . Infection     chlamydia  . 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 Pericarditis 05/2015  . Myocardial infarction [facility])     Past Surgical History  Procedure Laterality Date  . Salpingectomy  2012    left ovary, ruptured ectopic  . Wisdom tooth extraction    . Induced abortion    . Cardiac catheterization N/A 11/17/2014    Procedure: Left Heart Cath and Coronary Angiography;  Surgeon: Lennette Bihari, MD;  Location: [facility] INVASIVE CV LAB;  Service: Cardiovascular;  Laterality: N/A;  . Cardiac catheterization N/A 11/17/2014    Procedure: Left Heart Cath and Coronary Angiography;  Surgeon: Lennette Bihari, MD;  Location: [facility] CV LAB;  Service: Cardiovascular;  Laterality: N/A;     Allergies  Allergies  Allergen Reactions  . Garlic Anaphylaxis  . Penicillins Anaphylaxis and Nausea And Vomiting    Has patient had a PCN reaction causing immediate rash, facial/tongue/throat swelling, SOB or lightheadedness with hypotension: Yes Has patient had a PCN reaction causing severe rash involving mucus membranes or skin necrosis: No Has patient had a PCN reaction that required hospitalization No Has patient had a PCN reaction occurring within the last 10 years: No If all of the above answers are "NO", then may proceed with Cephalosporin use.   . Carrot Flavor Hives  . Fentanyl Hives    HPI  Jenny Myers is a 25 y.o. [id number] female @ 67 gestation admitted to the [facility] ICU for chest pain.   She has a history of STEMI after vaginal delivery (11/17/14) 2/2 spontaneous coronary dissection of the LAD. The cath films were reviewed by several cardiologists with the plan for medical therapy. Troponin was >65. Echo revealed EF of 45-50% with hypokinesis of the basal inferiormyocardium, G1DD. Aggrastat and heparin drip were stopped and she was brought back to the lab for IVUS. She did not undergo intervention and was briefly reviewed for bypass, but medical therapy was recommended as she had poor surgical targets. Her chest pain eventually resolved. She was treated with ASA + Plavix. She was lost to f/u as she did not show to her clinic appointments. She was suppose to f/u with Dr. Delton See. She reports that she was taking ASA and Plavix together up until recently. She was told by a provider that she could stop her Plavix 10/2015. She reports full compliance with ASA however.   She notes her recent CP started about a month ago, but has been occuring off and on. Yesterday she developed severe prolonged pain, prompting her to report to the hospital. The discomfort is similar to her prior CP associated to her SCAD in 2016. Left sided pressure, radiating to her left arm. Also with associated dyspnea and diaphoresis. No improvement with morphine, nor percocet. Currently with off and on pain, 8/10 at it's worse. BP and HR are  both stable/ well controlled. Systolic pressure in the in low 100s-110s.   EKGs show inferolateral TWIs that are nonspecific/ somewhat improved from prior EKGs.  No ST elevations are noted. Cardiac enzymes are negative x 3.     Home Medications  Prior to Admission medications   Medication Sig Start Date End Date Taking? Authorizing Provider  aspirin EC 81 MG EC tablet Take 1 tablet (81 mg total) by mouth daily. 11/22/14  Yes Dwana Melena, PA-C  Prenatal Vit-Fe Fumarate-FA  (PRENATAL MULTIVITAMIN) TABS tablet Take 1 tablet by mouth daily at 12 noon.    Yes Historical Provider, MD  nitroGLYCERIN (NITROSTAT) 0.4 MG SL tablet Place 0.4 mg under the tongue every 5 (five) minutes as needed for chest pain (x 3 pills daily). Reported on 11/15/2015    Historical Provider, MD    Family History  Family History  Problem Relation Age of Onset  . Hypertension Mother   . Diabetes Father   . Hypertension Father   . Cancer Father     breast  . Vision loss Paternal Grandmother   . Other Neg Hx     Social History  Social History   Social History  . Marital Status: Single    Spouse Name: N/A  . Number of Children: N/A  . Years of Education: N/A   Occupational History  . Not on file.   Social History Main Topics  . Smoking status: Former Games developer  . Smokeless tobacco: Never Used     Comment: quit with preg  . Alcohol Use: No     Comment: not since pregnancy  . Drug Use: No  . Sexual Activity: Not Currently    Birth Control/ Protection: None   Other Topics Concern  . Not on file   Social History Narrative     Review of Systems General:  No chills, fever, night sweats or weight changes.  Cardiovascular:  + chest pain, + dyspnea on exertion, + edema, no orthopnea, palpitations, paroxysmal nocturnal dyspnea. Dermatological: No rash, lesions/masses Respiratory: No cough, dyspnea Urologic: No hematuria, dysuria Abdominal:   No nausea, vomiting, diarrhea, bright red blood per rectum, melena, or hematemesis Neurologic:  No visual changes, wkns, changes in mental status. All other systems reviewed and are otherwise negative except as noted above.  Physical Exam  Blood pressure 111/63, pulse 90, temperature 98.2 F (36.8 C), temperature source Oral, resp. rate 17, height 5' (1.524 m), weight 121 lb (54.885 kg), last menstrual period 06/15/2015, SpO2 100 %, not currently breastfeeding.  General: Pleasant, pregnant female in NAD Psych: Normal affect. Neuro:  Alert and oriented X 3. Moves all extremities spontaneously. HEENT: Normal  Neck: Supple without bruits or JVD. Lungs:  Resp regular and unlabored, CTA. Heart: RRR no s3, s4, or murmurs. Abdomen: pregnant.  Extremities: No clubbing, cyanosis or edema. DP/PT/Radials 2+ and equal bilaterally.  Labs  Troponin (Point of Care Test) No results for input(s): TROPIPOC in the last 72 hours.  Recent Labs  12/06/15 1735 12/06/15 2331 12/07/15 0623  TROPONINI <0.03 <0.03 <0.03   Lab Results  Component Value Date   WBC 6.1 12/06/2015   HGB 9.8* 12/06/2015   HCT 30.3* 12/06/2015   MCV 84.4 12/06/2015   PLT 242 12/06/2015    Recent Labs Lab 12/06/15 1735  NA 135  K 3.8  CL 106  CO2 21*  BUN 10  CREATININE 0.46  CALCIUM 9.0  PROT 6.8  BILITOT 1.1  ALKPHOS 130*  ALT 11*  AST 23  GLUCOSE 75   Lab Results  Component Value Date   CHOL 183 05/17/2015   HDL 45 05/17/2015   LDLCALC 117* 05/17/2015   TRIG 106 05/17/2015   Lab Results  Component Value Date   DDIMER 0.30 05/16/2015     Radiology/Studies  [place] Mfm Fetal Bpp Wo Non Stress  12/06/2015  OBSTETRICAL ULTRASOUND: This exam was performed within a [place] Ultrasound Department. The OB US report was generated in the AS system, and faxed to the ordering physician.  This report is available in the [employer]. See the AS Obstetric US report via the Image Link.  [place] Mfm Ob Detail +14 Wk  11/08/2015  OBSTETRICAL ULTRASOUND: This exam was performed within a [place] Ultrasound Department. The OB US report was generated in the AS system, and faxed to the ordering physician.  This report is available in the [employer] P[employer]. See the AS Obstetric US report via the Image Link.  [place] Mfm Ob Follow Up  12/06/2015  OBSTETRICAL ULTRASOUND: This exam was performed within a [place] Ultrasound Department. The OB US report was generated in the AS system, and faxed to the ordering physician.  This report is available in the [employer] P[employer].  See the AS Obstetric US report via the Image Link.  [place] Mfm Ob Limited  11/23/2015  OBSTETRICAL ULTRASOUND: This exam was performed within a [place] Ultrasound Department. The OB US report was generated in the AS system, and faxed to the ordering physician.  This report is available in the [employer] P[employer]. See the AS Obstetric US report via the Image Link.  [place] Mfm Ob Limited  11/15/2015  OBSTETRICAL ULTRASOUND: This exam was performed within a [place] Ultrasound Department. The OB US report was generated in the AS system, and faxed to the ordering physician.  This report is available in the [employer] P[employer]. See the AS Obstetric US report via the Image Link.   ECG  Nonspecific TW abnormalities in the inferolateral leads/ improved some from prior EKGs. No ST elevations  Echocardiogram - pending  ASSESSMENT AND PLAN  Principal Problem:   Chest pain Active Problems:   Preterm contractions   1. Chest Pain/ H/o SCAD: cardiac enzymes are negative x 3. EKG is w/o ST elevations (patient had ST elevations associated with her SCAD in 2016, post delivery). EKGs this admission have shown nonspecific TW abnormalities in the inferolateral leads, somewhat improved from prior EKGs. Her BP and HR are both stable. We will order a 2D echo to assess for any LV structural abnormalities, including changes in LVF and wall motion.  No indications for LHC at this point. Further cardiac w/u will be determined once we have results of 2D echo. [facility]. OK to perform if contrast is needed. She continues to have intermittent chest discomfort. Will consider adding low dose Imdur. However will defer initiation to Dr. Anne FuSkains given BP in the low 100s-110s systolic. Will want to avoid hypotension in this pregnant female. Dr. Anne FuSkains to see shortly and will provide further recommendations.   2. Preterm Contractions: [redacted] weeks gestation. Evaluated by OB earlier today.   No cervical change since admission. No evidence of labor at this time  Signed, Robbie LisBrittainy Simmons, PA-C 12/07/2015, 10:31 AM  Personally seen and examined. Agree with above.  25 year old 6736 weeks pregnant with history of SCAD (spontaneous coronary artery dissection) of LAD (left anterior descending artery) here with chest pain.  Pain was  fairly constant. Not typical of angina. Now appears comfortable in bed. No increased work of breathing. No rash. Pulses are equal in both arms.   - exam RRR, no new rubs.  - lungs CTAB, protuberant abd  Plan  - await ECHO. Prior Echo revealed EF of 45-50% with hypokinesis of the basal inferiormyocardium.  - start Imdur  PO QD. BP should be able to tolerate.   - reassuring troponin. Normal.  - No ST elevation on ECG. Baseline abnormality noted. No change from prior.   - described future pregnancy risks of SCAD.   If ECHO unchanged, OK with DC home.   Donato Schultz, MD

## 2015-12-07 NOTE — Discharge Instructions (Signed)

## 2015-12-07 NOTE — Progress Notes (Signed)
I offered initial spiritual and emotional support to pt due to her health condition.  She reported that she is doing fine and coping okay with all of this.  She did not wish to talk further at this time but is aware of our ongoing availability for support.  Chaplain Dyanne CarrelKaty Quamel Fitzmaurice, Bcc Pager, [phone] 11:47 AM    12/07/15 1100  Clinical Encounter Type  Visited With Patient  Visit Type Initial  Stress Factors  Patient Stress Factors Health changes

## 2015-12-07 NOTE — Progress Notes (Signed)
Pt states that she is having contractions 8/10 which are occurring around every 4 minutes. L&D monitoring EFM strip at this time. Charge Nurse, Herbert SetaHeather contacted to review strip. Herbert SetaHeather states that no regular contractions are noted. Pt medicated for pain. Will continue to monitor. Carmelina DaneERRI L Ewin Rehberg, RN

## 2015-12-07 NOTE — Progress Notes (Signed)
  Echocardiogram 2D Echocardiogram has been performed.  Cathie BeamsGREGORY, Jonell Krontz 12/07/2015, 3:53 PM

## 2015-12-07 NOTE — Progress Notes (Signed)
Called to evaluate patient complaining of 10/10 pain with contractions. Patient reports feeling a lot of pelvic pressure.  SVE: 1/50/high FHT: baseline 150, mod variability, + accels, no decels Toto: irregular contractions q6-10 minutes  A/P 25 yo [id number] at 36 weeks admitted with chest pain and significant cardiac history - no cervical change since admission. No evidence of labor at this time - Reassurance provide - Continue current care - Awaiting cardiology consult

## 2015-12-07 NOTE — Progress Notes (Signed)
    Reassuring echocardiogram. No change.  Donato SchultzMark Skains, MD

## 2015-12-07 NOTE — Progress Notes (Signed)
Patient ID: Jenny HillockLatrice Myers, female   DOB: 08/31/1990, 25 y.o.   MRN: [id number]   Subjective: Interval History:continues to have significant CP unrelieved with NTG, Percocet. Could not sleep. Required Morphine for some relief. Continues to have UC's but no worse and no previous cervical change.  Objective: Vital signs in last 24 hours: Temp:  [98.2 F (36.8 C)-98.4 F (36.9 C)] 98.4 F (36.9 C) (06/20 2329) Pulse Rate:  [70-124] 91 (06/21 0601) Resp:  [11-25] 19 (06/21 0601) BP: (104-127)/(47-90) 123/84 mmHg (06/21 0601) SpO2:  [97 %-100 %] 100 % (06/21 0601) Weight:  [121 lb (54.885 kg)-121 lb 2 oz (54.942 kg)] 121 lb (54.885 kg) (06/20 2005)  Intake/Output from previous day: 06/20 0701 - 06/21 0700 In: 1208.3 [I.V.:1208.3] Out: -   General appearance: alert, cooperative, appears stated age and no distress Neck: supple, symmetrical, trachea midline Lungs: normal effort Heart: regular rate and rhythm Abdomen: gravid, NT Extremities: Homans sign is negative, no sign of DVT Skin: Skin color, texture, turgor normal. No rashes or lesions  Results for orders placed or performed during the hospital encounter of 12/06/15 (from the past 24 hour(s))  Troponin I     Status: None   Collection Time: 12/06/15  5:35 PM  Result Value Ref Range   Troponin I <0.03 <0.031 ng/mL  Brain natriuretic peptide     Status: Abnormal   Collection Time: 12/06/15  5:35 PM  Result Value Ref Range   B Natriuretic Peptide 135.2 (H) 0.0 - 100.0 pg/mL  Protime-INR     Status: None   Collection Time: 12/06/15  5:35 PM  Result Value Ref Range   Prothrombin Time 12.8 11.6 - 15.2 seconds   INR 0.94 0.00 - 1.49  CBC with Differential/Platelet     Status: Abnormal   Collection Time: 12/06/15  5:35 PM  Result Value Ref Range   WBC 6.1 4.0 - 10.5 K/uL   RBC 3.59 (L) 3.87 - 5.11 MIL/uL   Hemoglobin 9.8 (L) 12.0 - 15.0 g/dL   HCT 78.230.3 (L) 95.636.0 - 21.346.0 %   MCV 84.4 78.0 - 100.0 fL   MCH 27.3 26.0 - 34.0 pg   MCHC 32.3 30.0 - 36.0 g/dL   RDW 08.616.1 (H) 57.811.5 - 46.915.5 %   Platelets 242 150 - 400 K/uL   Neutrophils Relative % 61 %   Neutro Abs 3.8 1.7 - 7.7 K/uL   Lymphocytes Relative 31 %   Lymphs Abs 1.9 0.7 - 4.0 K/uL   Monocytes Relative 6 %   Monocytes Absolute 0.3 0.1 - 1.0 K/uL   Eosinophils Relative 2 %   Eosinophils Absolute 0.1 0.0 - 0.7 K/uL   Basophils Relative 0 %   Basophils Absolute 0.0 0.0 - 0.1 K/uL  Comprehensive metabolic panel     Status: Abnormal   Collection Time: 12/06/15  5:35 PM  Result Value Ref Range   Sodium 135 135 - 145 mmol/L   Potassium 3.8 3.5 - 5.1 mmol/L   Chloride 106 101 - 111 mmol/L   CO2 21 (L) 22 - 32 mmol/L   Glucose, Bld 75 65 - 99 mg/dL   BUN 10 6 - 20 mg/dL   Creatinine, Ser 6.290.46 0.44 - 1.00 mg/dL   Calcium 9.0 8.9 - 52.810.3 mg/dL   Total Protein 6.8 6.5 - 8.1 g/dL   Albumin 3.1 (L) 3.5 - 5.0 g/dL   AST 23 15 - 41 U/L   ALT 11 (L) 14 - 54 U/L   Alkaline Phosphatase  130 (H) 38 - 126 U/L   Total Bilirubin 1.1 0.3 - 1.2 mg/dL   GFR calc non Af Amer >60 >60 mL/min   GFR calc Af Amer >60 >60 mL/min   Anion gap 8 5 - 15  Type and screen [facility]     Status: None   Collection Time: 12/06/15  5:38 PM  Result Value Ref Range   ABO/RH(D) A POS    Antibody Screen NEG    Sample Expiration 12/09/2015   Troponin I     Status: None   Collection Time: 12/06/15 11:31 PM  Result Value Ref Range   Troponin I <0.03 <0.031 ng/mL    EKG unchanged this am  Scheduled Meds: . docusate sodium  100 mg Oral Daily  . NIFEdipine  10 mg Oral Q8H  . pantoprazole  40 mg Oral Daily  . prenatal multivitamin  1 tablet Oral Q1200   Continuous Infusions: . sodium chloride 100 mL/hr at 12/07/15 0313   PRN Meds:acetaminophen, calcium carbonate, morphine injection, nitroGLYCERIN, oxyCODONE-acetaminophen, zolpidem  Assessment/Plan: Principal Problem:   Chest pain Active Problems:   Preterm contractions  Unclear etiology of CP--no change in EKG,  Troponins are normal x 2--will add PPI. If still no relief, consider ECHO/cards consult. Monitor UC's  LOS: 1 day   PRATT,TANYA S, MD 12/07/2015 7:03 AM

## 2015-12-07 NOTE — Discharge Summary (Signed)
Antenatal Discharge Summary     Patient Name: Jenny HillockLatrice Blackwelder DOB: 03/15/1991 MRN: [id number]  Date of admission: 12/06/2015 Date of discharge: 12/07/2015  Admitting diagnosis: 35wks chest pain Intrauterine pregnancy: 3684w0d     Secondary diagnosis:  Principal Problem:   Chest pain Active Problems:   Post partum STEMI secondary to LAD dissection 11/17/14   Gastroesophageal reflux disease without esophagitis   Supervision of high risk pregnancy, antepartum   Preterm contractions   Discharge diagnosis: Chest pain, atypical                                                                                                Hospital course:  Jenny HillockLatrice Hovater is a 25 y.o. [id number] at [redacted]w[redacted]d with history of postpartum spontaneous coronary artery dissection in 2016  presenting with chest pain. She was monitored over night and had serial troponins and EKG which did not show progression for signs of ACS. Cardiology was consulted and performed a 2D echo which was negative. She received multiple interventions including morphine, nitroglycerin, procardia without resolution. She was started on a PPI which may have improved her symptoms as they were resolved at the time of discharge. At the recommendation of cardiology she was sent home on Imdur to help with CP symptoms.   Of note we discussed with the patient safe dleivery planning. I explained that at [facility] we do not have continuous telemetry nor proximity to cardiac intervention. For this reason we feel she and her baby would be best served at [facility]. She expressed a desire to delivery here and I again discussed that [facility] is a safer delivery option but we are here for emergency care. I also discussed with the patient that should will have a passive second stage and explained this process in detail. She voiced understanding that [facility] a safer option for her.   Physical exam  Filed Vitals:   12/07/15 1530 12/07/15 1600 12/07/15 1700  12/07/15 1800  BP:  115/65  113/68  Pulse:  90 107 105  Temp:      TempSrc:      Resp:  16 17 16   Height:      Weight:      SpO2: 100% 100% 100% 100%   General: alert, cooperative and no distress CV: RR Lungs: CTAB Abd; Gravid, soft  Labs: Lab Results  Component Value Date   WBC 6.1 12/06/2015   HGB 9.8* 12/06/2015   HCT 30.3* 12/06/2015   MCV 84.4 12/06/2015   PLT 242 12/06/2015   CMP Latest Ref Rng 12/06/2015  Glucose 65 - 99 mg/dL 75  BUN 6 - 20 mg/dL 10  Creatinine 5.620.44 - 1.301.00 mg/dL 8.650.46  Sodium 784135 - 696145 mmol/L 135  Potassium 3.5 - 5.1 mmol/L 3.8  Chloride 101 - 111 mmol/L 106  CO2 22 - 32 mmol/L 21(L)  Calcium 8.9 - 10.3 mg/dL 9.0  Total Protein 6.5 - 8.1 g/dL 6.8  Total Bilirubin 0.3 - 1.2 mg/dL 1.1  Alkaline Phos 38 - 126 U/L 130(H)  AST 15 - 41 U/L 23  ALT 14 -  54 U/L 11(L)   Lab Results  Component Value Date   TROPONINI <0.03 12/07/2015   TROPONINI <0.03 12/06/2015   TROPONINI <0.03 12/06/2015      Discharge instruction: per After Visit Summary and "Baby and Me Booklet".  After visit meds:    Medication List    TAKE these medications        aspirin 81 MG EC tablet  Take 1 tablet (81 mg total) by mouth daily.     isosorbide mononitrate 30 MG 24 hr tablet  Commonly known as:  IMDUR  Take 0.5 tablets (15 mg total) by mouth daily.     nitroGLYCERIN 0.4 MG SL tablet  Commonly known as:  NITROSTAT  Place 0.4 mg under the tongue every 5 (five) minutes as needed for chest pain (x 3 pills daily). Reported on 11/15/2015     prenatal multivitamin Tabs tablet  Take 1 tablet by mouth daily at 12 noon.        Diet: routine diet  Activity: Advance as tolerated  Outpatient follow up:as scheduled Follow up Appt: Future Appointments Date Time Provider Department Center  12/09/2015 10:45 AM [facility] NST [facility]  12/13/2015 2:15 PM [facility] NST [facility]  12/13/2015 3:00 PM [facility] [place] 3 [facility]-M[facility] M[facility]  12/15/2015 9:25 AM Eino Farber Kennith Gain, CNM  [facility]  12/16/2015 9:45 AM Chrystie Nose, MD [facility]  12/16/2015 10:45 AM [facility] NST [facility]  12/19/2015 10:45 AM [facility]-MFC NST [facility]  12/19/2015 12:00 PM [facility] [place] 3 [facility]-MFC[facility] M[facility]  12/23/2015 10:45 AM [facility]-MFC NST [facility]  12/27/2015 2:15 PM [facility]-MFC NST [facility]FC [facility]  12/27/2015 3:00 PM [facility]-MFC [place] 3 [facility]-MFC[facility] M[facility]  12/30/2015 10:45 AM [facility]-M[facility] NST [facility]  01/03/2016 2:15 PM [facility]-M[facility] NST [facility]  01/03/2016 3:00 PM [facility]-MFC [place] 3 WH-MFC[facility] MFC-US   Postpartum contraception: Contraceptive plan was discussed with our team, patient and cardiology. She is at high risk for complications and spontaneous coronary artery dissection and thus a reliable long term birth control is best.  At the time of discharge the patient was undecided.   12/07/2015 Federico Flake, MD

## 2015-12-09 ENCOUNTER — Ambulatory Visit (HOSPITAL_COMMUNITY)
Admission: RE | Admit: 2015-12-09 | Discharge: 2015-12-09 | Disposition: A | Discharge status: Home / Self Care | Payer: Medicaid Other | Source: Ambulatory Visit | Attending: Family Medicine | Admitting: Family Medicine

## 2015-12-09 ENCOUNTER — Inpatient Hospital Stay (HOSPITAL_COMMUNITY)
Admission: AD | Admit: 2015-12-09 | Discharge: 2015-12-09 | Disposition: A | Discharge status: Home / Self Care | Payer: Medicaid Other | Source: Ambulatory Visit | Attending: Family Medicine | Admitting: Family Medicine

## 2015-12-09 ENCOUNTER — Encounter (HOSPITAL_COMMUNITY): Payer: Self-pay | Admitting: *Deleted

## 2015-12-09 DIAGNOSIS — O368390 Maternal care for abnormalities of the fetal heart rate or rhythm, unspecified trimester, not applicable or unspecified: Secondary | ICD-10-CM

## 2015-12-09 DIAGNOSIS — Z87891 Personal history of nicotine dependence: Secondary | ICD-10-CM | POA: Insufficient documentation

## 2015-12-09 DIAGNOSIS — Z880 Allergy status to penicillin: Secondary | ICD-10-CM | POA: Insufficient documentation

## 2015-12-09 DIAGNOSIS — Z3A36 36 weeks gestation of pregnancy: Secondary | ICD-10-CM | POA: Insufficient documentation

## 2015-12-09 NOTE — Discharge Instructions (Signed)
Fetal Movement Counts  Patient Name: __________________________________________________ Patient Due Date: ____________________  Performing a fetal movement count is highly recommended in high-risk pregnancies, but it is good for every pregnant woman to do. Your health care provider may ask you to start counting fetal movements at 28 weeks of the pregnancy. Fetal movements often increase:  · After eating a full meal.  · After physical activity.  · After eating or drinking something sweet or cold.  · At rest.  Pay attention to when you feel the baby is most active. This will help you notice a pattern of your baby's sleep and wake cycles and what factors contribute to an increase in fetal movement. It is important to perform a fetal movement count at the same time each day when your baby is normally most active.   HOW TO COUNT FETAL MOVEMENTS  1. Find a quiet and comfortable area to sit or lie down on your left side. Lying on your left side provides the best blood and oxygen circulation to your baby.  2. Write down the day and time on a sheet of paper or in a journal.  3. Start counting kicks, flutters, swishes, rolls, or jabs in a 2-hour period. You should feel at least 10 movements within 2 hours.  4. If you do not feel 10 movements in 2 hours, wait 2-3 hours and count again. Look for a change in the pattern or not enough counts in 2 hours.  SEEK MEDICAL CARE IF:  · You feel less than 10 counts in 2 hours, tried twice.  · There is no movement in over an hour.  · The pattern is changing or taking longer each day to reach 10 counts in 2 hours.  · You feel the baby is not moving as he or she usually does.  Date: ____________ Movements: ____________ Start time: ____________ Finish time: ____________   Date: ____________ Movements: ____________ Start time: ____________ Finish time: ____________  Date: ____________ Movements: ____________ Start time: ____________ Finish time: ____________  Date: ____________ Movements:  ____________ Start time: ____________ Finish time: ____________  Date: ____________ Movements: ____________ Start time: ____________ Finish time: ____________  Date: ____________ Movements: ____________ Start time: ____________ Finish time: ____________  Date: ____________ Movements: ____________ Start time: ____________ Finish time: ____________  Date: ____________ Movements: ____________ Start time: ____________ Finish time: ____________   Date: ____________ Movements: ____________ Start time: ____________ Finish time: ____________  Date: ____________ Movements: ____________ Start time: ____________ Finish time: ____________  Date: ____________ Movements: ____________ Start time: ____________ Finish time: ____________  Date: ____________ Movements: ____________ Start time: ____________ Finish time: ____________  Date: ____________ Movements: ____________ Start time: ____________ Finish time: ____________  Date: ____________ Movements: ____________ Start time: ____________ Finish time: ____________  Date: ____________ Movements: ____________ Start time: ____________ Finish time: ____________   Date: ____________ Movements: ____________ Start time: ____________ Finish time: ____________  Date: ____________ Movements: ____________ Start time: ____________ Finish time: ____________  Date: ____________ Movements: ____________ Start time: ____________ Finish time: ____________  Date: ____________ Movements: ____________ Start time: ____________ Finish time: ____________  Date: ____________ Movements: ____________ Start time: ____________ Finish time: ____________  Date: ____________ Movements: ____________ Start time: ____________ Finish time: ____________  Date: ____________ Movements: ____________ Start time: ____________ Finish time: ____________   Date: ____________ Movements: ____________ Start time: ____________ Finish time: ____________  Date: ____________ Movements: ____________ Start time: ____________ Finish  time: ____________  Date: ____________ Movements: ____________ Start time: ____________ Finish time: ____________  Date: ____________ Movements: ____________ Start time:   ____________ Finish time: ____________  Date: ____________ Movements: ____________ Start time: ____________ Finish time: ____________  Date: ____________ Movements: ____________ Start time: ____________ Finish time: ____________  Date: ____________ Movements: ____________ Start time: ____________ Finish time: ____________   Date: ____________ Movements: ____________ Start time: ____________ Finish time: ____________  Date: ____________ Movements: ____________ Start time: ____________ Finish time: ____________  Date: ____________ Movements: ____________ Start time: ____________ Finish time: ____________  Date: ____________ Movements: ____________ Start time: ____________ Finish time: ____________  Date: ____________ Movements: ____________ Start time: ____________ Finish time: ____________  Date: ____________ Movements: ____________ Start time: ____________ Finish time: ____________  Date: ____________ Movements: ____________ Start time: ____________ Finish time: ____________   Date: ____________ Movements: ____________ Start time: ____________ Finish time: ____________  Date: ____________ Movements: ____________ Start time: ____________ Finish time: ____________  Date: ____________ Movements: ____________ Start time: ____________ Finish time: ____________  Date: ____________ Movements: ____________ Start time: ____________ Finish time: ____________  Date: ____________ Movements: ____________ Start time: ____________ Finish time: ____________  Date: ____________ Movements: ____________ Start time: ____________ Finish time: ____________  Date: ____________ Movements: ____________ Start time: ____________ Finish time: ____________   Date: ____________ Movements: ____________ Start time: ____________ Finish time: ____________  Date: ____________  Movements: ____________ Start time: ____________ Finish time: ____________  Date: ____________ Movements: ____________ Start time: ____________ Finish time: ____________  Date: ____________ Movements: ____________ Start time: ____________ Finish time: ____________  Date: ____________ Movements: ____________ Start time: ____________ Finish time: ____________  Date: ____________ Movements: ____________ Start time: ____________ Finish time: ____________  Date: ____________ Movements: ____________ Start time: ____________ Finish time: ____________   Date: ____________ Movements: ____________ Start time: ____________ Finish time: ____________  Date: ____________ Movements: ____________ Start time: ____________ Finish time: ____________  Date: ____________ Movements: ____________ Start time: ____________ Finish time: ____________  Date: ____________ Movements: ____________ Start time: ____________ Finish time: ____________  Date: ____________ Movements: ____________ Start time: ____________ Finish time: ____________  Date: ____________ Movements: ____________ Start time: ____________ Finish time: ____________     This information is not intended to replace advice given to you by your health care provider. Make sure you discuss any questions you have with your health care provider.     Document Released: 07/04/2006 Document Revised: 06/25/2014 Document Reviewed: 03/31/2012  Elsevier Interactive Patient Education ©2016 [employer]

## 2015-12-09 NOTE — MAU Provider Note (Signed)
History     CSN: [id number]  Arrival date and time: 12/09/15 1149   None     Chief Complaint  Patient presents with  . Non-stress Test   HPI Jenny Myers is a 25 y.o. [id number] at 7597w2d who presents sent from MFM for fetal monitoring. Patient was in MFM today for NST; had variable deceleration on monitor & was sent here for "prolonged monitoring". Patient denies abdominal pain, vaginal bleeding, or LOF. Positive fetal movement.   OB History    Gravida Para Term Preterm AB TAB SAB Ectopic Multiple Living   7 3 3  0 3 1 1 1  0 3      Past Medical History  Diagnosis Date  . Ectopic pregnancy 11/2010    Right salpingectomy  . Umbilical hernia   . Infection     chlamydia  . 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 Pericarditis 05/2015  . Myocardial infarction [facility])     Past Surgical History  Procedure Laterality Date  . Salpingectomy  2012    left ovary, ruptured ectopic  . Wisdom tooth extraction    . Induced abortion    . Cardiac catheterization N/A 11/17/2014    Procedure: Left Heart Cath and Coronary Angiography;  Surgeon: Lennette Biharihomas A Kelly, MD;  [facility];  Service: Cardiovascular;  Laterality: N/A;  . Cardiac catheterization N/A 11/17/2014    Procedure: Left Heart Cath and Coronary Angiography;  Surgeon: Lennette Biharihomas A Kelly, MD;  Location: [facility] INVASIVE CV LAB;  Service: Cardiovascular;  Laterality: N/A;    Family History  Problem Relation Age of Onset  . Hypertension Mother   . Diabetes Father   . Hypertension Father   . Cancer Father     breast  . Vision loss Paternal Grandmother   . Other Neg Hx     Social History  Substance Use Topics  . Smoking status: Former Games developermoker  . Smokeless tobacco: Never Used     Comment: quit with preg  . Alcohol Use: No     Comment: not since pregnancy    Allergies:  Allergies  Allergen Reactions  . Garlic Anaphylaxis  . Penicillins Anaphylaxis and Nausea And Vomiting    Has  patient had a PCN reaction causing immediate rash, facial/tongue/throat swelling, SOB or lightheadedness with hypotension: Yes Has patient had a PCN reaction causing severe rash involving mucus membranes or skin necrosis: No Has patient had a PCN reaction that required hospitalization No Has patient had a PCN reaction occurring within the last 10 years: No If all of the above answers are "NO", then may proceed with Cephalosporin use.   . Carrot Flavor Hives  . Fentanyl Hives    No prescriptions prior to admission    Review of Systems  Constitutional: Negative.   Gastrointestinal: Negative.   Genitourinary: Negative.    Physical Exam   Blood pressure 126/68, pulse 90, temperature 97.8 F (36.6 C), temperature source Oral, resp. rate 17, last menstrual period 06/15/2015, SpO2 100 %, not currently breastfeeding.  Physical Exam  Nursing note and vitals reviewed. Constitutional: She is oriented to person, place, and time. She appears well-developed and well-nourished. No distress.  HENT:  Head: Normocephalic and atraumatic.  Eyes: Conjunctivae are normal. Right eye exhibits no discharge. Left eye exhibits no discharge. No scleral icterus.  Neck: Normal range of motion.  Cardiovascular: Normal rate.   Respiratory: Effort normal. No respiratory distress.  Neurological: She is alert and oriented to  person, place, and time.  Skin: Skin is warm and dry. She is not diaphoretic.  Psychiatric: She has a normal mood and affect. Her behavior is normal. Judgment and thought content normal.   Fetal Tracing:  Baseline: 135 Variability: moderate Accelerations: 15x15 Decelerations: none  Toco: irregular   MAU Course  Procedures No results found for this or any previous visit (from the past 24 hour(s)).  MDM Reactive fetal tracing -- category 1 Pt requesting to go home to pick up her child S/w Dr. Shawnie PonsPratt regarding reason for MAU visit & fetal monitoring in MAU -- ok to discharge  home  Assessment and Plan  A: 1. Variable fetal heart rate decelerations, antepartum     P: Discharge home Discussed reasons to return to MAU Keep f/u with ob  [name] 12/09/2015, 1:35 PM

## 2015-12-09 NOTE — ED Notes (Signed)
Patient taken to MAU for prolonged monitoring for variable seen on NST, per Dr. Sherrie Georgeecker.

## 2015-12-09 NOTE — MAU Note (Signed)
Pt presents to MAU from clinic for prolonged monitoring. PT denies any vaginal bleeding or LOF. Reports good fetal movement

## 2015-12-13 ENCOUNTER — Other Ambulatory Visit (HOSPITAL_COMMUNITY): Payer: Medicaid Other

## 2015-12-13 ENCOUNTER — Ambulatory Visit (HOSPITAL_COMMUNITY): Admission: RE | Admit: 2015-12-13 | Payer: Medicaid Other | Source: Ambulatory Visit

## 2015-12-15 ENCOUNTER — Other Ambulatory Visit (HOSPITAL_COMMUNITY)
Admission: RE | Admit: 2015-12-15 | Discharge: 2015-12-15 | Disposition: A | Discharge status: Home / Self Care | Payer: Medicaid Other | Source: Ambulatory Visit | Attending: Family | Admitting: Family

## 2015-12-15 ENCOUNTER — Encounter: Payer: Self-pay | Admitting: Family

## 2015-12-15 ENCOUNTER — Ambulatory Visit (INDEPENDENT_AMBULATORY_CARE_PROVIDER_SITE_OTHER): Payer: Medicaid Other | Admitting: Family

## 2015-12-15 VITALS — BP 129/57 | HR 87 | Wt 123.7 lb

## 2015-12-15 DIAGNOSIS — O99320 Drug use complicating pregnancy, unspecified trimester: Secondary | ICD-10-CM | POA: Insufficient documentation

## 2015-12-15 DIAGNOSIS — O99323 Drug use complicating pregnancy, third trimester: Secondary | ICD-10-CM

## 2015-12-15 DIAGNOSIS — D649 Anemia, unspecified: Secondary | ICD-10-CM

## 2015-12-15 DIAGNOSIS — O99013 Anemia complicating pregnancy, third trimester: Secondary | ICD-10-CM

## 2015-12-15 DIAGNOSIS — O99413 Diseases of the circulatory system complicating pregnancy, third trimester: Secondary | ICD-10-CM | POA: Diagnosis present

## 2015-12-15 DIAGNOSIS — Z113 Encounter for screening for infections with a predominantly sexual mode of transmission: Secondary | ICD-10-CM | POA: Diagnosis not present

## 2015-12-15 DIAGNOSIS — O09893 Supervision of other high risk pregnancies, third trimester: Secondary | ICD-10-CM

## 2015-12-15 DIAGNOSIS — I519 Heart disease, unspecified: Secondary | ICD-10-CM

## 2015-12-15 DIAGNOSIS — O99419 Diseases of the circulatory system complicating pregnancy, unspecified trimester: Secondary | ICD-10-CM

## 2015-12-15 DIAGNOSIS — O0993 Supervision of high risk pregnancy, unspecified, third trimester: Secondary | ICD-10-CM

## 2015-12-15 LAB — POCT URINALYSIS DIP (DEVICE)
GLUCOSE, UA: NEGATIVE mg/dL
Hgb urine dipstick: NEGATIVE
KETONES UR: NEGATIVE mg/dL
Leukocytes, UA: NEGATIVE
Nitrite: NEGATIVE
PROTEIN: 100 mg/dL — AB
Specific Gravity, Urine: 1.02 (ref 1.005–1.030)
UROBILINOGEN UA: 2 mg/dL — AB (ref 0.0–1.0)
pH: 7 (ref 5.0–8.0)

## 2015-12-15 MED ORDER — FUSION PLUS PO CAPS: 1.0000 | ORAL_CAPSULE | Freq: Every day | ORAL | Status: AC

## 2015-12-15 NOTE — Progress Notes (Signed)
Declined Tdap  

## 2015-12-15 NOTE — Progress Notes (Signed)
Subjective:    Jenny Myers is a [id number] 9147w1d being seen today for her first obstetrical visit.  Her obstetrical history is significant for late prenatal care, hx of cardiac issues (MI), and maternal drug use. Patient does intend to breast feed. Pregnancy history fully reviewed.  Patient reports reports increased pressure.  Filed Vitals:   12/15/15 0938  BP: 129/57  Pulse: 87  Weight: 123 lb 11.2 oz (56.11 kg)    HISTORY: OB History  Gravida Para Term Preterm AB SAB TAB Ectopic Multiple Living  7 3 3  0 3 1 1 1  0 3    # Outcome Date GA Lbr Len/2nd Weight Sex Delivery Anes PTL Lv  7 Current           6 Term 11/05/14 6543w3d 17:05 / 00:12 5 lb 8.7 oz (2.515 kg) F Vag-Spont EPI  Y  5 TAB 01/14/14          4 Term 02/27/12 5841w1d 09:31 / 00:05 5 lb 10 oz (2.55 kg) F Vag-Spont EPI  Y  3 Ectopic 2012             Comments: Salpingctomy  2 Term 2011 635w0d 04:00 3 lb 14 oz (1.758 kg) F Vag-Spont EPI  Y     Comments: Right ruptured ectopic, salpingctomy  1 SAB 2009             Past Medical History  Diagnosis Date  . Ectopic pregnancy 11/2010    Right salpingectomy  . Umbilical hernia   . Infection     chlamydia  . 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 Pericarditis 05/2015  . Myocardial infarction [facility](HCC)    Past Surgical History  Procedure Laterality Date  . Salpingectomy  2012    left ovary, ruptured ectopic  . Wisdom tooth extraction    . Induced abortion    . Cardiac catheterization Myers/A 11/17/2014    Procedure: Left Heart Cath and Coronary Angiography;  Surgeon: Lennette Biharihomas A Kelly, MD;  [facility];  Service: Cardiovascular;  Laterality: Myers/A;  . Cardiac catheterization Myers/A 11/17/2014    Procedure: Left Heart Cath and Coronary Angiography;  Surgeon: Lennette Biharihomas A Kelly, MD;  [facility]: [facility];  Service: Cardiovascular;  Laterality: Myers/A;   Family History  Problem Relation Age of Onset  . Hypertension Mother   .  Diabetes Father   . Hypertension Father   . Cancer Father     breast  . Vision loss Paternal Grandmother   . Other Neg Hx      Exam  Exam completed with daughter in room "good helper" Filed Vitals:   12/15/15 0938  BP: 129/57  Pulse: 87   System: Breast:  No nipple retraction or dimpling, No nipple discharge or bleeding, No axillary or supraclavicular adenopathy, Normal to palpation without dominant masses   Skin: normal coloration and turgor, no rashes    Neurologic: negative   Extremities: normal strength, tone, and muscle mass   HEENT neck supple with midline trachea and thyroid without masses   Mouth/Teeth mucous membranes moist, pharynx normal without lesions   Neck supple and no masses   Cardiovascular: regular rate and rhythm, no murmurs or gallops   Respiratory:  appears well, vitals normal, no respiratory distress, acyanotic, normal RR, neck free of mass or lymphadenopathy, chest clear, no wheezing, crepitations, rhonchi, normal symmetric air entry   Abdomen: soft, non-tender; bowel sounds normal; no masses,  no  organomegaly     Assessment:    Pregnancy: [id number] Patient Active Problem List   Diagnosis Date Noted  . Cardiac condition complicating pregnancy, antepartum 12/15/2015  . Maternal drug use complicating pregnancy, antepartum 12/15/2015  . Preterm contractions 12/07/2015  . Supervision of high risk pregnancy, antepartum 11/15/2015  . Gastroesophageal reflux disease without esophagitis   . Chest pain 06/26/2015  . Pericarditis   . Acute pericarditis 05/19/2015  . Acute coronary syndrome (HCC) 11/17/2014  . STEMI (ST elevation myocardial infarction) (HCC) 11/17/2014  . 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 Marijuana abuse 07/21/2014  . Family history of breast cancer in female 07/15/2014  . Umbilical hernia 07/15/2014    Plan:     Reviewed OB lab results. Discussed what is needed in labor (telemetry, passive 2nd stage) Prenatal  vitamins. Problem list reviewed and updated. Genetic Screening:  Late to care  Ultrasound discussed; fetal survey: results reviewed.  Last growth 35 wks 45%ile, next ultrasound tomorrow.  Follow up in 1 weeks.  Jenny Myers, Jenny Myers 12/15/2015

## 2015-12-15 NOTE — Progress Notes (Signed)
BTL papers signed today. 

## 2015-12-15 NOTE — Patient Instructions (Signed)

## 2015-12-16 ENCOUNTER — Ambulatory Visit: Payer: Medicaid Other | Admitting: Internal Medicine

## 2015-12-16 ENCOUNTER — Ambulatory Visit (HOSPITAL_COMMUNITY): Admission: RE | Admit: 2015-12-16 | Payer: Medicaid Other | Source: Ambulatory Visit

## 2015-12-16 LAB — URINE CULTURE: Colony Count: 25000

## 2015-12-16 LAB — CP5000051 PDM PROFILE

## 2015-12-16 LAB — PRENATAL PROFILE (SOLSTAS)
ANTIBODY SCREEN: NEGATIVE
Basophils Absolute: 0 cells/uL (ref 0–200)
Basophils Relative: 0 %
EOS ABS: 0 {cells}/uL — AB (ref 15–500)
Eosinophils Relative: 0 %
HCT: 28.4 % — ABNORMAL LOW (ref 35.0–45.0)
HIV: NONREACTIVE
Hemoglobin: 8.9 g/dL — ABNORMAL LOW (ref 11.7–15.5)
Hepatitis B Surface Ag: NEGATIVE
LYMPHS PCT: 27 %
Lymphs Abs: 1701 cells/uL (ref 850–3900)
MCH: 25.9 pg — ABNORMAL LOW (ref 27.0–33.0)
MCHC: 31.3 g/dL — ABNORMAL LOW (ref 32.0–36.0)
MCV: 82.6 fL (ref 80.0–100.0)
MONO ABS: 378 {cells}/uL (ref 200–950)
MONOS PCT: 6 %
MPV: 9.9 fL (ref 7.5–12.5)
NEUTROS PCT: 67 %
Neutro Abs: 4221 cells/uL (ref 1500–7800)
PLATELETS: 263 10*3/uL (ref 140–400)
RBC: 3.44 MIL/uL — ABNORMAL LOW (ref 3.80–5.10)
RDW: 16.3 % — AB (ref 11.0–15.0)
RH TYPE: POSITIVE
Rubella: 2.02 Index — ABNORMAL HIGH (ref <0.90)
WBC: 6.3 10*3/uL (ref 3.8–10.8)

## 2015-12-16 LAB — GC/CHLAMYDIA PROBE AMP (~~LOC~~) NOT AT ARMC
Chlamydia: NEGATIVE
NEISSERIA GONORRHEA: NEGATIVE

## 2015-12-16 LAB — GLUCOSE TOLERANCE, 1 HOUR (50G) W/O FASTING: GLUCOSE, 1 HR, GESTATIONAL: 93 mg/dL (ref <140)

## 2015-12-17 ENCOUNTER — Encounter: Payer: Self-pay | Admitting: Family

## 2015-12-17 ENCOUNTER — Other Ambulatory Visit: Payer: Self-pay | Admitting: Family

## 2015-12-17 DIAGNOSIS — O99019 Anemia complicating pregnancy, unspecified trimester: Secondary | ICD-10-CM | POA: Insufficient documentation

## 2015-12-17 DIAGNOSIS — O99013 Anemia complicating pregnancy, third trimester: Secondary | ICD-10-CM

## 2015-12-19 ENCOUNTER — Ambulatory Visit (HOSPITAL_COMMUNITY): Admission: RE | Admit: 2015-12-19 | Payer: Medicaid Other | Source: Ambulatory Visit

## 2015-12-19 ENCOUNTER — Ambulatory Visit (HOSPITAL_COMMUNITY): Payer: Medicaid Other | Attending: Family Medicine

## 2015-12-22 ENCOUNTER — Encounter (HOSPITAL_COMMUNITY): Payer: Self-pay

## 2015-12-22 ENCOUNTER — Encounter: Payer: Medicaid Other | Admitting: Obstetrics and Gynecology

## 2015-12-22 ENCOUNTER — Inpatient Hospital Stay (HOSPITAL_COMMUNITY)
Admission: AD | Admit: 2015-12-22 | Discharge: 2015-12-22 | DRG: 781 | Disposition: A | Discharge status: Other Acute Inpatient Hospital | Payer: Medicaid Other | Source: Ambulatory Visit | Attending: Obstetrics and Gynecology | Admitting: Obstetrics and Gynecology

## 2015-12-22 ENCOUNTER — Encounter (HOSPITAL_COMMUNITY): Payer: Self-pay | Admitting: Anesthesiology

## 2015-12-22 DIAGNOSIS — K219 Gastro-esophageal reflux disease without esophagitis: Secondary | ICD-10-CM | POA: Diagnosis present

## 2015-12-22 DIAGNOSIS — O99013 Anemia complicating pregnancy, third trimester: Secondary | ICD-10-CM | POA: Diagnosis present

## 2015-12-22 DIAGNOSIS — I2510 Atherosclerotic heart disease of native coronary artery without angina pectoris: Secondary | ICD-10-CM | POA: Diagnosis present

## 2015-12-22 DIAGNOSIS — Z3A38 38 weeks gestation of pregnancy: Secondary | ICD-10-CM

## 2015-12-22 DIAGNOSIS — Z8679 Personal history of other diseases of the circulatory system: Secondary | ICD-10-CM

## 2015-12-22 DIAGNOSIS — I249 Acute ischemic heart disease, unspecified: Secondary | ICD-10-CM | POA: Diagnosis present

## 2015-12-22 DIAGNOSIS — IMO0001 Reserved for inherently not codable concepts without codable children: Secondary | ICD-10-CM

## 2015-12-22 DIAGNOSIS — F1290 Cannabis use, unspecified, uncomplicated: Secondary | ICD-10-CM | POA: Diagnosis present

## 2015-12-22 DIAGNOSIS — Z79899 Other long term (current) drug therapy: Secondary | ICD-10-CM

## 2015-12-22 DIAGNOSIS — O99413 Diseases of the circulatory system complicating pregnancy, third trimester: Secondary | ICD-10-CM | POA: Diagnosis present

## 2015-12-22 DIAGNOSIS — Z7982 Long term (current) use of aspirin: Secondary | ICD-10-CM | POA: Diagnosis not present

## 2015-12-22 DIAGNOSIS — O99323 Drug use complicating pregnancy, third trimester: Secondary | ICD-10-CM | POA: Diagnosis present

## 2015-12-22 DIAGNOSIS — Z7902 Long term (current) use of antithrombotics/antiplatelets: Secondary | ICD-10-CM

## 2015-12-22 DIAGNOSIS — I519 Heart disease, unspecified: Secondary | ICD-10-CM

## 2015-12-22 DIAGNOSIS — I252 Old myocardial infarction: Secondary | ICD-10-CM

## 2015-12-22 LAB — TYPE AND SCREEN
ABO/RH(D): A POS
Antibody Screen: NEGATIVE

## 2015-12-22 LAB — BASIC METABOLIC PANEL
ANION GAP: 10 (ref 5–15)
BUN: 9 mg/dL (ref 6–20)
CHLORIDE: 105 mmol/L (ref 101–111)
CO2: 21 mmol/L — ABNORMAL LOW (ref 22–32)
Calcium: 8.4 mg/dL — ABNORMAL LOW (ref 8.9–10.3)
Creatinine, Ser: 0.56 mg/dL (ref 0.44–1.00)
GFR calc non Af Amer: >60 mL/min (ref >60)
Glucose, Bld: 46 mg/dL — ABNORMAL LOW (ref 65–99)
POTASSIUM: 3.9 mmol/L (ref 3.5–5.1)
SODIUM: 136 mmol/L (ref 135–145)

## 2015-12-22 LAB — GROUP B STREP BY PCR: GROUP B STREP BY PCR: NEGATIVE

## 2015-12-22 LAB — CBC
HCT: 29.2 % — ABNORMAL LOW (ref 36.0–46.0)
Hemoglobin: 9.2 g/dL — ABNORMAL LOW (ref 12.0–15.0)
MCH: 26.3 pg (ref 26.0–34.0)
MCHC: 31.5 g/dL (ref 30.0–36.0)
MCV: 83.4 fL (ref 78.0–100.0)
PLATELETS: 292 10*3/uL (ref 150–400)
RBC: 3.5 MIL/uL — ABNORMAL LOW (ref 3.87–5.11)
RDW: 16.4 % — ABNORMAL HIGH (ref 11.5–15.5)
WBC: 6.8 10*3/uL (ref 4.0–10.5)

## 2015-12-22 MED ORDER — LACTATED RINGERS IV SOLN: 500.0000 mL | INTRAVENOUS | Status: DC | PRN

## 2015-12-22 MED ORDER — ACETAMINOPHEN 325 MG PO TABS: 650.0000 mg | ORAL_TABLET | ORAL | Status: DC | PRN

## 2015-12-22 MED ORDER — FLEET ENEMA 7-19 GM/118ML RE ENEM: 1.0000 | ENEMA | RECTAL | Status: DC | PRN

## 2015-12-22 MED ORDER — OXYCODONE-ACETAMINOPHEN 5-325 MG PO TABS: 1.0000 | ORAL_TABLET | ORAL | Status: DC | PRN

## 2015-12-22 MED ORDER — FENTANYL CITRATE (PF) 100 MCG/2ML IJ SOLN: 100.0000 ug | INTRAMUSCULAR | Status: DC | PRN

## 2015-12-22 MED ORDER — OXYTOCIN 40 UNITS IN LACTATED RINGERS INFUSION - SIMPLE MED: 2.5000 [IU]/h | INTRAVENOUS | Status: DC

## 2015-12-22 MED ORDER — LACTATED RINGERS IV SOLN: INTRAVENOUS | Status: DC

## 2015-12-22 MED ORDER — OXYTOCIN BOLUS FROM INFUSION: 500.0000 mL | INTRAVENOUS | Status: DC

## 2015-12-22 MED ORDER — LACTATED RINGERS IV BOLUS (SEPSIS): 1000.0000 mL | Freq: Once | INTRAVENOUS | Status: DC

## 2015-12-22 MED ORDER — ONDANSETRON HCL 4 MG/2ML IJ SOLN: 4.0000 mg | Freq: Four times a day (QID) | INTRAMUSCULAR | Status: DC | PRN

## 2015-12-22 MED ORDER — LACTATED RINGERS IV SOLN
INTRAVENOUS | Status: DC
  Administered 2015-12-22 (×3): via INTRAVENOUS

## 2015-12-22 MED ORDER — SOD CITRATE-CITRIC ACID 500-334 MG/5ML PO SOLN
30.0000 mL | ORAL | Status: DC | PRN
  Filled 2015-12-22: qty 30

## 2015-12-22 MED ORDER — HYDROMORPHONE HCL 1 MG/ML IJ SOLN
1.0000 mg | INTRAMUSCULAR | Status: DC | PRN
  Administered 2015-12-22 (×2): 1 mg via INTRAVENOUS
  Filled 2015-12-22 (×2): qty 1

## 2015-12-22 MED ORDER — OXYCODONE-ACETAMINOPHEN 5-325 MG PO TABS
1.0000 | ORAL_TABLET | Freq: Once | ORAL | Status: AC
  Administered 2015-12-22: 1 via ORAL
  Filled 2015-12-22: qty 1

## 2015-12-22 MED ORDER — LIDOCAINE HCL (PF) 1 % IJ SOLN
30.0000 mL | INTRAMUSCULAR | Status: DC | PRN
  Filled 2015-12-22: qty 30

## 2015-12-22 MED ORDER — HYDROMORPHONE HCL 1 MG/ML IJ SOLN
1.0000 mg | Freq: Once | INTRAMUSCULAR | Status: AC
  Administered 2015-12-22: 1 mg via INTRAVENOUS
  Filled 2015-12-22: qty 1

## 2015-12-22 MED ORDER — OXYCODONE-ACETAMINOPHEN 5-325 MG PO TABS: 2.0000 | ORAL_TABLET | ORAL | Status: DC | PRN

## 2015-12-22 NOTE — Discharge Summary (Signed)
Obstetric Discharge Summary Reason for Admission: latent labor Intrapartum Procedures: observation on telemetry bed  HEMOGLOBIN  Date Value Ref Range Status  12/22/2015 9.2* 12.0 - 15.0 g/dL Final   HCT  Date Value Ref Range Status  12/22/2015 29.2* 36.0 - 46.0 % Final   Patient admitted to birthing suite secondary to latent labor with regular contractions every 2 minutes. She was observed for the development of spontaneous active labor but her contraction interval increased to every 8-10 minutes. Her cervical exam remained unchanged for over 4 hours. Patient has a pregnancy complicated by a history of myocardial infarction (postpartum STEMI) with her last pregnancy in 2016 likely secondary to coronary artery spontaneous dissection. She presented late to care at 31 weeks. Cardiology has been involved this pregnancy as well and recommended a passive second stage. She was seen by MFM on 5/26 who recommended transferring her care and delivery [facility]. [place] in [place] and was reluctant to [facility] as she has 3 other children with her. She is now requesting transfer of care [facility]. Case was discussed with Dr. Merrilee JanskyAndrade who is the accepting physician  FHT: baseline 120, mod variability, +accels, no decels Toco: ctx q8-10 minutes  Physical Exam:  General: alert, cooperative and no distress  Heart: regular rate and rythm Lungs: CTA b/l Abdomen: soft/gravid SVE: 5/80/-2 by RN DVT Evaluation: No evidence of DVT seen on physical exam.  Discharge Diagnoses: term pregnancy- undelivered  Discharge Information: Date: 12/22/2015 [facility]accepting physician Dr. Merrilee JanskyAndrade    Emmerson Shuffield 12/22/2015, 9:40 PM

## 2015-12-22 NOTE — H&P (Signed)
LABOR AND DELIVERY ADMISSION HISTORY AND PHYSICAL NOTE  Jenny Myers is a 25 y.o. female [id number] with IUP at 963w1d by 3rd trimester US presenting for labor check with chest pain. 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 Cardiologist reviewed her EKG as well. Cards following, has been consulted during this admission. For this pregnancy, she is to have a passive second stage + OVD.   She reports positive fetal movement. She denies leakage of fluid or vaginal bleeding.  Prenatal History/Complications:  Past Medical History: Past Medical History  Diagnosis Date  . Ectopic pregnancy 11/2010    Right salpingectomy  . Umbilical hernia   . Infection     chlamydia  . 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 Pericarditis 05/2015  . Myocardial infarction [facility](HCC)     Past Surgical History: Past Surgical History  Procedure Laterality Date  . Salpingectomy  2012    left ovary, ruptured ectopic  . Wisdom tooth extraction    . Induced abortion    . Cardiac catheterization N/A 11/17/2014    Procedure: Left Heart Cath and Coronary Angiography;  Surgeon: Lennette Biharihomas A Kelly, MD;  [facility];  Service: Cardiovascular;  Laterality: N/A;  . Cardiac catheterization N/A 11/17/2014    Procedure: Left Heart Cath and Coronary Angiography;  Surgeon: Lennette Biharihomas A Kelly, MD;  Location: [facility];  Service: Cardiovascular;  Laterality: N/A;    Obstetrical History: OB History    Gravida Para Term Preterm AB TAB SAB Ectopic Multiple Living   7 3 3  0 3 1 1 1  0 3      Social History: Social History   Social History  . Marital Status: Single    Spouse Name: N/A  . Number of Children: N/A  . Years of Education: N/A   Social History Main Topics  . Smoking status: Former Games developermoker  . Smokeless tobacco: Never Used     Comment: quit with preg  . Alcohol Use: No     Comment: not since pregnancy  . Drug  Use: No  . Sexual Activity: Yes    Birth Control/ Protection: None   Other Topics Concern  . None   Social History Narrative    Family History: Family History  Problem Relation Age of Onset  . Hypertension Mother   . Diabetes Father   . Hypertension Father   . Cancer Father     breast  . Vision loss Paternal Grandmother   . Other Neg Hx     Allergies: Allergies  Allergen Reactions  . Garlic Anaphylaxis  . Penicillins Anaphylaxis and Nausea And Vomiting    Has patient had a PCN reaction causing immediate rash, facial/tongue/throat swelling, SOB or lightheadedness with hypotension: Yes Has patient had a PCN reaction causing severe rash involving mucus membranes or skin necrosis: No Has patient had a PCN reaction that required hospitalization No Has patient had a PCN reaction occurring within the last 10 years: No If all of the above answers are "NO", then may proceed with Cephalosporin use.   . Carrot Flavor Hives  . Fentanyl Hives    Prescriptions prior to admission  Medication Sig Dispense Refill Last Dose  . aspirin EC 81 MG EC tablet Take 1 tablet (81 mg total) by mouth daily.   12/21/2015 at Unknown time  . clopidogrel (PLAVIX) 75 MG tablet Take 75 mg by mouth daily.   12/21/2015  at Unknown time  . Iron-FA-B Cmp-C-Biot-Probiotic (FUSION PLUS) CAPS Take 1 capsule by mouth daily. 30 capsule 6 12/21/2015 at Unknown time  . nitroGLYCERIN (NITROSTAT) 0.4 MG SL tablet Place 0.4 mg under the tongue every 5 (five) minutes as needed for chest pain (x 3 pills daily). Reported on 11/15/2015   2 weeks ago  . Prenatal Vit-Fe Fumarate-FA (PRENATAL MULTIVITAMIN) TABS tablet Take 1 tablet by mouth daily at 12 noon.    12/21/2015 at Unknown time     Review of Systems   All systems reviewed and negative except as stated in HPI  Blood pressure 115/68, pulse 86, resp. rate 18, height 5' (1.524 m), weight 55.792 kg (123 lb), last menstrual period 06/15/2015, SpO2 99 %, not currently  breastfeeding. General appearance: alert, cooperative and no distress Lungs: clear to auscultation bilaterally Heart: regular rate and rhythm Abdomen: soft, non-tender; bowel sounds normal Extremities: No calf swelling or tenderness Presentation: cephalic Fetal monitoring: cat 1 Uterine activity: irregular contractions  Dilation: 4 Effacement (%): 80 Station: -2 Exam by:: J.Lowe RN   Prenatal labs: ABO, Rh: A/POS/-- (06/29 1049) Antibody: NEG (06/29 1049) Rubella: !Error! RPR: NON REAC (06/29 1049)  HBsAg: NEGATIVE (06/29 1049)  HIV: NONREACTIVE (06/29 1049)  GBS:    1 hr Glucola: too late Genetic screening:  Too late Anatomy US: normal after f/u for views  Prenatal Transfer Tool  Maternal Diabetes: No Genetic Screening: Declined Maternal Ultrasounds/Referrals: Normal Fetal Ultrasounds or other Referrals:  None Maternal Substance Abuse:  Yes:  Type: Marijuana Significant Maternal Medications:  Meds include: Other: aspirin, plavix, imdur Significant Maternal Lab Results: None  No results found for this or any previous visit (from the past 24 hour(s)).  Patient Active Problem List   Diagnosis Date Noted  . Maternal anemia in pregnancy, antepartum 12/17/2015  . Cardiac condition complicating pregnancy, antepartum 12/15/2015  . Maternal drug use complicating pregnancy, antepartum 12/15/2015  . Preterm contractions 12/07/2015  . Supervision of high risk pregnancy, antepartum 11/15/2015  . Gastroesophageal reflux disease without esophagitis   . Chest pain 06/26/2015  . Pericarditis   . Acute pericarditis 05/19/2015  . Acute coronary syndrome (HCC) 11/17/2014  . STEMI (ST elevation myocardial infarction) (HCC) 11/17/2014  . 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 Marijuana abuse 07/21/2014  . Family history of breast cancer in female 07/15/2014  . Umbilical hernia 07/15/2014    Assessment: [name] is a 25 y.o. [id number] at [redacted]w[redacted]d here for SOL.     #Labor: 5cm, nonaugmented. Needs tele during labor per cards. #Pain: Consulting cardiology and anesthesia for options 2/2 recent plavix use. Cards said nitrous and IV dilaudid ok. #FWB: Cat 1 #ID:  GBS unk #MOF: bottle #MOC:BTL (papers need to be found from clinic) #Circ:  undecided  Loni Muse 12/22/2015, 11:42 AM  The patient was seen and examined by me also Agree with note NST reactive and reassuring UCs as listed Cervical exams as listed in note  Previous HPI: 25 yo woman with prior spontaneous dissection of LAD after vaginal delivery in 11/2014 treated with medical therapy, recurrent CP evaluated with CMR and coronary CTA in 04/2015, LVEF 48% by CMR on 05/17/2015, presents today with c/o left lower jaw pain and CP.   Consultation with Dr Anne Fu from [facility] His recommendations are summarized in my next note This patient had a SCAD last year. Recurrence risk is unknown Telemetry will be required as well as analgesia and passive second stage She did stop  her Plavix in May but restarted it in June. The last dose was yesterday, therefore regional anesthesia is contraindicated We will utilize Dilaudid for now, as she is allergic to Fentanyl Will consult as to use of Nitrous Oxide May need more sedation at second stage Will watch for ST elevation and or ectopy and chest pain as indicators for transfer to Cath Lab Dr Jolayne Pantheronstant updated Anesthesia consult  Aviva SignsMarie L Tenishia Ekman, CNM

## 2015-12-22 NOTE — Progress Notes (Signed)
[facility]  Report given to [facility] charge Farrel GordonJulie H., rn

## 2015-12-22 NOTE — Progress Notes (Signed)
Spoke with eLink

## 2015-12-22 NOTE — Progress Notes (Signed)
intermittent EFM tracing, MHR at times.Pt also up to bathroom

## 2015-12-22 NOTE — Progress Notes (Signed)
Had a conservation with pt [facility] cardiac condi. Pt emotional and crying. Pt will discuss with husband about it.

## 2015-12-22 NOTE — MAU Note (Signed)
Pt reports ctx since last night. Denies SROM or bleeding and reports good fetal moveent

## 2015-12-22 NOTE — Progress Notes (Signed)
Called by Dr. Jolayne Pantheronstant with patient in active labor. She has a history of SCAD with her prior pregnancy. Just seen by Dr. Anne FuSkains and echo was reassuring. No chest pain currently. Inquired about possibly using nitrous oxide for pain relief since the patient is on Plavix and epidural would be risky for spinal hematoma. I am not aware of data with NO in this population, but it would generally be considered safe from a cardiac standpoint.  Cardiology will be available to round on her as necessary - please call for assistance.  Chrystie NoseKenneth C. Justus Droke, MD, [facility] Attending Cardiologist [facility]

## 2015-12-22 NOTE — Progress Notes (Signed)
Patient ID: Darrelyn HillockLatrice Myers, female   DOB: 11/03/1990, 25 y.o.   MRN: [id number] Late entry:  Doing well, hurting with contractions and states Dilaudid is not helping much  Filed Vitals:   12/22/15 1845 12/22/15 1850 12/22/15 1855 12/22/15 1900  BP:    127/80  Pulse: 68 69 97 94  Temp:      TempSrc:      Resp: 17 19 16 17   Height:      Weight:      SpO2: 100% 100% 100% 100%   Discussed her case earlier with Dr Anne FuSkains from [facility] who was very familiar with her  She recently had an echocardiogram which was normal Her EKG today was read by him and he states there are no changes   He does recommend Telemetry during labor and for 24 hours postpartum O2 2lpm by [place] as a precautionary measure Analgesia preferred, cannot do epidural because last Plavix was yesterday.  May use any analgesic, including Nitrous Oxide Primary signs of worsening would be ST elevation, constant chest pain or arrhythmias  If she has cardiac complications, call Dr Clarnce FlockFudim (number above) and send to Cath Lab immediately via [facility]  Cervix 5cm/-2/vertex  FHR reassuring UCs now spaced out  Per Dr Jolayne Pantheronstant, may augment with AROM or Pitocin

## 2015-12-23 ENCOUNTER — Ambulatory Visit (HOSPITAL_COMMUNITY): Admission: RE | Admit: 2015-12-23 | Payer: Medicaid Other | Source: Ambulatory Visit

## 2015-12-23 ENCOUNTER — Encounter: Payer: Self-pay | Admitting: *Deleted

## 2015-12-23 LAB — HIV ANTIBODY (ROUTINE TESTING W REFLEX): HIV SCREEN 4TH GENERATION: NONREACTIVE

## 2015-12-23 LAB — RPR: RPR: NONREACTIVE

## 2015-12-23 MED FILL — Hydromorphone HCl Inj 2 MG/ML: INTRAMUSCULAR | Qty: 1 | Status: AC

## 2015-12-27 ENCOUNTER — Ambulatory Visit (HOSPITAL_COMMUNITY): Payer: Medicaid Other

## 2015-12-28 ENCOUNTER — Encounter: Payer: Self-pay | Admitting: *Deleted

## 2015-12-29 ENCOUNTER — Encounter: Payer: Self-pay | Admitting: *Deleted

## 2015-12-30 ENCOUNTER — Ambulatory Visit (HOSPITAL_COMMUNITY): Payer: Medicaid Other

## 2016-01-03 ENCOUNTER — Ambulatory Visit (HOSPITAL_COMMUNITY): Payer: Medicaid Other

## 2016-01-20 ENCOUNTER — Ambulatory Visit: Payer: Medicaid Other | Admitting: Obstetrics & Gynecology

## 2016-01-25 ENCOUNTER — Ambulatory Visit: Payer: Medicaid Other | Admitting: Internal Medicine

## 2016-01-26 ENCOUNTER — Encounter: Payer: Self-pay | Admitting: *Deleted

## 2016-01-26 ENCOUNTER — Ambulatory Visit: Payer: Medicaid Other | Admitting: Internal Medicine

## 2016-01-26 NOTE — Progress Notes (Signed)
Letter mailed

## 2016-09-11 ENCOUNTER — Encounter (HOSPITAL_COMMUNITY): Payer: Self-pay

## 2017-01-06 ENCOUNTER — Encounter (HOSPITAL_COMMUNITY): Payer: Self-pay | Admitting: Emergency Medicine

## 2017-01-06 ENCOUNTER — Emergency Department (HOSPITAL_COMMUNITY)
Admission: EM | Admit: 2017-01-06 | Discharge: 2017-01-06 | Disposition: A | Discharge status: Home / Self Care | Payer: Medicaid Other | Attending: Emergency Medicine | Admitting: Emergency Medicine

## 2017-01-06 DIAGNOSIS — Z79899 Other long term (current) drug therapy: Secondary | ICD-10-CM | POA: Insufficient documentation

## 2017-01-06 DIAGNOSIS — L0291 Cutaneous abscess, unspecified: Secondary | ICD-10-CM

## 2017-01-06 DIAGNOSIS — L02416 Cutaneous abscess of left lower limb: Secondary | ICD-10-CM | POA: Insufficient documentation

## 2017-01-06 DIAGNOSIS — Z87891 Personal history of nicotine dependence: Secondary | ICD-10-CM | POA: Insufficient documentation

## 2017-01-06 MED ORDER — LIDOCAINE HCL (PF) 1 % IJ SOLN
5.0000 mL | Freq: Once | INTRAMUSCULAR | Status: AC
  Administered 2017-01-06: 5 mL
  Filled 2017-01-06: qty 5

## 2017-01-06 NOTE — Discharge Instructions (Signed)
Use a warm moist compress on the sore area 3 or 4 times a day for 30-45 minutes.  Begin that process this evening.  After soaking, using a light bandage on the area because it will continue to drain and bleed.  Take Tylenol or ibuprofen for pain.  Return here for checkup if not better in 2 or 3 days.

## 2017-01-06 NOTE — ED Provider Notes (Signed)
[facility]-EMERGENCY DEPT Provider Note   CSN: [id number] Arrival date & time: 01/06/17  1817   By signing my name below, I, Clarisse GougeXavier Herndon, attest that this documentation has been prepared under the direction and in the presence of Mancel BaleWentz, Gerarda Conklin, MD. Electronically signed, Clarisse GougeXavier Herndon, ED Scribe. 01/06/17. 6:42 PM.  History   Chief Complaint Chief Complaint  Patient presents with  . Insect Bite   The history is provided by the patient and medical records. No language interpreter was used.    Jenny Myers is a 26 y.o. female presenting to the Emergency Department concerning a gradually growing, painful, red bump to the R thigh just lateral to the knee she noticed 2 days ago. She believes she was bitten by an insect of some kind. Pt described 9/10, intermittent aching to the area, especially with contact or application of pressure to the affected area. No tick bites or removals recently. No SOB or any other complaints noted at this time.   Past Medical History:  Diagnosis Date  . 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 Ectopic pregnancy 11/2010   Right salpingectomy  . Infection    chlamydia  . Myocardial infarction (HCC)   . Pericarditis 05/2015  . Umbilical hernia     Patient Active Problem List   Diagnosis Date Noted  . Active labor 12/22/2015  . Maternal anemia in pregnancy, antepartum 12/17/2015  . Cardiac condition complicating pregnancy, antepartum 12/15/2015  . Maternal drug use complicating pregnancy, antepartum 12/15/2015  . Preterm contractions 12/07/2015  . Supervision of high risk pregnancy, antepartum 11/15/2015  . Gastroesophageal reflux disease without esophagitis   . Chest pain 06/26/2015  . Pericarditis   . Acute pericarditis 05/19/2015  . Acute coronary syndrome (HCC) 11/17/2014  . STEMI (ST elevation myocardial infarction) (HCC) 11/17/2014  . 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 Marijuana abuse  07/21/2014  . Family history of breast cancer in female 07/15/2014  . Umbilical hernia 07/15/2014    Past Surgical History:  Procedure Laterality Date  . CARDIAC CATHETERIZATION N/A 11/17/2014   Procedure: Left Heart Cath and Coronary Angiography;  Surgeon: Lennette Biharihomas A Kelly, MD;  [facility];  Service: Cardiovascular;  Laterality: N/A;  . CARDIAC CATHETERIZATION N/A 11/17/2014   Procedure: Left Heart Cath and Coronary Angiography;  Surgeon: Lennette Biharihomas A Kelly, MD;  Location: [facility] INVASIVE CV LAB;  Service: Cardiovascular;  Laterality: N/A;  . INDUCED ABORTION    . SALPINGECTOMY  2012   left ovary, ruptured ectopic  . WISDOM TOOTH EXTRACTION      OB History    Gravida Para Term Preterm AB Living   7 3 3  0 3 3   SAB TAB Ectopic Multiple Live Births   1 1 1  0 3       Home Medications    Prior to Admission medications   Medication Sig Start Date End Date Taking? Authorizing Provider  aspirin EC 81 MG EC tablet Take 1 tablet (81 mg total) by mouth daily. 11/22/14   Dwana MelenaHager, Bryan W, PA-C  clopidogrel (PLAVIX) 75 MG tablet Take 75 mg by mouth daily.    [provider]  Iron-FA-B Cmp-C-Biot-Probiotic (FUSION PLUS) CAPS Take 1 capsule by mouth daily. 12/15/15   Marlis EdelsonKarim, Walidah N, CNM  nitroGLYCERIN (NITROSTAT) 0.4 MG SL tablet Place 0.4 mg under the tongue every 5 (five) minutes as needed for chest pain (x 3 pills daily). Reported on 11/15/2015  [provider]  Prenatal Vit-Fe Fumarate-FA (PRENATAL MULTIVITAMIN) TABS tablet Take 1 tablet by mouth daily at 12 noon.     [provider]    Family History Family History  Problem Relation Age of Onset  . Hypertension Mother   . Diabetes Father   . Hypertension Father   . Cancer Father        breast  . Vision loss Paternal Grandmother   . Other Neg Hx     Social History Social History  Substance Use Topics  . Smoking status: Former Games developer  . Smokeless tobacco: Never Used     Comment: quit with preg    . Alcohol use Yes     Comment: occ     Allergies   Garlic; Penicillins; Carrot flavor; and Fentanyl   Review of Systems Review of Systems  Constitutional: Negative for chills and fever.  Respiratory: Negative for shortness of breath.   Gastrointestinal: Negative for nausea and vomiting.  Skin: Positive for color change. Negative for wound.  Neurological: Negative for dizziness, weakness, numbness and headaches.  All other systems reviewed and are negative.    Physical Exam Updated Vital Signs BP (!) 125/105 (BP Location: Right Arm)   Pulse 92   Temp 98.2 F (36.8 C) (Oral)   Resp 16   Ht 5' (1.524 m)   Wt 125 lb (56.7 kg)   LMP 12/24/2016   SpO2 100%   BMI 24.41 kg/m   Physical Exam  Constitutional: She is oriented to person, place, and time. She appears well-developed and well-nourished.  HENT:  Head: Normocephalic.  Eyes: EOM are normal.  Neck: Normal range of motion.  Pulmonary/Chest: Effort normal.  Abdominal: She exhibits no distension.  Musculoskeletal: Normal range of motion.  Neurological: She is alert and oriented to person, place, and time.  Skin: There is erythema.  4 x 5 area of induration with central nodule measuring 1.5 cm, no proximal streaking.  Psychiatric: She has a normal mood and affect.  Nursing note and vitals reviewed.    ED Treatments / Results  DIAGNOSTIC STUDIES: Oxygen Saturation is 100% on RA, NL by my interpretation.    COORDINATION OF CARE: 6:37 PM-Discussed next steps with pt. Pt verbalized understanding and is agreeable with the plan. Pt prepared for I&D.   Labs (all labs ordered are listed, but only abnormal results are displayed) Labs Reviewed - No data to display  EKG  EKG Interpretation None       Radiology No results found.  Procedures .Marland KitchenIncision and Drainage Date/Time: 01/06/2017 6:46 PM Performed by: Mancel Bale Authorized by: Mancel Bale   Consent:    Consent obtained:  Verbal   Consent  given by:  Patient   Risks discussed:  Incomplete drainage, infection and bleeding Location:    Type:  Abscess   Size:  1.5 cm   Location:  Lower extremity   Lower extremity location:  Leg   Leg location:  R upper leg Anesthesia (see MAR for exact dosages):    Anesthesia method:  Local infiltration   Local anesthetic:  Lidocaine 1% w/o epi Procedure type:    Complexity:  Simple Procedure details:    Incision types:  Single straight   Scalpel blade:  11   Wound management:  Probed and deloculated   Drainage:  Purulent   Drainage amount:  Moderate   Wound treatment:  Wound left open   Packing materials:  None Post-procedure details:    Patient tolerance of procedure:  Tolerated well, no immediate complications    (including critical care time)  Medications Ordered in ED Medications  lidocaine (PF) (XYLOCAINE) 1 % injection 5 mL (5 mLs Infiltration Given by Other 01/06/17 1844)     Initial Impression / Assessment and Plan / ED Course  I have reviewed the triage vital signs and the nursing notes.  Pertinent labs & imaging results that were available during my care of the patient were reviewed by me and considered in my medical decision making (see chart for details).      Patient Vitals for the past 24 hrs:  BP Temp Temp src Pulse Resp SpO2 Height Weight  01/06/17 1822 (!) 125/105 98.2 F (36.8 C) Oral 92 16 100 % - -  01/06/17 1819 - - - - - - 5' (1.524 m) 56.7 kg (125 lb)    6:58 PM Reevaluation with update and discussion. After initial assessment and treatment, an updated evaluation reveals patient more comfortable after drainage procedure.  Findings discussed with the patient and all questions were answered. Caius Silbernagel L    Final Clinical Impressions(s) / ED Diagnoses   Final diagnoses:  Abscess    Leg abscess, with mild surrounding induration and erythema.  Doubt lymphangitis, deep tissue infection or metabolic instability.  Abscesses small and superficial,  around 2 cm.  It did not require packing.  Oral antibiotics not indicated at this time.  Nursing Notes Reviewed/ Care Coordinated Applicable Imaging Reviewed Interpretation of Laboratory Data incorporated into ED treatment  The patient appears reasonably screened and/or stabilized for discharge and I doubt any other medical condition or other [facility] requiring further screening, evaluation, or treatment in the ED at this time prior to discharge.  Plan: Home Medications-ibuprofen for pain; Home Treatments-warm soaks 3 times daily; return here if the recommended treatment, does not improve the symptoms; Recommended follow up-return here if not better in 2-3 days.   New Prescriptions New Prescriptions   No medications on file  I personally performed the services described in this documentation, which was scribed in my presence. The recorded information has been reviewed and is accurate.       Mancel Bale, MD 01/06/17 1901

## 2017-01-06 NOTE — ED Notes (Signed)
See EDP secondary assessment.  

## 2017-01-06 NOTE — ED Triage Notes (Signed)
Pt states she was bitten by a spider on Friday. Pt has red raised area on right leg.

## 2017-12-27 ENCOUNTER — Other Ambulatory Visit: Payer: Self-pay

## 2017-12-27 ENCOUNTER — Encounter (HOSPITAL_COMMUNITY): Payer: Self-pay

## 2017-12-27 ENCOUNTER — Emergency Department (HOSPITAL_COMMUNITY): Payer: Self-pay

## 2017-12-27 ENCOUNTER — Emergency Department (HOSPITAL_COMMUNITY)
Admission: EM | Admit: 2017-12-27 | Discharge: 2017-12-28 | Disposition: A | Discharge status: Home / Self Care | Payer: Self-pay | Attending: Emergency Medicine | Admitting: Emergency Medicine

## 2017-12-27 DIAGNOSIS — I2542 Coronary artery dissection: Secondary | ICD-10-CM | POA: Insufficient documentation

## 2017-12-27 DIAGNOSIS — Z79899 Other long term (current) drug therapy: Secondary | ICD-10-CM | POA: Insufficient documentation

## 2017-12-27 DIAGNOSIS — R0789 Other chest pain: Secondary | ICD-10-CM | POA: Insufficient documentation

## 2017-12-27 DIAGNOSIS — Z7982 Long term (current) use of aspirin: Secondary | ICD-10-CM | POA: Insufficient documentation

## 2017-12-27 DIAGNOSIS — I252 Old myocardial infarction: Secondary | ICD-10-CM | POA: Insufficient documentation

## 2017-12-27 DIAGNOSIS — Z87891 Personal history of nicotine dependence: Secondary | ICD-10-CM | POA: Insufficient documentation

## 2017-12-27 DIAGNOSIS — Z7902 Long term (current) use of antithrombotics/antiplatelets: Secondary | ICD-10-CM | POA: Insufficient documentation

## 2017-12-27 LAB — BASIC METABOLIC PANEL
Anion gap: 11 (ref 5–15)
BUN: 13 mg/dL (ref 6–20)
CALCIUM: 9.6 mg/dL (ref 8.9–10.3)
CO2: 18 mmol/L — ABNORMAL LOW (ref 22–32)
CREATININE: 0.75 mg/dL (ref 0.44–1.00)
Chloride: 111 mmol/L (ref 98–111)
GFR calc Af Amer: >60 mL/min (ref >60)
GLUCOSE: 79 mg/dL (ref 70–99)
Potassium: 3.2 mmol/L — ABNORMAL LOW (ref 3.5–5.1)
Sodium: 140 mmol/L (ref 135–145)

## 2017-12-27 LAB — CBC
HCT: 41.4 % (ref 36.0–46.0)
Hemoglobin: 13.1 g/dL (ref 12.0–15.0)
MCH: 29.1 pg (ref 26.0–34.0)
MCHC: 31.6 g/dL (ref 30.0–36.0)
MCV: 92 fL (ref 78.0–100.0)
Platelets: 387 10*3/uL (ref 150–400)
RBC: 4.5 MIL/uL (ref 3.87–5.11)
RDW: 14.7 % (ref 11.5–15.5)
WBC: 8.5 10*3/uL (ref 4.0–10.5)

## 2017-12-27 LAB — I-STAT TROPONIN, ED: TROPONIN I, POC: 0 ng/mL (ref 0.00–0.08)

## 2017-12-27 LAB — I-STAT BETA HCG BLOOD, ED (MC, WL, AP ONLY): I-stat hCG, quantitative: <5 m[IU]/mL (ref <5)

## 2017-12-27 MED ORDER — HYDROMORPHONE HCL 1 MG/ML IJ SOLN
1.0000 mg | Freq: Once | INTRAMUSCULAR | Status: AC
  Administered 2017-12-27: 1 mg via INTRAVENOUS
  Filled 2017-12-27: qty 1

## 2017-12-27 MED ORDER — MORPHINE SULFATE (PF) 4 MG/ML IV SOLN
4.0000 mg | Freq: Once | INTRAVENOUS | Status: AC
  Administered 2017-12-27: 4 mg via INTRAVENOUS
  Filled 2017-12-27: qty 1

## 2017-12-27 NOTE — ED Provider Notes (Signed)
[facility] EMERGENCY DEPARTMENT Provider Note   CSN: [id number] Arrival date & time: 12/27/17  2206     History   Chief Complaint Chief Complaint  Patient presents with  . Chest Pain    HPI Jenny Myers is a 27 y.o. female.  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  She states that the symptoms started about 2 hours ago.  She states that this feels similar to when she had her prior MI and dissection.  She reports that both of her grandmothers also had MIs in their early 75s.  She states that the pain is constant and central.  She reports mild SOB.  She states that she has also had cough, nausea, vomiting, and diarrhea today.  She has tried taking nitroglycerin with no change in symptoms.    The history is provided by the patient. No language interpreter was used.    Past Medical History:  Diagnosis Date  . 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 Ectopic pregnancy 11/2010   Right salpingectomy  . Infection    chlamydia  . Myocardial infarction (HCC)   . Pericarditis 05/2015  . Umbilical hernia     Patient Active Problem List   Diagnosis Date Noted  . Active labor 12/22/2015  . Maternal anemia in pregnancy, antepartum 12/17/2015  . Cardiac condition complicating pregnancy, antepartum 12/15/2015  . Maternal drug use complicating pregnancy, antepartum 12/15/2015  . Preterm contractions 12/07/2015  . Supervision of high risk pregnancy, antepartum 11/15/2015  . Gastroesophageal reflux disease without esophagitis   . Chest pain 06/26/2015  . Pericarditis   . Acute pericarditis 05/19/2015  . Acute coronary syndrome (HCC) 11/17/2014  . STEMI (ST elevation myocardial infarction) (HCC) 11/17/2014  . 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 Marijuana abuse 07/21/2014  . Family history of  breast cancer in female 07/15/2014  . Umbilical hernia 07/15/2014    Past Surgical History:  Procedure Laterality Date  . CARDIAC CATHETERIZATION N/A 11/17/2014   Procedure: Left Heart Cath and Coronary Angiography;  Surgeon: Lennette Bihari, MD;  Location: [facility] INVASIVE CV LAB;  Service: Cardiovascular;  Laterality: N/A;  . CARDIAC CATHETERIZATION N/A 11/17/2014   Procedure: Left Heart Cath and Coronary Angiography;  Surgeon: Lennette Bihari, MD;  Location: [facility] INVASIVE CV LAB;  Service: Cardiovascular;  Laterality: N/A;  . INDUCED ABORTION    . SALPINGECTOMY  2012   left ovary, ruptured ectopic  . WISDOM TOOTH EXTRACTION       OB History    Gravida  7   Para  3   Term  3   Preterm  0   AB  3   Living  3     SAB  1   TAB  1   Ectopic  1   Multiple  0   Live Births  3            Home Medications    Prior to Admission medications   Medication Sig Start Date End Date Taking? Authorizing Provider  aspirin EC 81 MG EC tablet Take 1 tablet (81 mg total) by mouth daily. 11/22/14   Dwana Melena, PA-C  clopidogrel (PLAVIX) 75 MG tablet Take 75 mg by mouth daily.    [provider]  Iron-FA-B Cmp-C-Biot-Probiotic (FUSION PLUS) CAPS Take 1 capsule  by mouth daily. 12/15/15   Marlis Edelson, CNM  nitroGLYCERIN (NITROSTAT) 0.4 MG SL tablet Place 0.4 mg under the tongue every 5 (five) minutes as needed for chest pain (x 3 pills daily). Reported on 11/15/2015    [provider]  Prenatal Vit-Fe Fumarate-FA (PRENATAL MULTIVITAMIN) TABS tablet Take 1 tablet by mouth daily at 12 noon.     [provider]    Family History Family History  Problem Relation Age of Onset  . Hypertension Mother   . Diabetes Father   . Hypertension Father   . Cancer Father        breast  . Vision loss Paternal Grandmother   . Other Neg Hx     Social History Social History   Tobacco Use  . Smoking status: Former Games developer  . Smokeless tobacco: Never Used  . Tobacco  comment: quit with preg  Substance Use Topics  . Alcohol use: Yes    Comment: occ  . Drug use: No     Allergies   Garlic; Penicillins; Carrot flavor; and Fentanyl   Review of Systems Review of Systems  All other systems reviewed and are negative.    Physical Exam Updated Vital Signs Ht 5' (1.524 m)   Wt 47.2 kg (104 lb)   LMP 12/26/2017   SpO2 99%   BMI 20.31 kg/m   Physical Exam  Constitutional: She is oriented to person, place, and time. She appears well-developed and well-nourished.  HENT:  Head: Normocephalic and atraumatic.  Eyes: Pupils are equal, round, and reactive to light. Conjunctivae and EOM are normal.  Neck: Normal range of motion. Neck supple.  Cardiovascular: Normal rate and regular rhythm. Exam reveals no gallop and no friction rub.  No murmur heard. Pulmonary/Chest: Effort normal and breath sounds normal. No respiratory distress. She has no wheezes. She has no rales. She exhibits no tenderness.  Abdominal: Soft. Bowel sounds are normal. She exhibits no distension and no mass. There is no tenderness. There is no rebound and no guarding.  Musculoskeletal: Normal range of motion. She exhibits no edema or tenderness.  Neurological: She is alert and oriented to person, place, and time.  Skin: Skin is warm and dry.  Psychiatric: She has a normal mood and affect. Her behavior is normal. Judgment and thought content normal.  Nursing note and vitals reviewed.    ED Treatments / Results  Labs (all labs ordered are listed, but only abnormal results are displayed) Labs Reviewed  BASIC METABOLIC PANEL - Abnormal; Notable for the following components:      Result Value   Potassium 3.2 (*)    CO2 18 (*)    All other components within normal limits  CBC  I-STAT TROPONIN, ED  I-STAT BETA HCG BLOOD, ED ([facility], [facility], [facility] ONLY)  I-STAT TROPONIN, ED    EKG EKG Interpretation  Date/Time:  Saturday December 28 2017 00:43:03 EDT Ventricular Rate:  92 PR Interval:      QRS Duration: 96 QT Interval:  345 QTC Calculation: 427 R Axis:   81 Text Interpretation:  Atrial flutter Low voltage, precordial leads Nonspecific T abnormalities, inferior leads No significant change from 12/22/2015 Confirmed by Geoffery Lyons ([id number]) on 12/28/2017 1:18:23 AM   Radiology Dg Chest Port 1 View  Result Date: 12/27/2017 CLINICAL DATA:  27 y/o  F; chest pain.  Multiple episodes of emesis. EXAM: PORTABLE CHEST 1 VIEW COMPARISON:  06/25/2015 chest radiograph FINDINGS: Stable heart size and mediastinal contours are within normal limits. Both  lungs are clear. The visualized skeletal structures are unremarkable. IMPRESSION: No active disease. Electronically Signed   By: Mitzi HansenLance  Furusawa-Stratton M.D.   On: 12/27/2017 22:45   Ct Angio Chest/abd/pel For Dissection W And/or W/wo  Result Date: 12/28/2017 CLINICAL DATA:  Chest pain. History of left anterior descending artery dissection. EXAM: CT ANGIOGRAPHY CHEST, ABDOMEN AND PELVIS TECHNIQUE: Multidetector CT imaging through the chest, abdomen and pelvis was performed using the standard protocol during bolus administration of intravenous contrast. Multiplanar reconstructed images and MIPs were obtained and reviewed to evaluate the vascular anatomy. CONTRAST:  100mL ISOVUE-370 IOPAMIDOL (ISOVUE-370) INJECTION 76% COMPARISON:  None. FINDINGS: CTA CHEST FINDINGS Cardiovascular: The heart size is normal. There is nopericardial effusion. The course and caliber of the thoracic aorta are normal. There is no aortic atherosclerotic calcification. Precontrast images show no aortic intramural hematoma. There is no blood pool, dissection or penetrating ulcer demonstrated on arterial phase postcontrast imaging. Normal variant aortic arch branching pattern with the left vertebral artery arising independently from the aortic arch. The proximal arch vessels are widely patent. The central pulmonary arteries are normal. Mediastinum/Nodes: No mediastinal, hilar or  axillary lymphadenopathy. The visualized thyroid and thoracic esophageal course are unremarkable. Lungs/Pleura: Multiple 2-4 mm pulmonary nodules within both lungs. No pleural effusion or pneumothorax. Musculoskeletal: No chest wall abnormality. No acute osseous findings. Review of the MIP images confirms the above findings. CTA ABDOMEN AND PELVIS FINDINGS VASCULAR Aorta: Normal caliber aorta without aneurysm, dissection, vasculitis or hemodynamically significant stenosis. There is no aortic atherosclerosis. Celiac: No aneurysm, dissection or hemodynamically significant stenosis. Normal branching pattern. SMA: Widely patent without dissection or stenosis. Renals: Single renal arteries bilaterally. No aneurysm, dissection, stenosis or evidence of fibromuscular dysplasia. IMA: Patent without abnormality. Inflow: Minimal atherosclerotic calcification without stenosis or other abnormality. Veins: Normal course and caliber of the major veins. Assessment is otherwise limited by the arterial dominant contrast phase. Review of the MIP images confirms the above findings. NON-VASCULAR Hepatobiliary: Normal hepatic contours and density. No visible biliary dilatation. Normal gallbladder. Pancreas: Normal contours without ductal dilatation. No peripancreatic fluid collection. Spleen: Normal arterial phase splenic enhancement pattern. Adrenals/Urinary Tract: --Adrenal glands: Normal. --Right kidney/ureter: No hydronephrosis or perinephric stranding. No nephrolithiasis. No obstructing ureteral stones. --Left kidney/ureter: No hydronephrosis or perinephric stranding. No nephrolithiasis. No obstructing ureteral stones. --Urinary bladder: Unremarkable. Stomach/Bowel: --Stomach/Duodenum: No hiatal hernia or other gastric abnormality. Normal duodenal course and caliber. --Small bowel: No dilatation or inflammation. --Colon: No focal abnormality. --Appendix: Normal. Lymphatic:  No abdominal or pelvic lymphadenopathy. Reproductive: No  free fluid in the pelvis. Musculoskeletal. No bony spinal canal stenosis or focal osseous abnormality. Other: None. Review of the MIP images confirms the above findings. IMPRESSION: No acute aortic syndrome or other acute abnormality of the chest, abdomen or pelvis. Electronically Signed   By: Deatra RobinsonKevin  Herman M.D.   On: 12/28/2017 01:06    Procedures Procedures (including critical care time)  Medications Ordered in ED Medications  morphine 4 MG/ML injection 4 mg (has no administration in time range)     Initial Impression / Assessment and Plan / ED Course  I have reviewed the triage vital signs and the nursing notes.  Pertinent labs & imaging results that were available during my care of the patient were reviewed by me and considered in my medical decision making (see chart for details).    Patient with chest pain that started around 8 AM yesterday.  She has had associated nausea, vomiting, diarrhea.  She has a history of prior  MIs secondary to LAD dissection postpartum.  She does not have any fevers or chills.  Denies any shortness of breath.  Initial troponin is negative.  Patient discussed with Dr. Jodi Mourning, who recommends CT dissection study.  CT is reassuring.  Repeat EKG and repeat troponin are reassuring.  Doubt ACS or cardiac etiology of the patient's symptoms.  Heart score is 3.  I would have expected some change in her troponin or EKG at this point.  Feel that she is stable and appropriate for outpatient follow-up.  Patient discussed with Dr. Judd Lien, who also agrees with the plan.   Final Clinical Impressions(s) / ED Diagnoses   Final diagnoses:  Other chest pain    ED Discharge Orders        Ordered    ondansetron (ZOFRAN ODT) 4 MG disintegrating tablet  Every 8 hours PRN     12/28/17 0226    omeprazole (PRILOSEC) 20 MG capsule  Daily     12/28/17 0226       Roxy Horseman, PA-C 12/28/17 1610    Geoffery Lyons, MD 12/28/17 [phone]

## 2017-12-27 NOTE — ED Triage Notes (Signed)
Per [facility] pt with CP 10/10 AT 8AM. Reports multiple emesis, none with EMS. Hx MI x2 no stents placed. Received 324 aspirin an 0.4 niro, inneffective.

## 2017-12-28 ENCOUNTER — Emergency Department (HOSPITAL_COMMUNITY): Payer: Self-pay

## 2017-12-28 LAB — I-STAT TROPONIN, ED: TROPONIN I, POC: 0 ng/mL (ref 0.00–0.08)

## 2017-12-28 MED ORDER — ONDANSETRON HCL 4 MG/2ML IJ SOLN
4.0000 mg | Freq: Once | INTRAMUSCULAR | Status: AC
  Administered 2017-12-28: 4 mg via INTRAVENOUS
  Filled 2017-12-28: qty 2

## 2017-12-28 MED ORDER — OMEPRAZOLE 20 MG PO CPDR: 20.0000 mg | DELAYED_RELEASE_CAPSULE | Freq: Every day | ORAL | 0 refills | Status: AC

## 2017-12-28 MED ORDER — IOPAMIDOL (ISOVUE-370) INJECTION 76%
100.0000 mL | Freq: Once | INTRAVENOUS | Status: AC | PRN
  Administered 2017-12-28: 100 mL via INTRAVENOUS

## 2017-12-28 MED ORDER — IOPAMIDOL (ISOVUE-370) INJECTION 76%
INTRAVENOUS | Status: AC
  Filled 2017-12-28: qty 100

## 2017-12-28 MED ORDER — GI COCKTAIL ~~LOC~~
30.0000 mL | Freq: Once | ORAL | Status: AC
  Administered 2017-12-28: 30 mL via ORAL
  Filled 2017-12-28: qty 30

## 2017-12-28 MED ORDER — ONDANSETRON 4 MG PO TBDP: 4.0000 mg | ORAL_TABLET | Freq: Three times a day (TID) | ORAL | 0 refills | Status: DC | PRN

## 2017-12-28 MED ORDER — HYDROMORPHONE HCL 1 MG/ML IJ SOLN
1.0000 mg | Freq: Once | INTRAMUSCULAR | Status: AC
  Administered 2017-12-28: 1 mg via INTRAVENOUS
  Filled 2017-12-28: qty 1

## 2018-06-13 ENCOUNTER — Encounter (HOSPITAL_COMMUNITY): Payer: Self-pay | Admitting: Emergency Medicine

## 2018-06-13 ENCOUNTER — Emergency Department (HOSPITAL_COMMUNITY): Payer: Self-pay

## 2018-06-13 ENCOUNTER — Emergency Department (HOSPITAL_COMMUNITY)
Admission: EM | Admit: 2018-06-13 | Discharge: 2018-06-14 | Disposition: A | Discharge status: Home / Self Care | Payer: Self-pay | Attending: Emergency Medicine | Admitting: Emergency Medicine

## 2018-06-13 DIAGNOSIS — R079 Chest pain, unspecified: Secondary | ICD-10-CM

## 2018-06-13 DIAGNOSIS — J029 Acute pharyngitis, unspecified: Secondary | ICD-10-CM

## 2018-06-13 DIAGNOSIS — R0789 Other chest pain: Secondary | ICD-10-CM | POA: Insufficient documentation

## 2018-06-13 DIAGNOSIS — I252 Old myocardial infarction: Secondary | ICD-10-CM | POA: Insufficient documentation

## 2018-06-13 DIAGNOSIS — Z87891 Personal history of nicotine dependence: Secondary | ICD-10-CM | POA: Insufficient documentation

## 2018-06-13 LAB — BASIC METABOLIC PANEL
ANION GAP: 9 (ref 5–15)
BUN: 8 mg/dL (ref 6–20)
CALCIUM: 9.6 mg/dL (ref 8.9–10.3)
CO2: 24 mmol/L (ref 22–32)
Chloride: 107 mmol/L (ref 98–111)
Creatinine, Ser: 0.81 mg/dL (ref 0.44–1.00)
Glucose, Bld: 92 mg/dL (ref 70–99)
Potassium: 3.8 mmol/L (ref 3.5–5.1)
SODIUM: 140 mmol/L (ref 135–145)

## 2018-06-13 LAB — GROUP A STREP BY PCR: Group A Strep by PCR: NOT DETECTED

## 2018-06-13 LAB — CBC
HCT: 43.6 % (ref 36.0–46.0)
Hemoglobin: 13.8 g/dL (ref 12.0–15.0)
MCH: 28.9 pg (ref 26.0–34.0)
MCHC: 31.7 g/dL (ref 30.0–36.0)
MCV: 91.2 fL (ref 80.0–100.0)
NRBC: 0 % (ref 0.0–0.2)
PLATELETS: 410 10*3/uL — AB (ref 150–400)
RBC: 4.78 MIL/uL (ref 3.87–5.11)
RDW: 14.6 % (ref 11.5–15.5)
WBC: 8.1 10*3/uL (ref 4.0–10.5)

## 2018-06-13 LAB — D-DIMER, QUANTITATIVE: D-Dimer, Quant: 0.31 ug/mL-FEU (ref 0.00–0.50)

## 2018-06-13 LAB — I-STAT TROPONIN, ED: TROPONIN I, POC: 0 ng/mL (ref 0.00–0.08)

## 2018-06-13 LAB — I-STAT BETA HCG BLOOD, ED (MC, WL, AP ONLY)

## 2018-06-13 MED ORDER — IBUPROFEN 400 MG PO TABS
400.0000 mg | ORAL_TABLET | Freq: Once | ORAL | Status: AC
  Administered 2018-06-13: 400 mg via ORAL
  Filled 2018-06-13: qty 1

## 2018-06-13 NOTE — ED Notes (Signed)
Pt is complaining of dizziness

## 2018-06-13 NOTE — ED Provider Notes (Signed)
[facility] EM[facility]GENCY [facility] Provider Note   CSN: [id number] Arrival date & time: 06/13/18  1921     History   Chief Complaint Chief Complaint  Patient presents with  . Chest Pain  . Sore Throat    HPI Jenny Myers is a 27 y.o. female.  HPI   Patient is a [age] year old female with past medical history of postpartum MI in 2016 from an LAD dissection, pericarditis, umbilical hernia who presents for evaluation of approximately 4 to 6 hours of substernal pleuritic chest pain that is constant and described as sharp.  She states the pain does not radiate but does feel similar to her prior heart attack.  She notes she has a sore throat but denies any significant shortness of breath or cough.  She denies any myalgias, vomiting, diarrhea, dysuria, abdominal pain, rash, extremity weakness/numbness/paresthesias, sick contacts, recent travel out of [place], or other acute complaints.  Denies any alleviating or aggravating factors include any positional factors or exertional factors.  Past Medical History:  Diagnosis Date  . 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 Ectopic pregnancy 11/2010   Right salpingectomy  . Infection    chlamydia  . Myocardial infarction (HCC)   . Pericarditis 05/2015  . Umbilical hernia     Patient Active Problem List   Diagnosis Date Noted  . Active labor 12/22/2015  . Maternal anemia in pregnancy, antepartum 12/17/2015  . Cardiac condition complicating pregnancy, antepartum 12/15/2015  . Maternal drug use complicating pregnancy, antepartum 12/15/2015  . Preterm contractions 12/07/2015  . Supervision of high risk pregnancy, antepartum 11/15/2015  . Gastroesophageal reflux disease without esophagitis   . Chest pain 06/26/2015  . Pericarditis   . Acute pericarditis 05/19/2015  . Acute coronary syndrome (HCC) 11/17/2014  . STEMI (ST elevation myocardial infarction) (HCC) 11/17/2014  . Post partum  STEMI secondary to LAD dissection 11/17/14   . Marijuana abuse 07/21/2014  . Family history of breast cancer in female 07/15/2014  . Umbilical hernia 07/15/2014    Past Surgical History:  Procedure Laterality Date  . CARDIAC CATHETERIZATION N/A 11/17/2014   Procedure: Left Heart Cath and Coronary Angiography;  Surgeon: Lennette Biharihomas A Kelly, MD;  [facility];  Service: Cardiovascular;  Laterality: N/A;  . CARDIAC CATHETERIZATION N/A 11/17/2014   Procedure: Left Heart Cath and Coronary Angiography;  Surgeon: Lennette Biharihomas A Kelly, MD;  Location: [facility] INVASIVE CV LAB;  Service: Cardiovascular;  Laterality: N/A;  . INDUCED ABORTION    . SALPINGECTOMY  2012   left ovary, ruptured ectopic  . WISDOM TOOTH EXTRACTION       OB History    Gravida  7   Para  3   Term  3   Preterm  0   AB  3   Living  3     SAB  1   TAB  1   Ectopic  1   Multiple  0   Live Births  3            Home Medications    Prior to Admission medications   Medication Sig Start Date End Date Taking? Authorizing Provider  nitroGLYCERIN (NITROSTAT) 0.4 MG SL tablet Place 0.4 mg under the tongue every 5 (five) minutes as needed for chest pain (x 3 pills daily). Reported on 11/15/2015   Yes [provider]  aspirin EC 81 MG EC tablet Take 1 tablet (81 mg total) by mouth daily. Patient not taking: Reported  on 12/27/2017 11/22/14   Dwana Melena, PA-C  Iron-FA-B Cmp-C-Biot-Probiotic (FUSION PLUS) CAPS Take 1 capsule by mouth daily. Patient not taking: Reported on 12/27/2017 12/15/15   Amedeo Gory, CNM  omeprazole (PRILOSEC) 20 MG capsule Take 1 capsule (20 mg total) by mouth daily. Patient not taking: Reported on 06/13/2018 12/28/17   Roxy Horseman, PA-C  oseltamivir (TAMIFLU) 75 MG capsule Take 1 capsule (75 mg total) by mouth every 12 (twelve) hours. 06/14/18   Antoine Primas, MD  oxyCODONE-acetaminophen (PERCOCET/ROXICET) 5-325 MG tablet Take 1 tablet by mouth every 4 (four) hours as  needed for up to 5 doses for severe pain. 06/14/18   Antoine Primas, MD    Family History Family History  Problem Relation Age of Onset  . Hypertension Mother   . Diabetes Father   . Hypertension Father   . Cancer Father        breast  . Vision loss Paternal Grandmother   . Other Neg Hx     Social History Social History   Tobacco Use  . Smoking status: Former Games developer  . Smokeless tobacco: Never Used  . Tobacco comment: quit with preg  Substance Use Topics  . Alcohol use: Yes    Comment: occ  . Drug use: No     Allergies   Garlic; Penicillins; and Carrot flavor   Review of Systems Review of Systems  Constitutional: Negative for chills and fever.  HENT: Positive for sore throat. Negative for ear pain.   Eyes: Negative for pain and visual disturbance.  Respiratory: Negative for cough and shortness of breath.   Cardiovascular: Positive for chest pain. Negative for palpitations.  Gastrointestinal: Negative for abdominal pain and vomiting.  Genitourinary: Negative for dysuria and hematuria.  Musculoskeletal: Negative for arthralgias and back pain.  Skin: Negative for color change and rash.  Neurological: Negative for seizures and syncope.  All other systems reviewed and are negative.    Physical Exam Updated Vital Signs BP 129/84   Pulse 88   Temp 99.4 F (37.4 C) (Oral)   Resp 15   Ht 5' (1.524 m)   Wt 54.4 kg   SpO2 100%   BMI 23.44 kg/m   Physical Exam Vitals signs and nursing note reviewed.  Constitutional:      General: She is not in acute distress.    Appearance: She is well-developed.  HENT:     Head: Normocephalic and atraumatic.     Mouth/Throat:     Pharynx: Posterior oropharyngeal erythema present.  Eyes:     Conjunctiva/sclera: Conjunctivae normal.  Neck:     Musculoskeletal: Neck supple.  Cardiovascular:     Rate and Rhythm: Regular rhythm. Tachycardia present.     Pulses:          Radial pulses are 2+ on the right side and 2+ on the  left side.       Dorsalis pedis pulses are 2+ on the right side and 2+ on the left side.     Heart sounds: No murmur.  Pulmonary:     Effort: Pulmonary effort is normal. No respiratory distress.     Breath sounds: Normal breath sounds.  Abdominal:     Palpations: Abdomen is soft.     Tenderness: There is no abdominal tenderness.  Skin:    General: Skin is warm and dry.  Neurological:     Mental Status: She is alert.      ED Treatments / Results  Labs (all labs ordered are  listed, but only abnormal results are displayed) Labs Reviewed  CBC - Abnormal; Notable for the following components:      Result Value   Platelets 410 (*)    All other components within normal limits  GROUP A STREP BY PCR  BASIC METABOLIC PANEL  D-DIMER, QUANTITATIVE (NOT AT [facility])  INFLUENZA PANEL BY PCR (TYPE A & B)  I-STAT TROPONIN, ED  I-STAT BETA HCG BLOOD, ED ([facility], [facility], [facility] ONLY)  I-STAT TROPONIN, ED    EKG EKG Interpretation  Date/Time:  Friday June 13 2018 19:26:50 EST Ventricular Rate:  110 PR Interval:  128 QRS Duration: 66 QT Interval:  308 QTC Calculation: 416 R Axis:   82 Text Interpretation:  Sinus tachycardia Possible Left atrial enlargement T wave abnormality, consider inferior ischemia Abnormal ECG When compared to prior, faster rate.  No STEMI Confirmed by Theda Belfast ([id number]) on 06/13/2018 9:59:15 PM   Radiology Dg Chest 2 View  Result Date: 06/13/2018 CLINICAL DATA:  Substernal chest pain, shortness of breath, nausea, and dizziness. EXAM: CHEST - 2 VIEW COMPARISON:  12/27/2017 FINDINGS: The cardiomediastinal silhouette is within normal limits. The lungs are well inflated and clear. There is no evidence of pleural effusion or pneumothorax. No acute osseous abnormality is identified. IMPRESSION: No active cardiopulmonary disease. Electronically Signed   By: Sebastian Ache M.D.   On: 06/13/2018 20:05    Procedures Procedures (including critical care time)  Medications  Ordered in ED Medications  ibuprofen (ADVIL,MOTRIN) tablet 400 mg (400 mg Oral Given 06/13/18 2257)  oxyCODONE-acetaminophen (PERCOCET/ROXICET) 5-325 MG per tablet 1 tablet (1 tablet Oral Given 06/14/18 0035)     Initial Impression / Assessment and Plan / ED Course  I have reviewed the triage vital signs and the nursing notes.  Pertinent labs & imaging results that were available during my care of the patient were reviewed by me and considered in my medical decision making (see chart for details).     Patient is a 27 year old female who presents above-stated history exam.  On presentation patient is noted to be mild tachycardic to 107, febrile to 101, with otherwise stable vital signs.  Exam as above remarkable for mild tachycardia and posterior erythema of the pharynx.  Patient has lungs clear to auscultation bilaterally and intact radial pulses.  Her abdomen is soft and nontender.  Regarding the patient's chest pain doubt pneumonia or pneumothorax given chest x-ray does not show any focal consolidations or collapsed lung.  Doubt PE as patient's d-dimer is within normal limits.  I have low suspicion for ACS given patient's EKG is consistent with prior tracings without any findings consistent with acute ischemia and patient had an initial and 3-hour troponin that were both nonelevated.  In addition the patient's fever and pharyngitis are more consistent with a viral URI.  Rapid strep is negative for group A strep.  Pregnancy test is negative.  CBC shows WBCs of 8.1, hemoglobin 13.8, platelets 410.  BMP WNL without any significant electrolyte abnormalities and anion gap of 8.  Patient was given p.o. antipyretic in the ED her fever was noted to defervesce and her heart rate decreased into the 90s.   Patient discharged in stable condition.  Strict return precautions advised and discussed.  Patient given prescriptions for p.o. analgesia and p.o. Tamiflu to be filled if her influenza test come back  positive.  Patient instructed to follow-up with her PCP and cardiologist in the next 2 to 3 days patient voiced agreement understanding with this plan.  Final Clinical Impressions(s) / ED Diagnoses   Final diagnoses:  Sore throat  Chest pain, unspecified type    ED Discharge Orders         Ordered    oseltamivir (TAMIFLU) 75 MG capsule  Every 12 hours     06/14/18 0047    oxyCODONE-acetaminophen (PERCOCET/ROXICET) 5-325 MG tablet  Every 4 hours PRN     06/14/18 0047           Antoine PrimasSmith, , MD 06/14/18 [id number]    Tegeler, Canary Brimhristopher J, MD 06/14/18 0230

## 2018-06-13 NOTE — ED Triage Notes (Signed)
Pt reports substernal non-radiating stabbing chest pain over the past four hours.  She also complains of sore throat.  Pt has had two "heart attacks" in the past.  Pt is slightly short of breath "from pain."

## 2018-06-14 LAB — I-STAT TROPONIN, ED: TROPONIN I, POC: 0 ng/mL (ref 0.00–0.08)

## 2018-06-14 LAB — INFLUENZA PANEL BY PCR (TYPE A & B)
Influenza A By PCR: NEGATIVE
Influenza B By PCR: NEGATIVE

## 2018-06-14 MED ORDER — OXYCODONE-ACETAMINOPHEN 5-325 MG PO TABS
1.0000 | ORAL_TABLET | Freq: Once | ORAL | Status: AC
  Administered 2018-06-14: 1 via ORAL
  Filled 2018-06-14: qty 1

## 2018-06-14 MED ORDER — OSELTAMIVIR PHOSPHATE 75 MG PO CAPS: 75.0000 mg | ORAL_CAPSULE | Freq: Two times a day (BID) | ORAL | 0 refills | Status: DC

## 2018-06-14 MED ORDER — OXYCODONE-ACETAMINOPHEN 5-325 MG PO TABS: 1.0000 | ORAL_TABLET | ORAL | 0 refills | Status: AC | PRN

## 2019-07-12 ENCOUNTER — Inpatient Hospital Stay (HOSPITAL_COMMUNITY)
Admission: EM | Admit: 2019-07-12 | Discharge: 2019-07-20 | DRG: 064 | Disposition: E | Payer: Medicaid Other | Attending: Pulmonary Disease | Admitting: Pulmonary Disease

## 2019-07-12 ENCOUNTER — Inpatient Hospital Stay (HOSPITAL_COMMUNITY): Payer: Medicaid Other

## 2019-07-12 ENCOUNTER — Emergency Department (HOSPITAL_COMMUNITY): Payer: Medicaid Other

## 2019-07-12 ENCOUNTER — Ambulatory Visit (HOSPITAL_COMMUNITY)
Admission: EM | Admit: 2019-07-12 | Discharge: 2019-07-12 | Disposition: A | Discharge status: Home / Self Care | Payer: No Typology Code available for payment source | Attending: Emergency Medicine | Admitting: Emergency Medicine

## 2019-07-12 ENCOUNTER — Encounter (HOSPITAL_COMMUNITY): Payer: Self-pay | Admitting: *Deleted

## 2019-07-12 DIAGNOSIS — J9601 Acute respiratory failure with hypoxia: Secondary | ICD-10-CM | POA: Diagnosis present

## 2019-07-12 DIAGNOSIS — S0003XA Contusion of scalp, initial encounter: Secondary | ICD-10-CM | POA: Diagnosis present

## 2019-07-12 DIAGNOSIS — I454 Nonspecific intraventricular block: Secondary | ICD-10-CM | POA: Diagnosis present

## 2019-07-12 DIAGNOSIS — Z515 Encounter for palliative care: Secondary | ICD-10-CM | POA: Diagnosis not present

## 2019-07-12 DIAGNOSIS — I469 Cardiac arrest, cause unspecified: Secondary | ICD-10-CM | POA: Diagnosis present

## 2019-07-12 DIAGNOSIS — M6282 Rhabdomyolysis: Secondary | ICD-10-CM | POA: Diagnosis present

## 2019-07-12 DIAGNOSIS — S022XXA Fracture of nasal bones, initial encounter for closed fracture: Secondary | ICD-10-CM | POA: Diagnosis present

## 2019-07-12 DIAGNOSIS — Z8674 Personal history of sudden cardiac arrest: Secondary | ICD-10-CM | POA: Diagnosis not present

## 2019-07-12 DIAGNOSIS — G931 Anoxic brain damage, not elsewhere classified: Secondary | ICD-10-CM | POA: Diagnosis present

## 2019-07-12 DIAGNOSIS — G911 Obstructive hydrocephalus: Secondary | ICD-10-CM | POA: Diagnosis not present

## 2019-07-12 DIAGNOSIS — I959 Hypotension, unspecified: Secondary | ICD-10-CM | POA: Diagnosis present

## 2019-07-12 DIAGNOSIS — J9600 Acute respiratory failure, unspecified whether with hypoxia or hypercapnia: Secondary | ICD-10-CM

## 2019-07-12 DIAGNOSIS — Z0441 Encounter for examination and observation following alleged adult rape: Secondary | ICD-10-CM | POA: Insufficient documentation

## 2019-07-12 DIAGNOSIS — Z978 Presence of other specified devices: Secondary | ICD-10-CM

## 2019-07-12 DIAGNOSIS — S0083XA Contusion of other part of head, initial encounter: Secondary | ICD-10-CM | POA: Diagnosis present

## 2019-07-12 DIAGNOSIS — E875 Hyperkalemia: Secondary | ICD-10-CM | POA: Diagnosis present

## 2019-07-12 DIAGNOSIS — X58XXXA Exposure to other specified factors, initial encounter: Secondary | ICD-10-CM | POA: Diagnosis present

## 2019-07-12 DIAGNOSIS — I2510 Atherosclerotic heart disease of native coronary artery without angina pectoris: Secondary | ICD-10-CM | POA: Diagnosis present

## 2019-07-12 DIAGNOSIS — Z66 Do not resuscitate: Secondary | ICD-10-CM | POA: Diagnosis not present

## 2019-07-12 DIAGNOSIS — I6389 Other cerebral infarction: Secondary | ICD-10-CM

## 2019-07-12 DIAGNOSIS — G936 Cerebral edema: Secondary | ICD-10-CM | POA: Diagnosis present

## 2019-07-12 DIAGNOSIS — S7001XA Contusion of right hip, initial encounter: Secondary | ICD-10-CM

## 2019-07-12 DIAGNOSIS — N179 Acute kidney failure, unspecified: Secondary | ICD-10-CM | POA: Diagnosis present

## 2019-07-12 DIAGNOSIS — S1093XA Contusion of unspecified part of neck, initial encounter: Secondary | ICD-10-CM | POA: Diagnosis present

## 2019-07-12 DIAGNOSIS — K7201 Acute and subacute hepatic failure with coma: Secondary | ICD-10-CM | POA: Diagnosis not present

## 2019-07-12 DIAGNOSIS — E872 Acidosis: Secondary | ICD-10-CM | POA: Diagnosis present

## 2019-07-12 DIAGNOSIS — S7011XA Contusion of right thigh, initial encounter: Secondary | ICD-10-CM | POA: Diagnosis present

## 2019-07-12 DIAGNOSIS — Z20822 Contact with and (suspected) exposure to covid-19: Secondary | ICD-10-CM | POA: Diagnosis present

## 2019-07-12 DIAGNOSIS — G935 Compression of brain: Secondary | ICD-10-CM | POA: Diagnosis not present

## 2019-07-12 DIAGNOSIS — R34 Anuria and oliguria: Secondary | ICD-10-CM | POA: Diagnosis present

## 2019-07-12 DIAGNOSIS — I252 Old myocardial infarction: Secondary | ICD-10-CM

## 2019-07-12 DIAGNOSIS — S8011XA Contusion of right lower leg, initial encounter: Secondary | ICD-10-CM

## 2019-07-12 DIAGNOSIS — I6381 Other cerebral infarction due to occlusion or stenosis of small artery: Secondary | ICD-10-CM | POA: Diagnosis present

## 2019-07-12 DIAGNOSIS — F1410 Cocaine abuse, uncomplicated: Secondary | ICD-10-CM | POA: Diagnosis present

## 2019-07-12 DIAGNOSIS — Z880 Allergy status to penicillin: Secondary | ICD-10-CM

## 2019-07-12 DIAGNOSIS — R680 Hypothermia, not associated with low environmental temperature: Secondary | ICD-10-CM | POA: Diagnosis present

## 2019-07-12 DIAGNOSIS — R001 Bradycardia, unspecified: Secondary | ICD-10-CM | POA: Diagnosis present

## 2019-07-12 DIAGNOSIS — S20211A Contusion of right front wall of thorax, initial encounter: Secondary | ICD-10-CM | POA: Diagnosis present

## 2019-07-12 HISTORY — DX: ST elevation (STEMI) myocardial infarction of unspecified site: I21.3

## 2019-07-12 HISTORY — DX: Acute ischemic heart disease, unspecified: I24.9

## 2019-07-12 LAB — COMPREHENSIVE METABOLIC PANEL
ALT: 687 U/L — ABNORMAL HIGH (ref 0–44)
ALT: 908 U/L — ABNORMAL HIGH (ref 0–44)
AST: 838 U/L — ABNORMAL HIGH (ref 15–41)
AST: 1353 U/L — ABNORMAL HIGH (ref 15–41)
Albumin: 2.5 g/dL — ABNORMAL LOW (ref 3.5–5.0)
Albumin: 2.4 g/dL — ABNORMAL LOW (ref 3.5–5.0)
Alkaline Phosphatase: 110 U/L (ref 38–126)
Alkaline Phosphatase: 98 U/L (ref 38–126)
Anion gap: 21 — ABNORMAL HIGH (ref 5–15)
Anion gap: 14 (ref 5–15)
BUN: 30 mg/dL — ABNORMAL HIGH (ref 6–20)
BUN: 30 mg/dL — ABNORMAL HIGH (ref 6–20)
CO2: 17 mmol/L — ABNORMAL LOW (ref 22–32)
CO2: 22 mmol/L (ref 22–32)
Calcium: 5.8 mg/dL — CL (ref 8.9–10.3)
Calcium: 5.4 mg/dL — CL (ref 8.9–10.3)
Chloride: 106 mmol/L (ref 98–111)
Chloride: 106 mmol/L (ref 98–111)
Creatinine, Ser: 3.35 mg/dL — ABNORMAL HIGH (ref 0.44–1.00)
Creatinine, Ser: 2.37 mg/dL — ABNORMAL HIGH (ref 0.44–1.00)
GFR calc Af Amer: 20 mL/min — ABNORMAL LOW (ref >60)
GFR calc Af Amer: 31 mL/min — ABNORMAL LOW (ref >60)
GFR calc non Af Amer: 18 mL/min — ABNORMAL LOW (ref >60)
GFR calc non Af Amer: 27 mL/min — ABNORMAL LOW (ref >60)
Glucose, Bld: 113 mg/dL — ABNORMAL HIGH (ref 70–99)
Glucose, Bld: 122 mg/dL — ABNORMAL HIGH (ref 70–99)
Potassium: 6.7 mmol/L (ref 3.5–5.1)
Potassium: 6 mmol/L — ABNORMAL HIGH (ref 3.5–5.1)
Sodium: 144 mmol/L (ref 135–145)
Sodium: 142 mmol/L (ref 135–145)
Total Bilirubin: 0.8 mg/dL (ref 0.3–1.2)
Total Bilirubin: 1.3 mg/dL — ABNORMAL HIGH (ref 0.3–1.2)
Total Protein: 4.8 g/dL — ABNORMAL LOW (ref 6.5–8.1)
Total Protein: 4.8 g/dL — ABNORMAL LOW (ref 6.5–8.1)

## 2019-07-12 LAB — BASIC METABOLIC PANEL
Anion gap: 12 (ref 5–15)
Anion gap: 11 (ref 5–15)
BUN: 31 mg/dL — ABNORMAL HIGH (ref 6–20)
BUN: 32 mg/dL — ABNORMAL HIGH (ref 6–20)
CO2: 26 mmol/L (ref 22–32)
CO2: 27 mmol/L (ref 22–32)
Calcium: 6.6 mg/dL — ABNORMAL LOW (ref 8.9–10.3)
Calcium: 6.2 mg/dL — CL (ref 8.9–10.3)
Chloride: 106 mmol/L (ref 98–111)
Chloride: 107 mmol/L (ref 98–111)
Creatinine, Ser: 2.6 mg/dL — ABNORMAL HIGH (ref 0.44–1.00)
Creatinine, Ser: 2.79 mg/dL — ABNORMAL HIGH (ref 0.44–1.00)
GFR calc Af Amer: 28 mL/min — ABNORMAL LOW (ref >60)
GFR calc Af Amer: 25 mL/min — ABNORMAL LOW (ref >60)
GFR calc non Af Amer: 24 mL/min — ABNORMAL LOW (ref >60)
GFR calc non Af Amer: 22 mL/min — ABNORMAL LOW (ref >60)
Glucose, Bld: 123 mg/dL — ABNORMAL HIGH (ref 70–99)
Glucose, Bld: 147 mg/dL — ABNORMAL HIGH (ref 70–99)
Potassium: 3.9 mmol/L (ref 3.5–5.1)
Potassium: 3.7 mmol/L (ref 3.5–5.1)
Sodium: 144 mmol/L (ref 135–145)
Sodium: 145 mmol/L (ref 135–145)

## 2019-07-12 LAB — RAPID URINE DRUG SCREEN, HOSP PERFORMED
Amphetamines: NOT DETECTED
Barbiturates: NOT DETECTED
Benzodiazepines: NOT DETECTED
Cocaine: POSITIVE — AB
Opiates: NOT DETECTED
Tetrahydrocannabinol: POSITIVE — AB

## 2019-07-12 LAB — POCT I-STAT 7, (LYTES, BLD GAS, ICA,H+H)
Acid-base deficit: 19 mmol/L — ABNORMAL HIGH (ref 0.0–2.0)
Acid-base deficit: 15 mmol/L — ABNORMAL HIGH (ref 0.0–2.0)
Acid-base deficit: 5 mmol/L — ABNORMAL HIGH (ref 0.0–2.0)
Bicarbonate: 14.8 mmol/L — ABNORMAL LOW (ref 20.0–28.0)
Bicarbonate: 17.5 mmol/L — ABNORMAL LOW (ref 20.0–28.0)
Bicarbonate: 21.5 mmol/L (ref 20.0–28.0)
Calcium, Ion: 0.92 mmol/L — ABNORMAL LOW (ref 1.15–1.40)
Calcium, Ion: 0.82 mmol/L — CL (ref 1.15–1.40)
Calcium, Ion: 0.78 mmol/L — CL (ref 1.15–1.40)
HCT: 50 % — ABNORMAL HIGH (ref 36.0–46.0)
HCT: 37 % (ref 36.0–46.0)
HCT: 44 % (ref 36.0–46.0)
Hemoglobin: 17 g/dL — ABNORMAL HIGH (ref 12.0–15.0)
Hemoglobin: 12.6 g/dL (ref 12.0–15.0)
Hemoglobin: 15 g/dL (ref 12.0–15.0)
O2 Saturation: 100 %
O2 Saturation: 90 %
O2 Saturation: 96 %
Patient temperature: 84.1
Patient temperature: 90
Potassium: 7.1 mmol/L (ref 3.5–5.1)
Potassium: 6.2 mmol/L — ABNORMAL HIGH (ref 3.5–5.1)
Potassium: 6.3 mmol/L (ref 3.5–5.1)
Sodium: 133 mmol/L — ABNORMAL LOW (ref 135–145)
Sodium: 143 mmol/L (ref 135–145)
Sodium: 141 mmol/L (ref 135–145)
TCO2: 17 mmol/L — ABNORMAL LOW (ref 22–32)
TCO2: 20 mmol/L — ABNORMAL LOW (ref 22–32)
TCO2: 23 mmol/L (ref 22–32)
pCO2 arterial: 72.7 mmHg (ref 32.0–48.0)
pCO2 arterial: 51.5 mmHg — ABNORMAL HIGH (ref 32.0–48.0)
pCO2 arterial: 34.5 mmHg (ref 32.0–48.0)
pH, Arterial: 6.916 — CL (ref 7.350–7.450)
pH, Arterial: 7.081 — CL (ref 7.350–7.450)
pH, Arterial: 7.379 (ref 7.350–7.450)
pO2, Arterial: 566 mmHg — ABNORMAL HIGH (ref 83.0–108.0)
pO2, Arterial: 55 mmHg — ABNORMAL LOW (ref 83.0–108.0)
pO2, Arterial: 63 mmHg — ABNORMAL LOW (ref 83.0–108.0)

## 2019-07-12 LAB — PROTIME-INR
INR: 1.2 (ref 0.8–1.2)
Prothrombin Time: 15.4 seconds — ABNORMAL HIGH (ref 11.4–15.2)

## 2019-07-12 LAB — HIV ANTIBODY (ROUTINE TESTING W REFLEX): HIV Screen 4th Generation wRfx: NONREACTIVE

## 2019-07-12 LAB — CBC WITH DIFFERENTIAL/PLATELET
Abs Immature Granulocytes: 0.68 10*3/uL — ABNORMAL HIGH (ref 0.00–0.07)
Basophils Absolute: 0.1 10*3/uL (ref 0.0–0.1)
Basophils Relative: 1 %
Eosinophils Absolute: 0 10*3/uL (ref 0.0–0.5)
Eosinophils Relative: 0 %
HCT: 44.7 % (ref 36.0–46.0)
Hemoglobin: 13.1 g/dL (ref 12.0–15.0)
Immature Granulocytes: 3 %
Lymphocytes Relative: 8 %
Lymphs Abs: 2.2 10*3/uL (ref 0.7–4.0)
MCH: 29.8 pg (ref 26.0–34.0)
MCHC: 29.3 g/dL — ABNORMAL LOW (ref 30.0–36.0)
MCV: 101.8 fL — ABNORMAL HIGH (ref 80.0–100.0)
Monocytes Absolute: 0.3 10*3/uL (ref 0.1–1.0)
Monocytes Relative: 1 %
Neutro Abs: 23 10*3/uL — ABNORMAL HIGH (ref 1.7–7.7)
Neutrophils Relative %: 87 %
Platelets: 377 10*3/uL (ref 150–400)
RBC: 4.39 MIL/uL (ref 3.87–5.11)
RDW: 14.5 % (ref 11.5–15.5)
WBC: 26.3 10*3/uL — ABNORMAL HIGH (ref 4.0–10.5)
nRBC: 0.4 % — ABNORMAL HIGH (ref 0.0–0.2)

## 2019-07-12 LAB — TROPONIN I (HIGH SENSITIVITY)
Troponin I (High Sensitivity): 279 ng/L (ref <18)
Troponin I (High Sensitivity): 272 ng/L (ref <18)
Troponin I (High Sensitivity): 457 ng/L (ref <18)

## 2019-07-12 LAB — I-STAT CHEM 8, ED
BUN: 37 mg/dL — ABNORMAL HIGH (ref 6–20)
Calcium, Ion: 0.79 mmol/L — CL (ref 1.15–1.40)
Chloride: 112 mmol/L — ABNORMAL HIGH (ref 98–111)
Creatinine, Ser: 2.8 mg/dL — ABNORMAL HIGH (ref 0.44–1.00)
Glucose, Bld: 141 mg/dL — ABNORMAL HIGH (ref 70–99)
HCT: 40 % (ref 36.0–46.0)
Hemoglobin: 13.6 g/dL (ref 12.0–15.0)
Potassium: 5.1 mmol/L (ref 3.5–5.1)
Sodium: 140 mmol/L (ref 135–145)
TCO2: 15 mmol/L — ABNORMAL LOW (ref 22–32)

## 2019-07-12 LAB — LACTIC ACID, PLASMA
Lactic Acid, Venous: 9.7 mmol/L (ref 0.5–1.9)
Lactic Acid, Venous: 8.7 mmol/L (ref 0.5–1.9)
Lactic Acid, Venous: 3.9 mmol/L (ref 0.5–1.9)
Lactic Acid, Venous: 5.1 mmol/L (ref 0.5–1.9)

## 2019-07-12 LAB — AMMONIA: Ammonia: 215 umol/L — ABNORMAL HIGH (ref 9–35)

## 2019-07-12 LAB — GLUCOSE, CAPILLARY
Glucose-Capillary: 108 mg/dL — ABNORMAL HIGH (ref 70–99)
Glucose-Capillary: 116 mg/dL — ABNORMAL HIGH (ref 70–99)
Glucose-Capillary: 121 mg/dL — ABNORMAL HIGH (ref 70–99)
Glucose-Capillary: 111 mg/dL — ABNORMAL HIGH (ref 70–99)

## 2019-07-12 LAB — SALICYLATE LEVEL: Salicylate Lvl: <7 mg/dL — ABNORMAL LOW (ref 7.0–30.0)

## 2019-07-12 LAB — MAGNESIUM
Magnesium: 2.7 mg/dL — ABNORMAL HIGH (ref 1.7–2.4)
Magnesium: 1.9 mg/dL (ref 1.7–2.4)

## 2019-07-12 LAB — I-STAT BETA HCG BLOOD, ED (MC, WL, AP ONLY): I-stat hCG, quantitative: 6.7 m[IU]/mL — ABNORMAL HIGH (ref <5)

## 2019-07-12 LAB — CBC
HCT: 46.1 % — ABNORMAL HIGH (ref 36.0–46.0)
Hemoglobin: 14.1 g/dL (ref 12.0–15.0)
MCH: 29.7 pg (ref 26.0–34.0)
MCHC: 30.6 g/dL (ref 30.0–36.0)
MCV: 97.3 fL (ref 80.0–100.0)
Platelets: 364 10*3/uL (ref 150–400)
RBC: 4.74 MIL/uL (ref 3.87–5.11)
RDW: 14.3 % (ref 11.5–15.5)
WBC: 23.6 10*3/uL — ABNORMAL HIGH (ref 4.0–10.5)
nRBC: 0.3 % — ABNORMAL HIGH (ref 0.0–0.2)

## 2019-07-12 LAB — CREATININE, SERUM
Creatinine, Ser: 2.93 mg/dL — ABNORMAL HIGH (ref 0.44–1.00)
GFR calc Af Amer: 24 mL/min — ABNORMAL LOW (ref >60)
GFR calc non Af Amer: 21 mL/min — ABNORMAL LOW (ref >60)

## 2019-07-12 LAB — RESPIRATORY PANEL BY RT PCR (FLU A&B, COVID)
Influenza A by PCR: NEGATIVE
Influenza B by PCR: NEGATIVE
SARS Coronavirus 2 by RT PCR: NEGATIVE

## 2019-07-12 LAB — APTT: aPTT: 28 seconds (ref 24–36)

## 2019-07-12 LAB — CK
Total CK: 35503 U/L — ABNORMAL HIGH (ref 38–234)
Total CK: >50000 U/L — ABNORMAL HIGH (ref 38–234)

## 2019-07-12 LAB — MRSA PCR SCREENING: MRSA by PCR: NEGATIVE

## 2019-07-12 LAB — HCG, QUANTITATIVE, PREGNANCY: hCG, Beta Chain, Quant, S: 1 m[IU]/mL (ref <5)

## 2019-07-12 LAB — ETHANOL: Alcohol, Ethyl (B): <10 mg/dL (ref <10)

## 2019-07-12 LAB — ACETAMINOPHEN LEVEL: Acetaminophen (Tylenol), Serum: <10 ug/mL — ABNORMAL LOW (ref 10–30)

## 2019-07-12 LAB — LIPASE, BLOOD: Lipase: 90 U/L — ABNORMAL HIGH (ref 11–51)

## 2019-07-12 LAB — PHOSPHORUS: Phosphorus: 5.3 mg/dL — ABNORMAL HIGH (ref 2.5–4.6)

## 2019-07-12 IMAGING — DX DG TIBIA/FIBULA 2V*R*
2 series · 2 of 2 positions shown · non-contrast
Comparison: None.

CLINICAL DATA: Unknown trauma. Ecchymosis of right lower extremity.

EXAM:
RIGHT TIBIA AND FIBULA - 2 VIEW

[tibia ap]
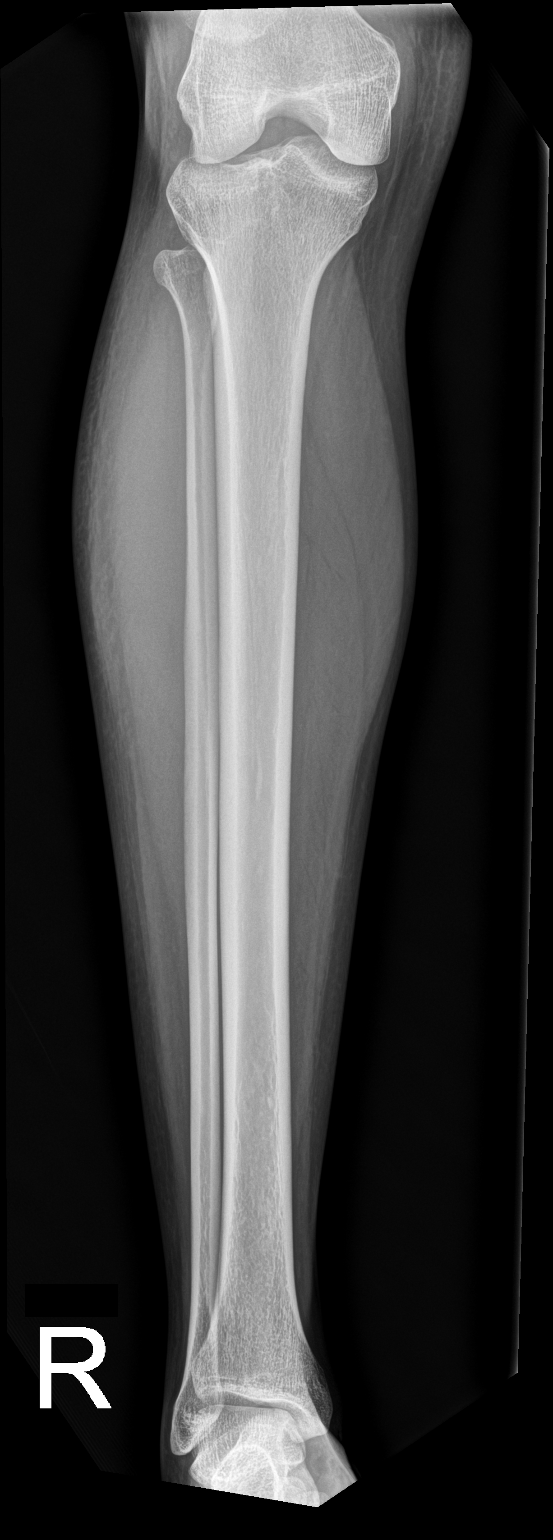

[tibia lat]
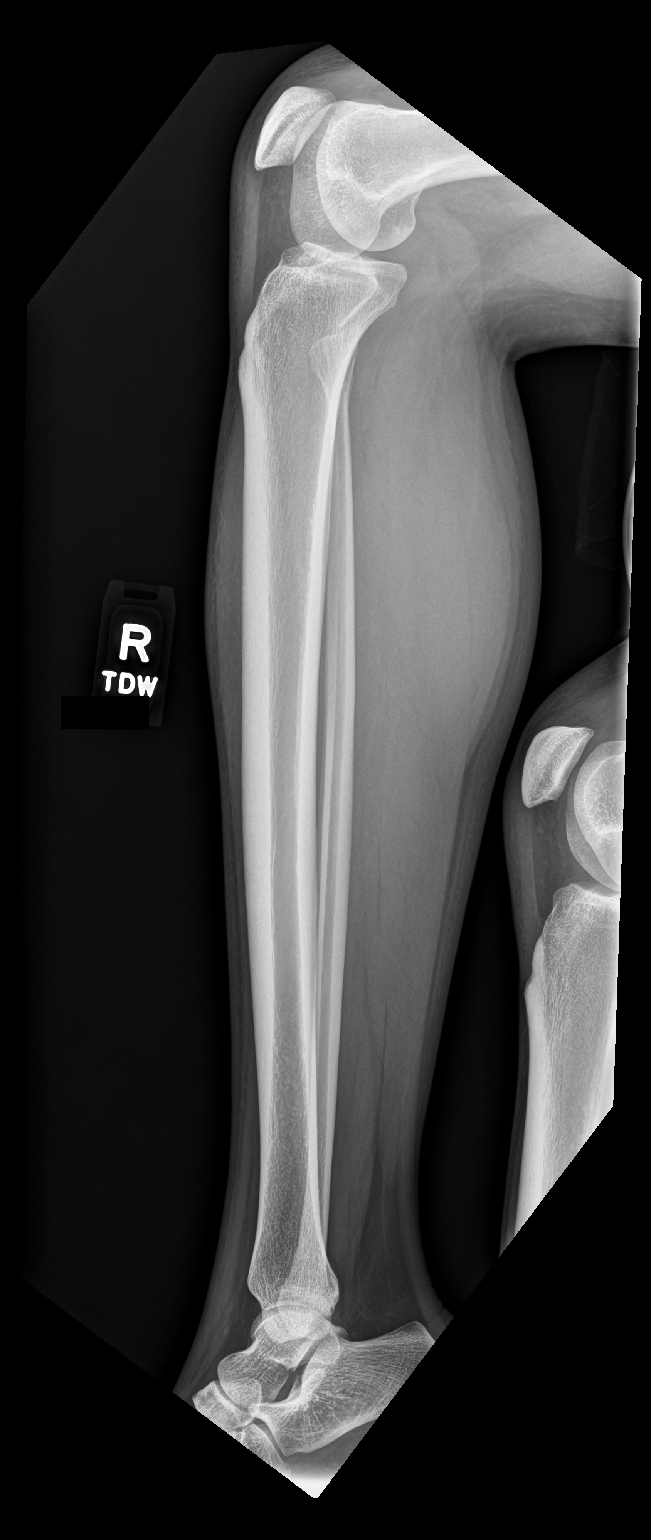

[2 of 2 positions shown; findings below may reference images not displayed]

FINDINGS: There is no evidence of fracture or other focal bone lesions. There
is mild nonspecific soft tissue swelling involving the right lower
extremity.
IMPRESSION: No acute displaced fracture. There is mild nonspecific lower
extremity edema.

## 2019-07-12 IMAGING — CT CT MAXILLOFACIAL W/O CM
3 of 6 series · 16 of 47 positions shown, 19 images · non-contrast
Comparison: None.

CLINICAL DATA: Facial trauma. Cardiac arrest. Found unresponsive.

EXAM:
CT MAXILLOFACIAL WITHOUT CONTRAST
TECHNIQUE: Multidetector CT imaging of the maxillofacial structures was
performed. Multiplanar CT image reconstructions were also generated.

[Series 3: maxilllofacial 2.0 hr40 3 · axial · 0.37mm/px · z∈[+936,+1084]mm · 10 of 88 slices shown, 13 images]
[im 7/88  brain]
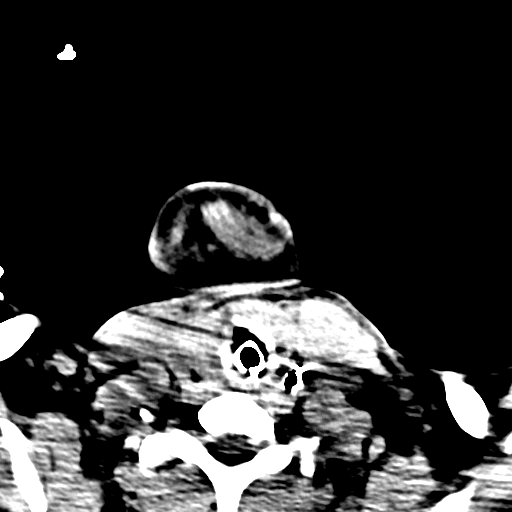
[im 7/88  bone]
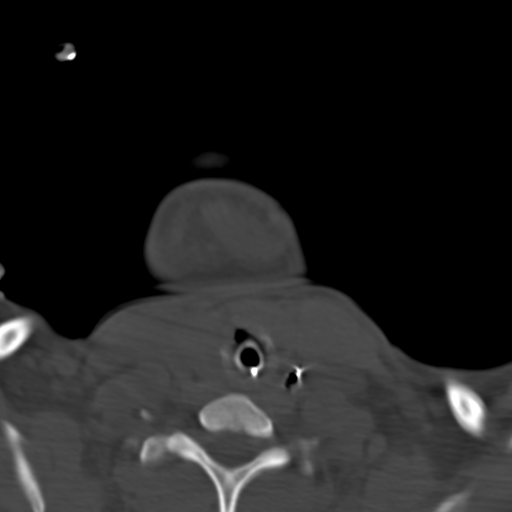
[im 13/88  bone]
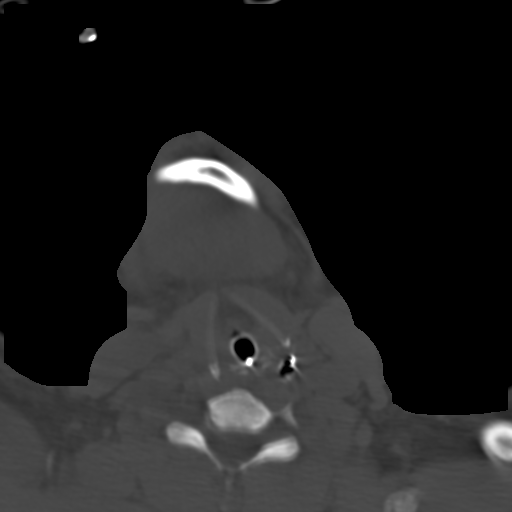
[im 25/88  bone]
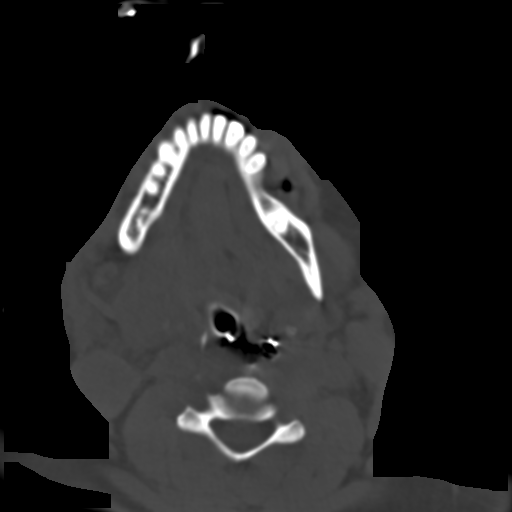
[im 32/88  bone]
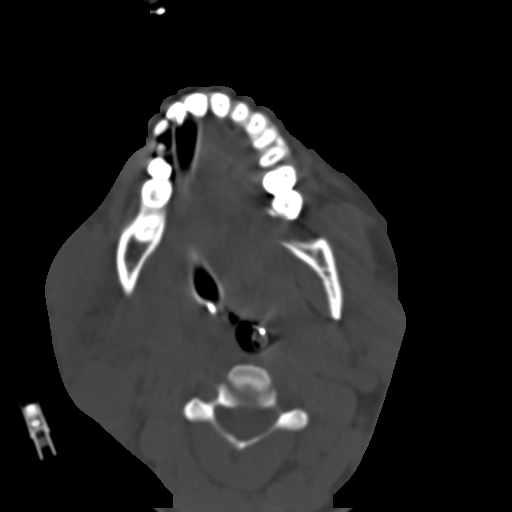
[im 38/88  brain]
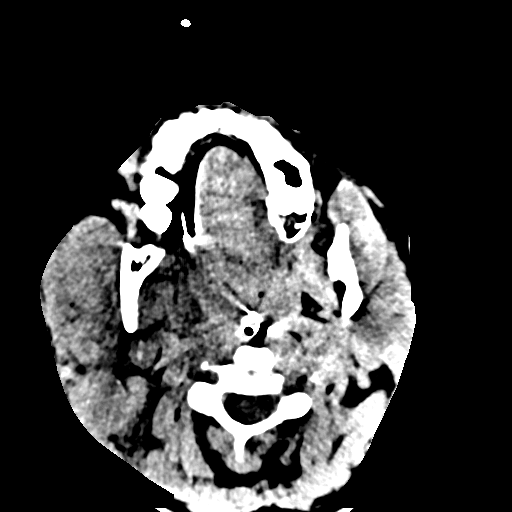
[im 38/88  bone]
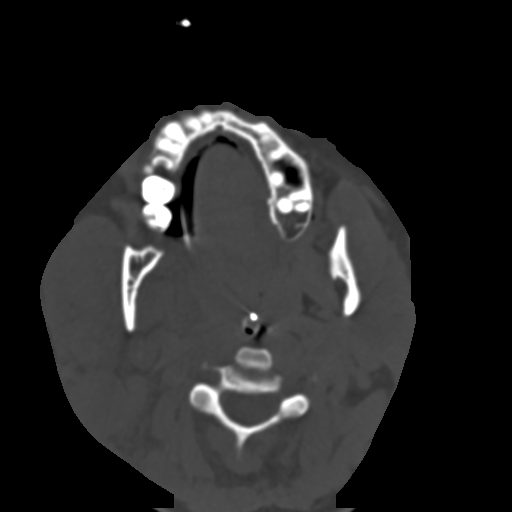
[im 50/88  bone]
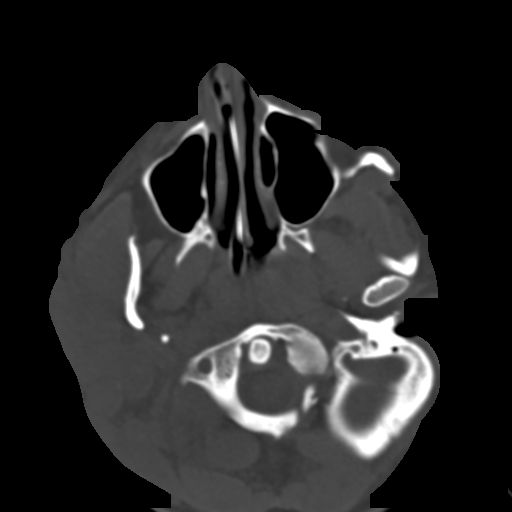
[im 56/88  bone]
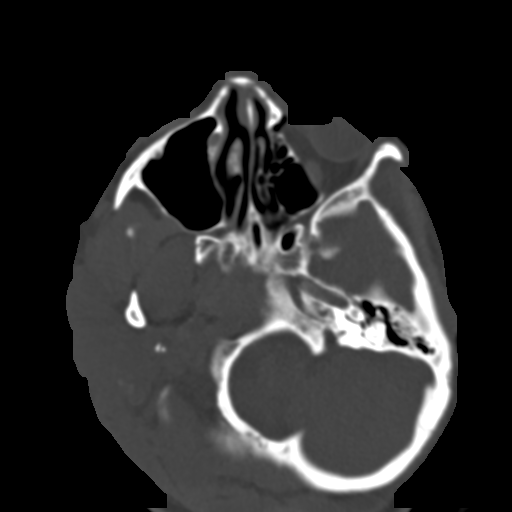
[im 63/88  bone]
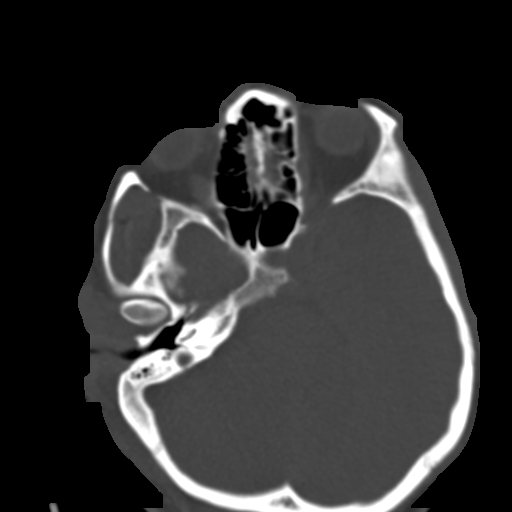
[im 75/88  brain]
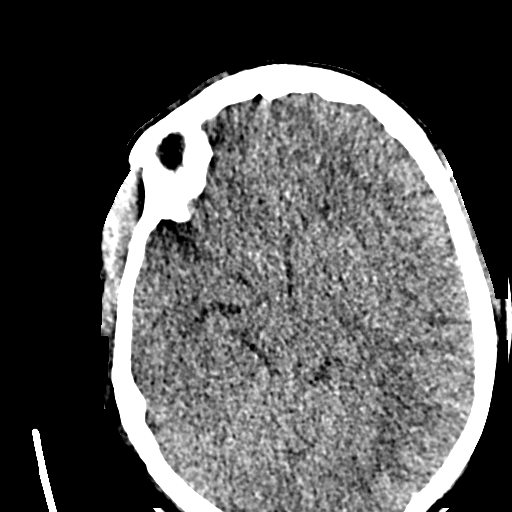
[im 75/88  bone]
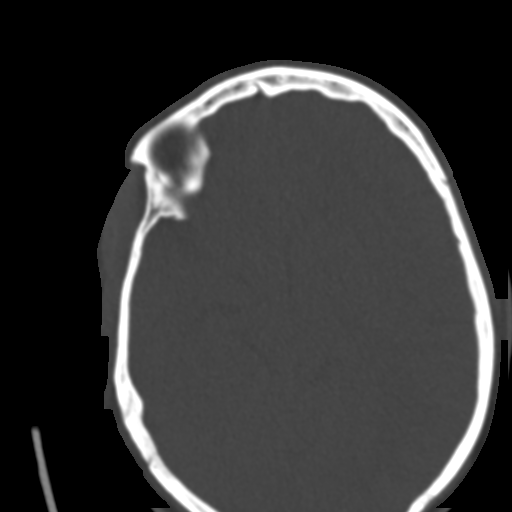
[im 81/88  bone]
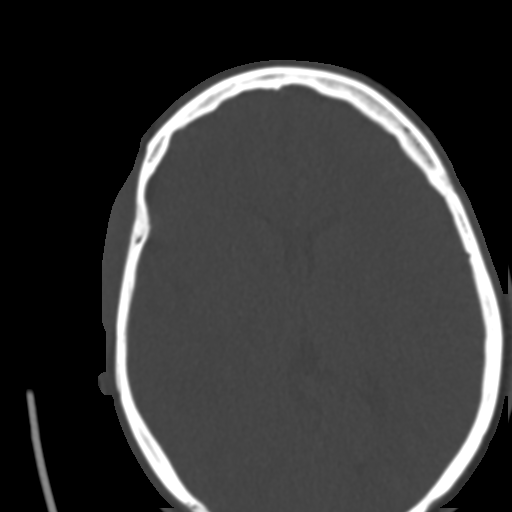

[Series 7: st cor · coronal · 0.33mm/px · 3 of 105 slices shown]
[im 27/105  bone]
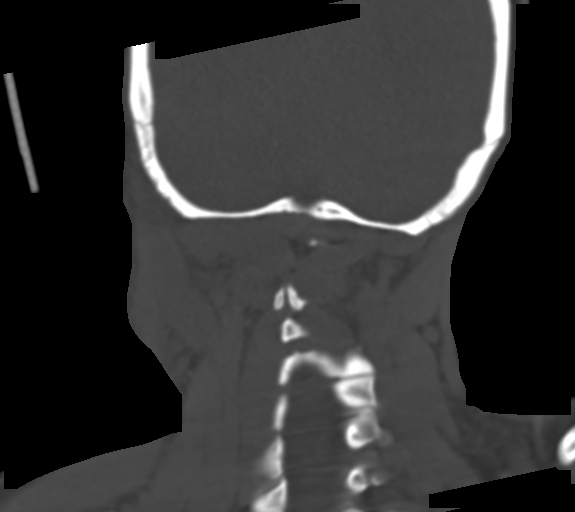
[im 53/105  bone]
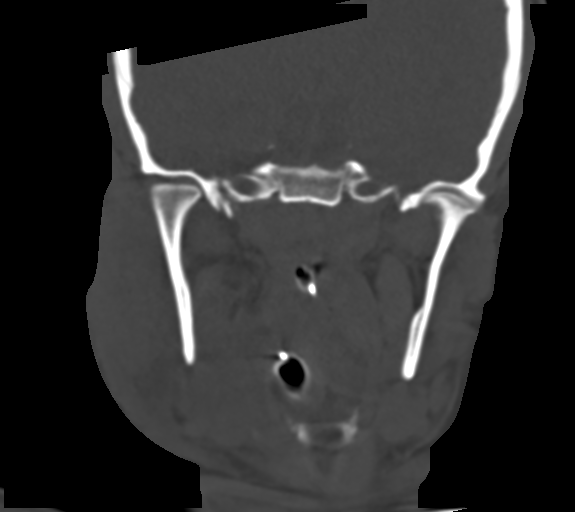
[im 79/105  bone]
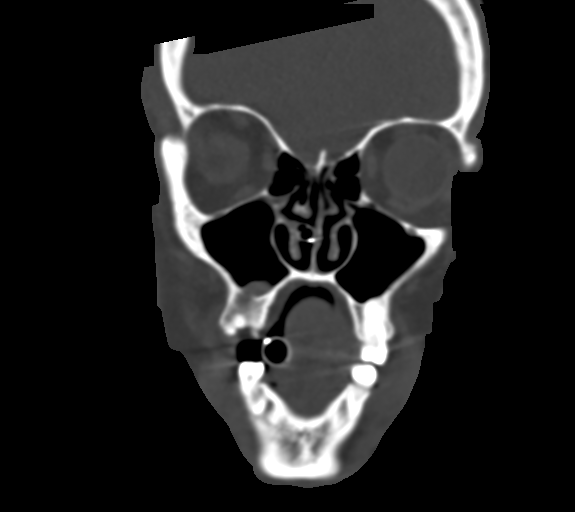

[Series 10: bone sag · sagittal · 0.38mm/px · 3 of 92 slices shown]
[im 10/92  bone]
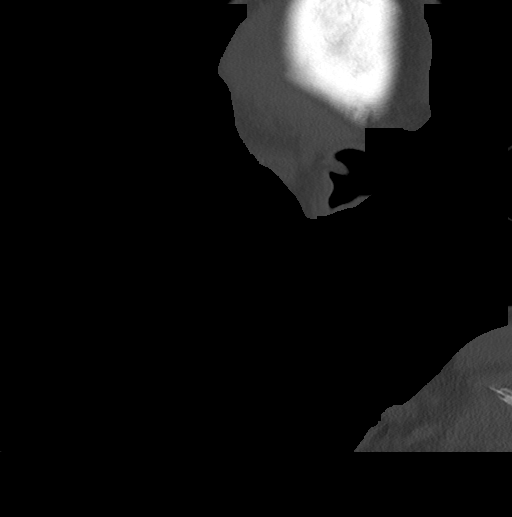
[im 23/92  bone]
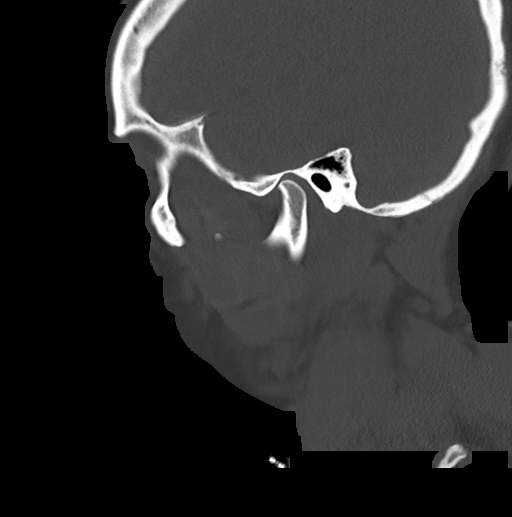
[im 37/92  bone]
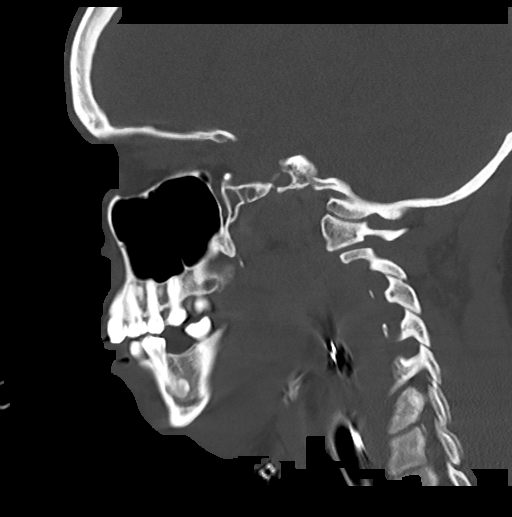

[16 of 47 positions shown; findings below may reference images not displayed]

FINDINGS: Osseous: Nondisplaced left nasal fracture, age indeterminate.
Nasogastric tube through the right near. Zygomatic arches and
mandibles are intact. Temporomandibular joints are congruent. There
are scattered missing teeth. Endotracheal tube in place.

Orbits: No acute orbital fracture. Both orbits and globes are
intact.

Sinuses: No sinus fracture or fluid level. Small mucous retention
cyst in the right maxillary sinus. Mastoid air cells are clear.

Soft tissues: Negative.

Limited intracranial: Assessed on concurrent head CT, reported
separately.
IMPRESSION: Nondisplaced left nasal fracture, age indeterminate. No other facial
bone fracture.

## 2019-07-12 IMAGING — CT CT HEAD W/O CM
5 of 6 series · 17 of 47 positions shown, 18 images · non-contrast
Comparison: None.

CLINICAL DATA: Found on ground. Cardiac arrest with unknown down
time. Found unresponsive in bathroom.

EXAM:
CT HEAD WITHOUT CONTRAST
TECHNIQUE: Contiguous axial images were obtained from the base of the skull
through the vertex without intravenous contrast.

[Series 3: head wo · axial · 0.45mm/px · z∈[+1046,+1130]mm · 3 of 35 slices shown, 4 images (1 of 2)]
[im 9/35  brain]
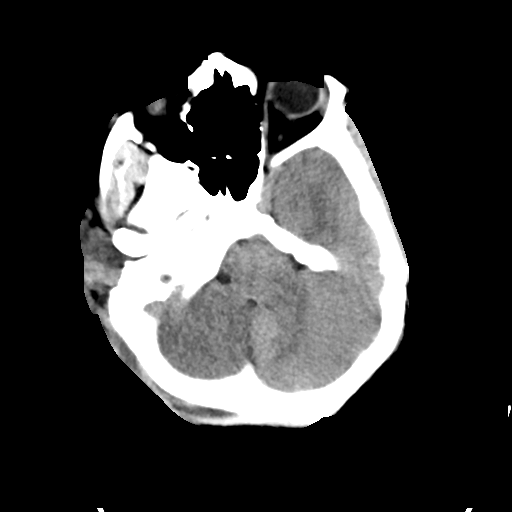
[im 9/35  bone]
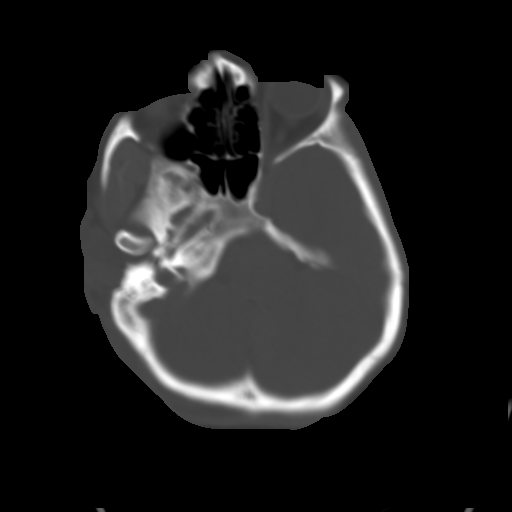
[im 18/35  brain]
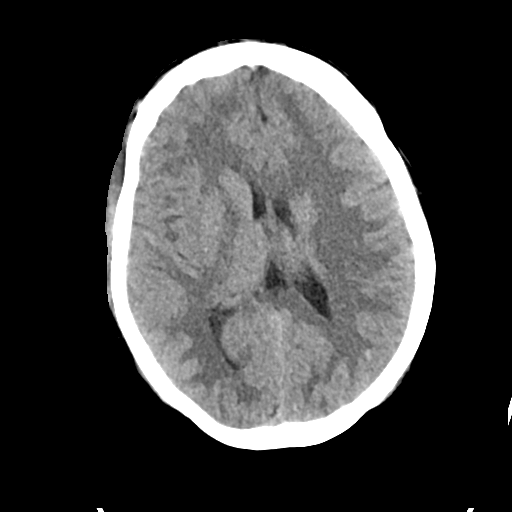
[im 26/35  brain]
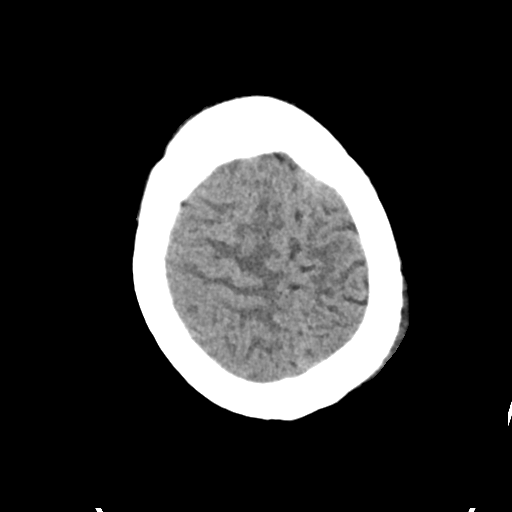

[Series 5: cor soft · coronal · 0.33mm/px · 3 of 68 slices shown]
[im 23/68  brain]
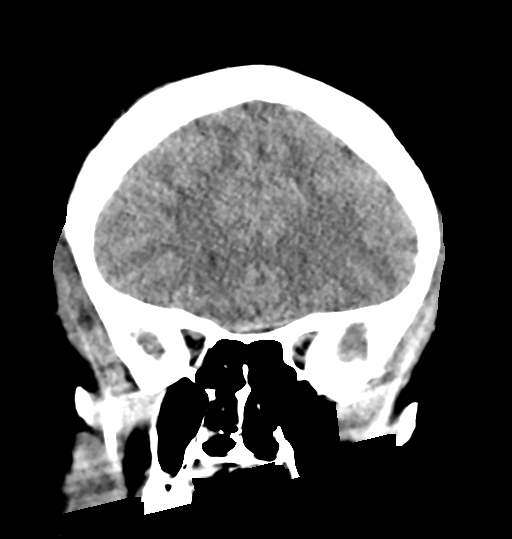
[im 30/68  brain]
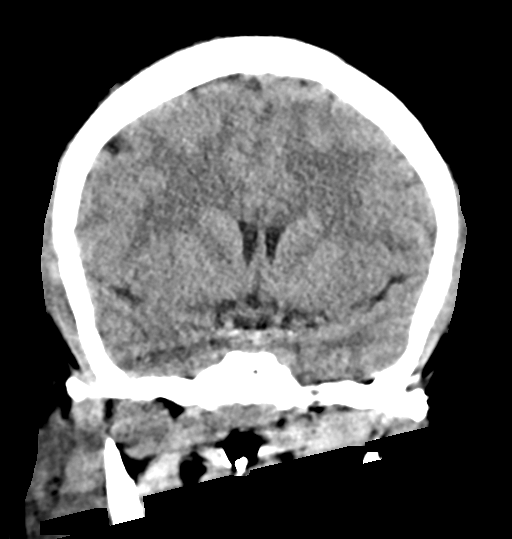
[im 38/68  brain]
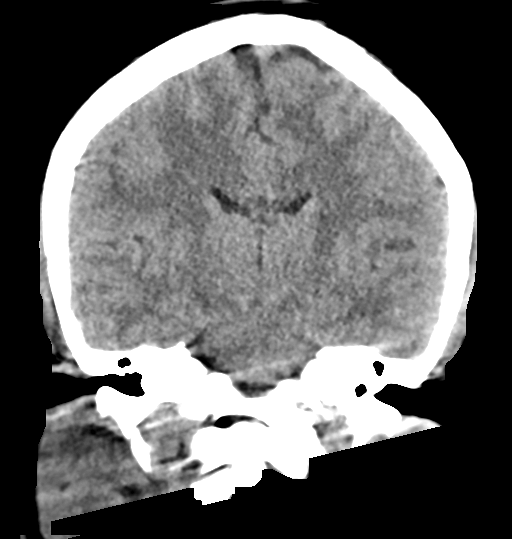

[Series 6: sag soft · sagittal · 0.35mm/px · 3 of 57 slices shown]
[im 24/57  brain]
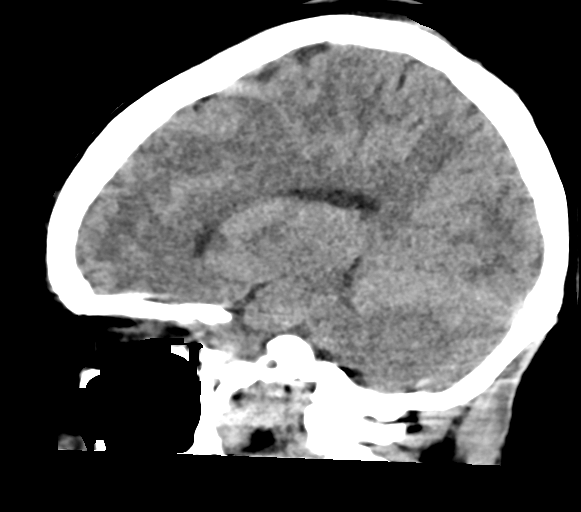
[im 29/57  brain]
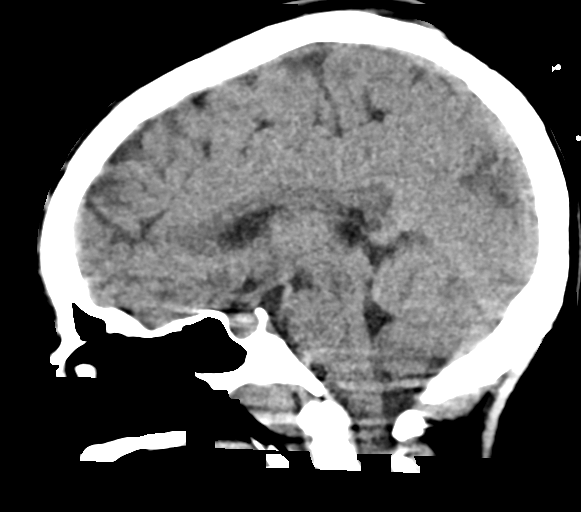
[im 33/57  brain]
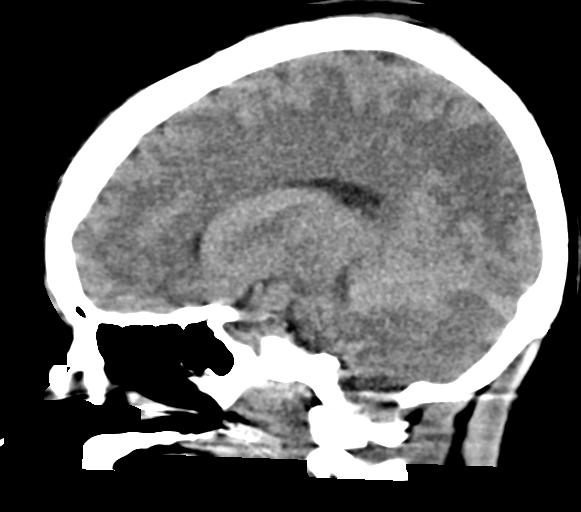

[Series 7: head wo · axial · 0.33mm/px · z∈[+1064,+1122]mm · 2 of 36 slices shown (2 of 2)]
[im 12/36  brain]
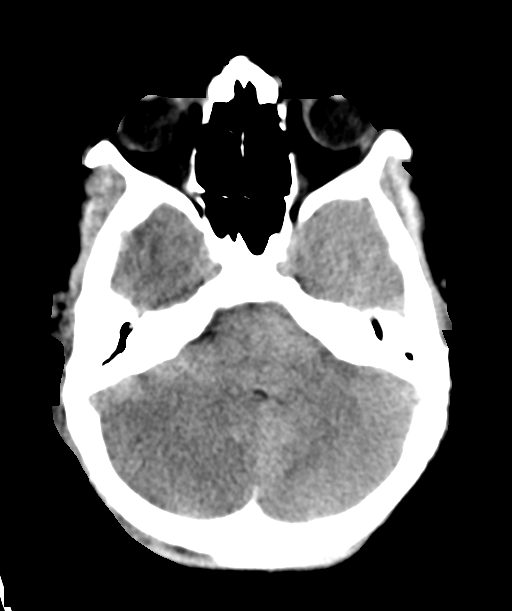
[im 24/36  brain]
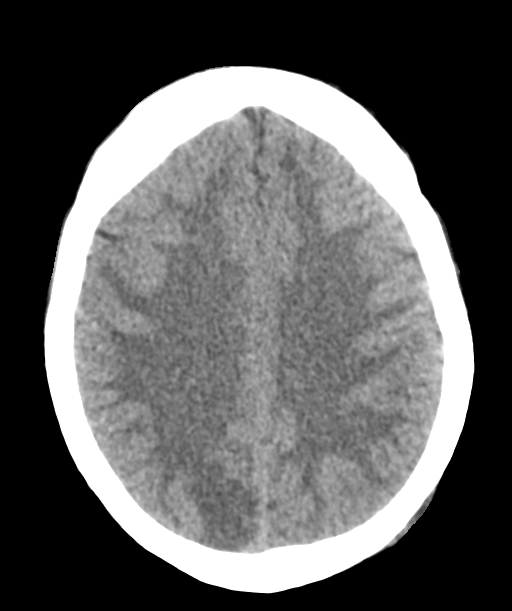

[Series 8: head bone · axial · 0.33mm/px · z∈[+1023,+1128]mm · 6 of 90 slices shown]
[im 9/90  bone]
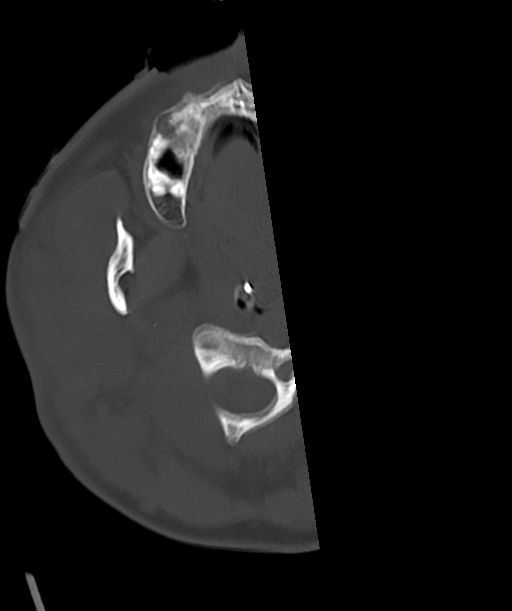
[im 18/90  bone]
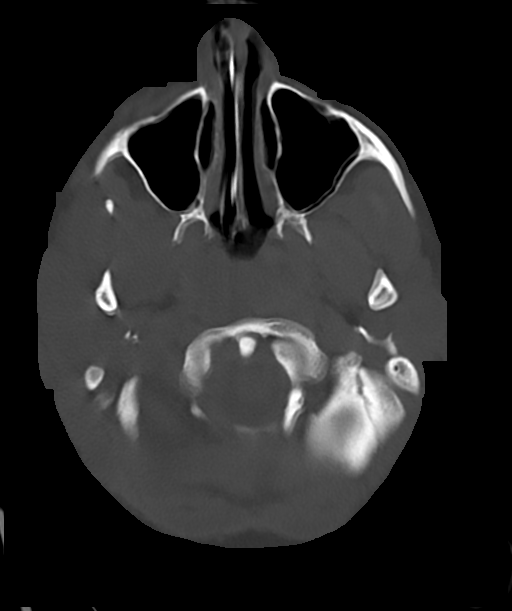
[im 27/90  bone]
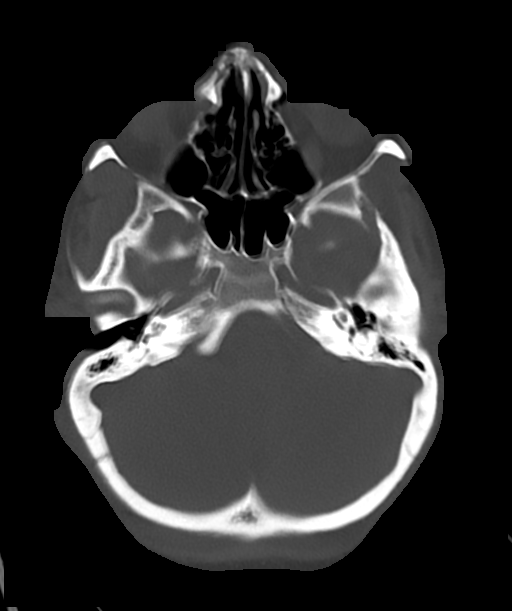
[im 36/90  bone]
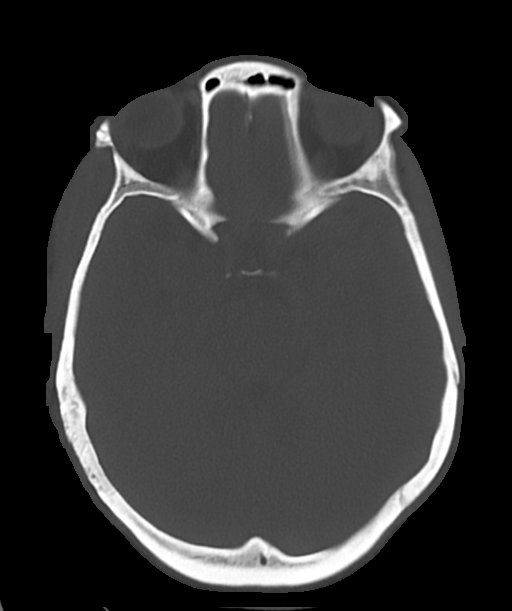
[im 54/90  bone]
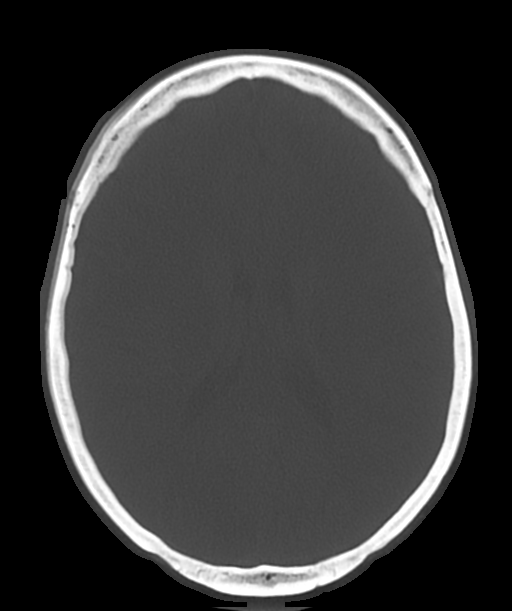
[im 63/90  bone]
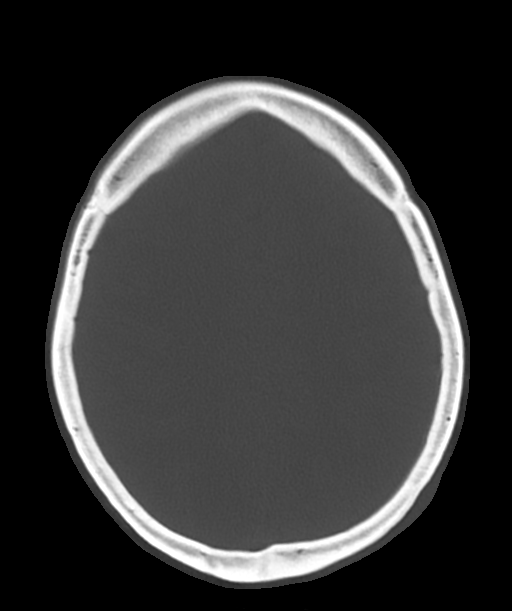

[17 of 47 positions shown; findings below may reference images not displayed]

FINDINGS: Brain: Motion artifact limitations. Loss of gray-white
differentiation and hypodensity involving the inferior right
cerebellum and right posterior parietooccipital lobes suspicious for
acute ischemia. Gray-white differentiation is otherwise preserved.
No definite sulcal effacement, the basilar cisterns are patent. No
hemorrhage. No subdural or extra-axial collection.

Vascular: No hyperdense vessel.

Skull: No fracture or focal lesion.

Sinuses/Orbits: Assessed on concurrent face CT, reported separately.

Other: Possible left parietal scalp hematoma.
IMPRESSION: Loss of gray-white differentiation and hypodensity involving the
inferior right cerebellum and right posterior parietooccipital lobes
suspicious for acute ischemia. Recommend brain MRI for further
evaluation and confirmation.

## 2019-07-12 IMAGING — DX DG PELVIS 1-2V
1 series · 1 of 1 positions shown · non-contrast
Comparison: None.

CLINICAL DATA: Unknown trauma.

EXAM:
PELVIS - 1-2 VIEW

[pelvis ap]
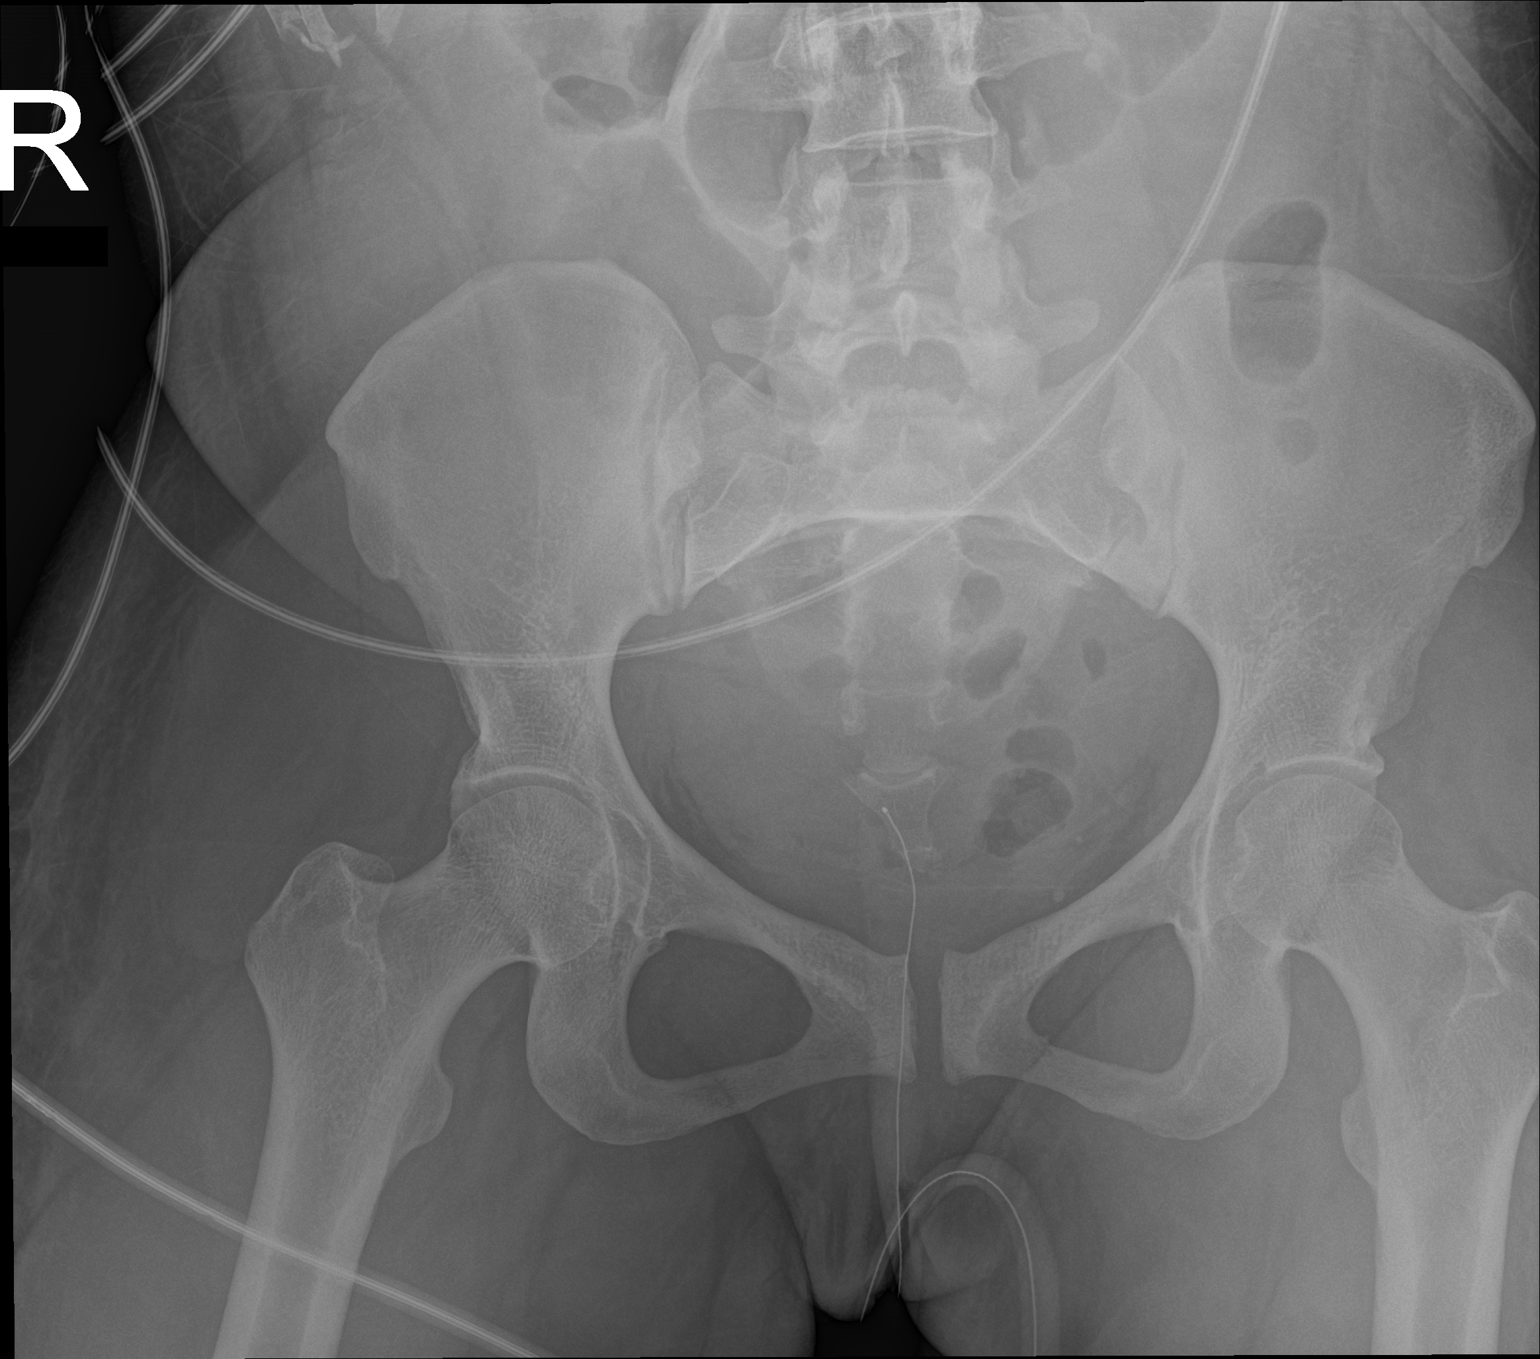

[1 of 1 positions shown; findings below may reference images not displayed]

FINDINGS: There is no evidence of pelvic fracture or diastasis. No pelvic bone
lesions are seen. A Foley catheter projects over the patient's
pelvis. There is nonspecific soft tissue swelling involving the
proximal right thigh.
IMPRESSION: 1. No acute displaced fracture.
2. Soft tissue swelling involving the partially visualized proximal
right thigh.

## 2019-07-12 IMAGING — DX DG FEMUR 2+V PORT*R*
4 series · 4 of 4 positions shown · non-contrast
Comparison: None.

CLINICAL DATA: Pain. Swelling. Unknown trauma.

EXAM:
RIGHT FEMUR PORTABLE 2 VIEW

[femur lat (1 of 2)]
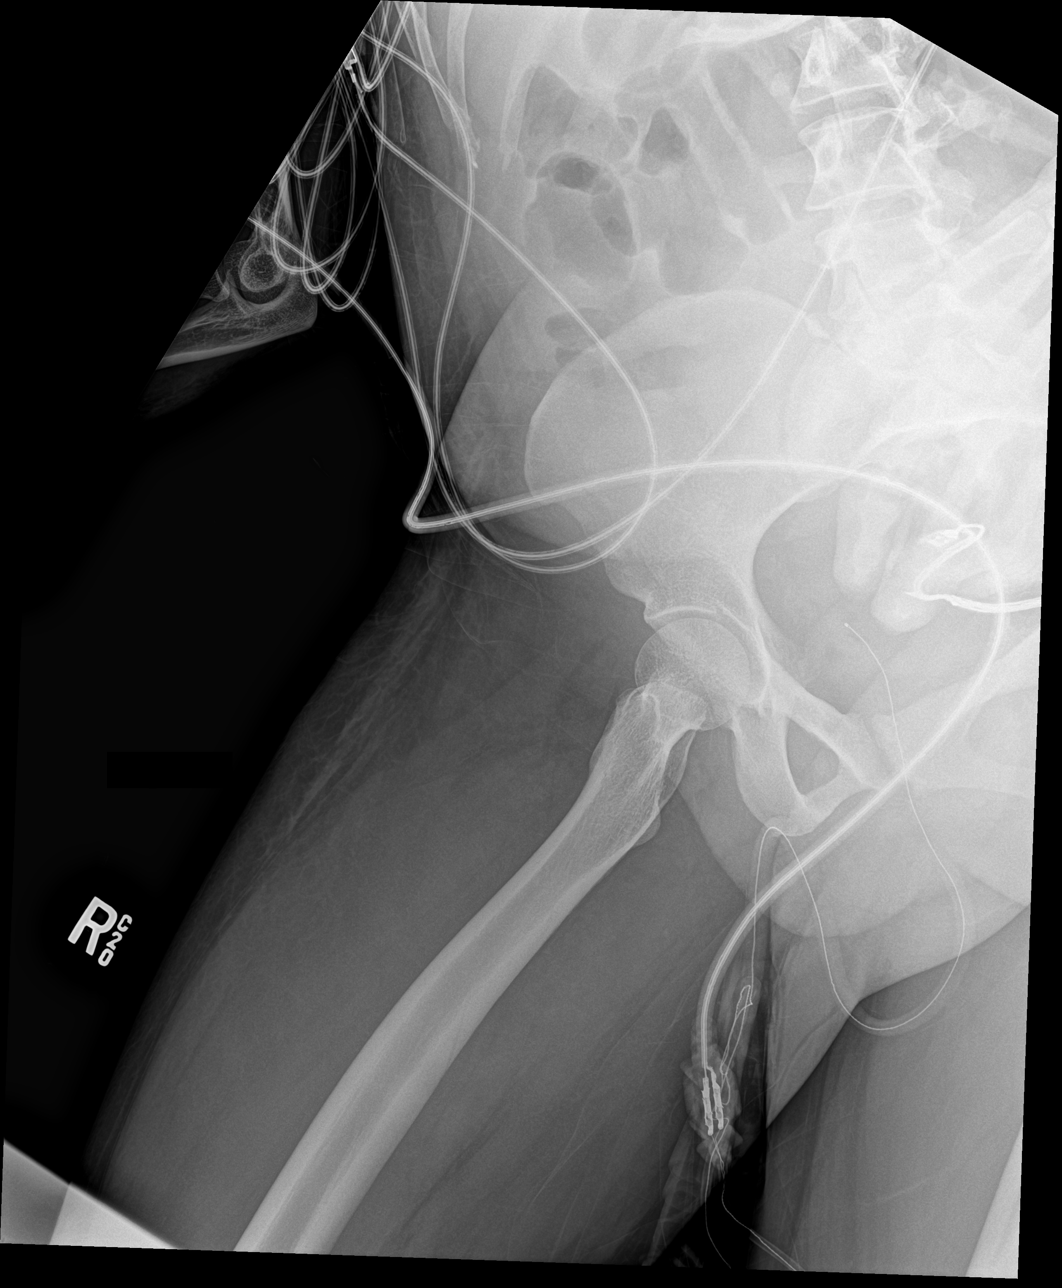

[femur lat (2 of 2)]
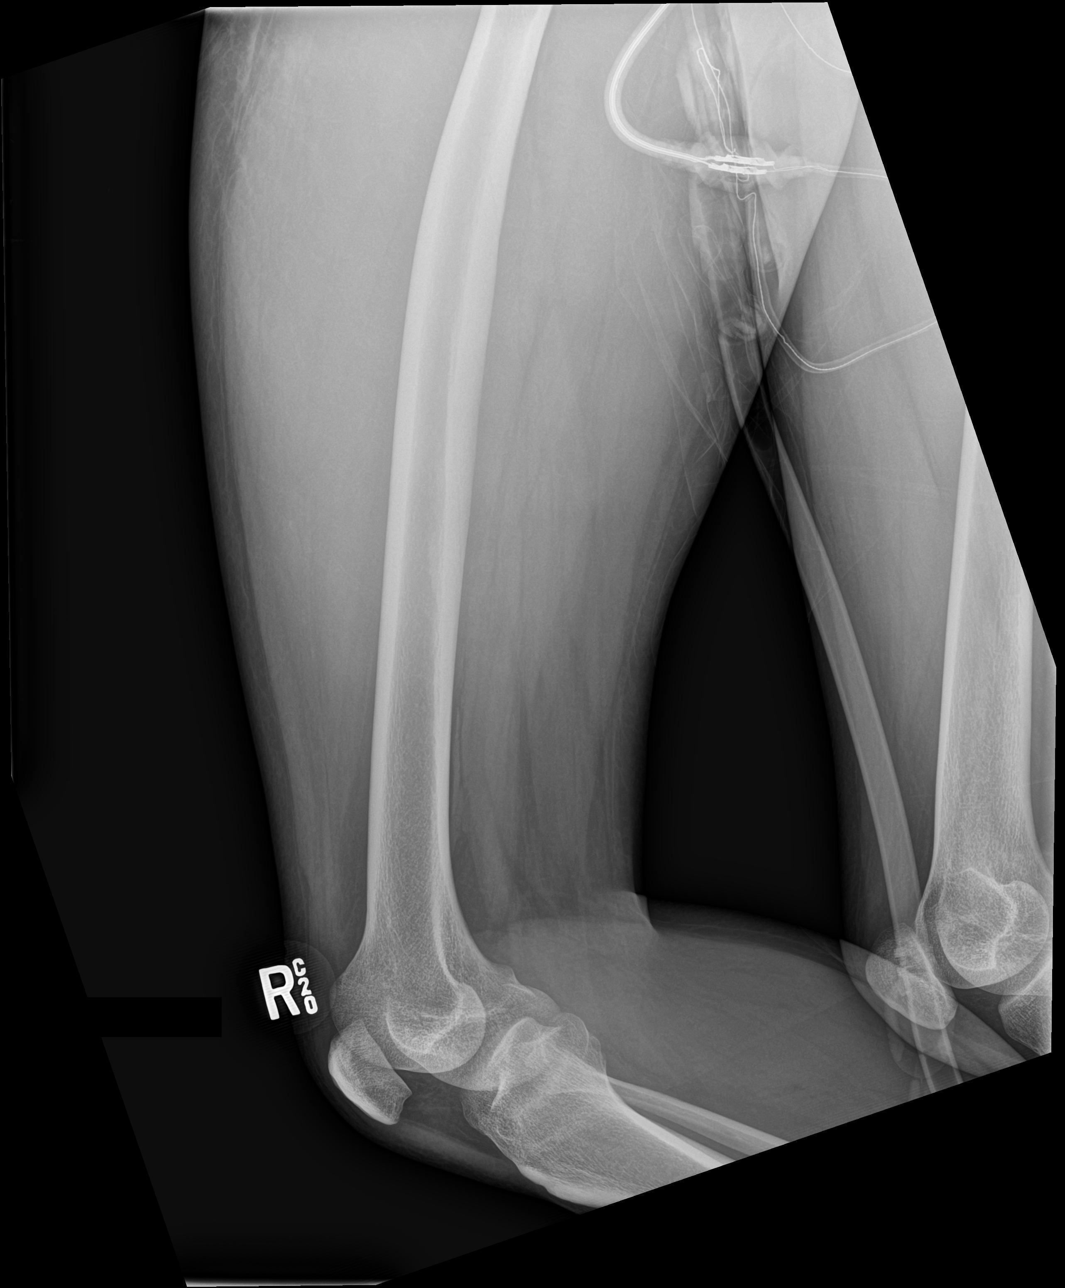

[femur ap (1 of 2)]
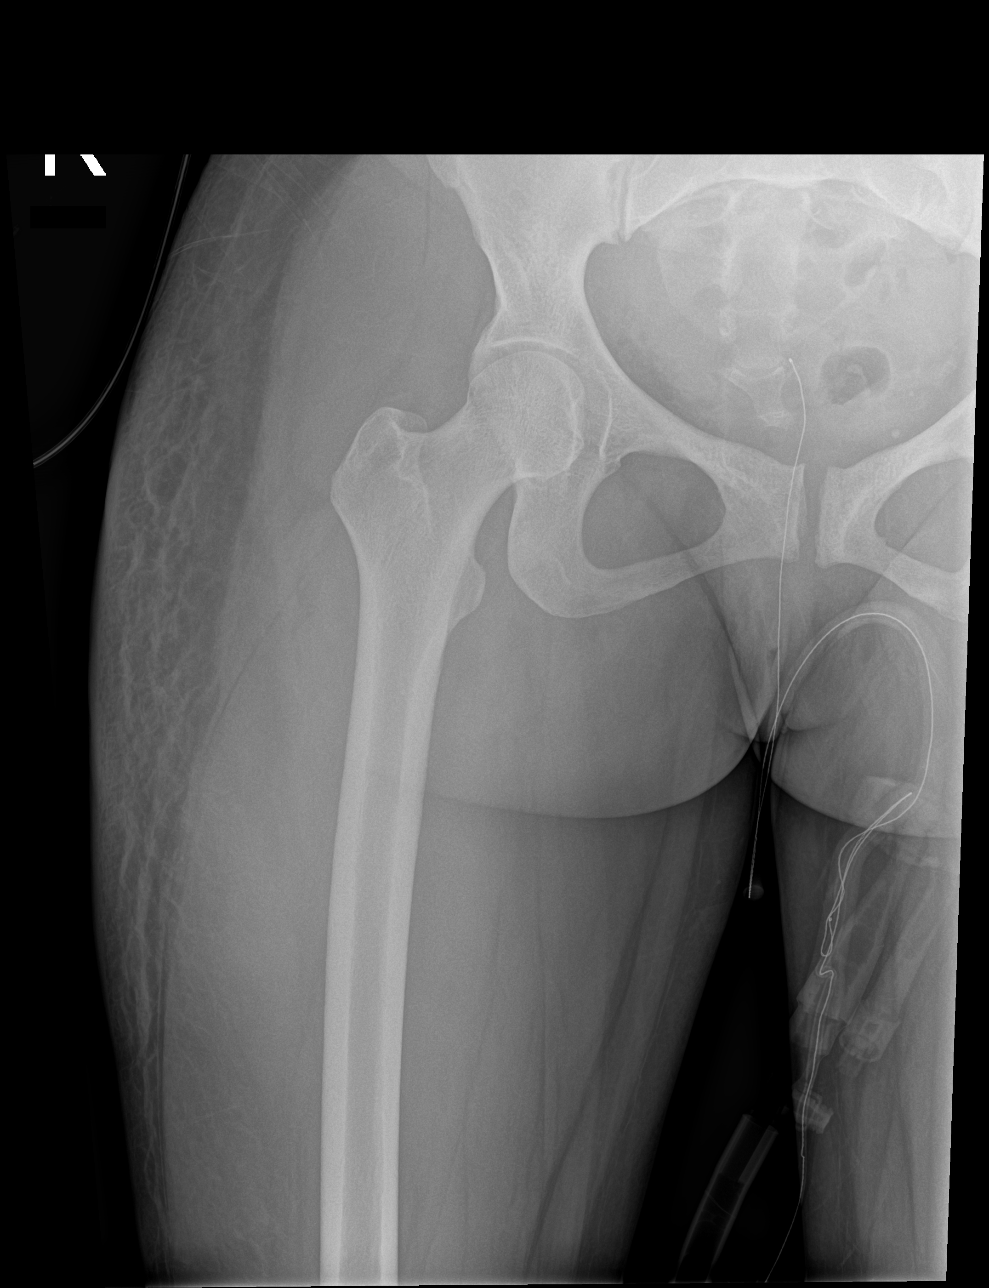

[femur ap (2 of 2)]
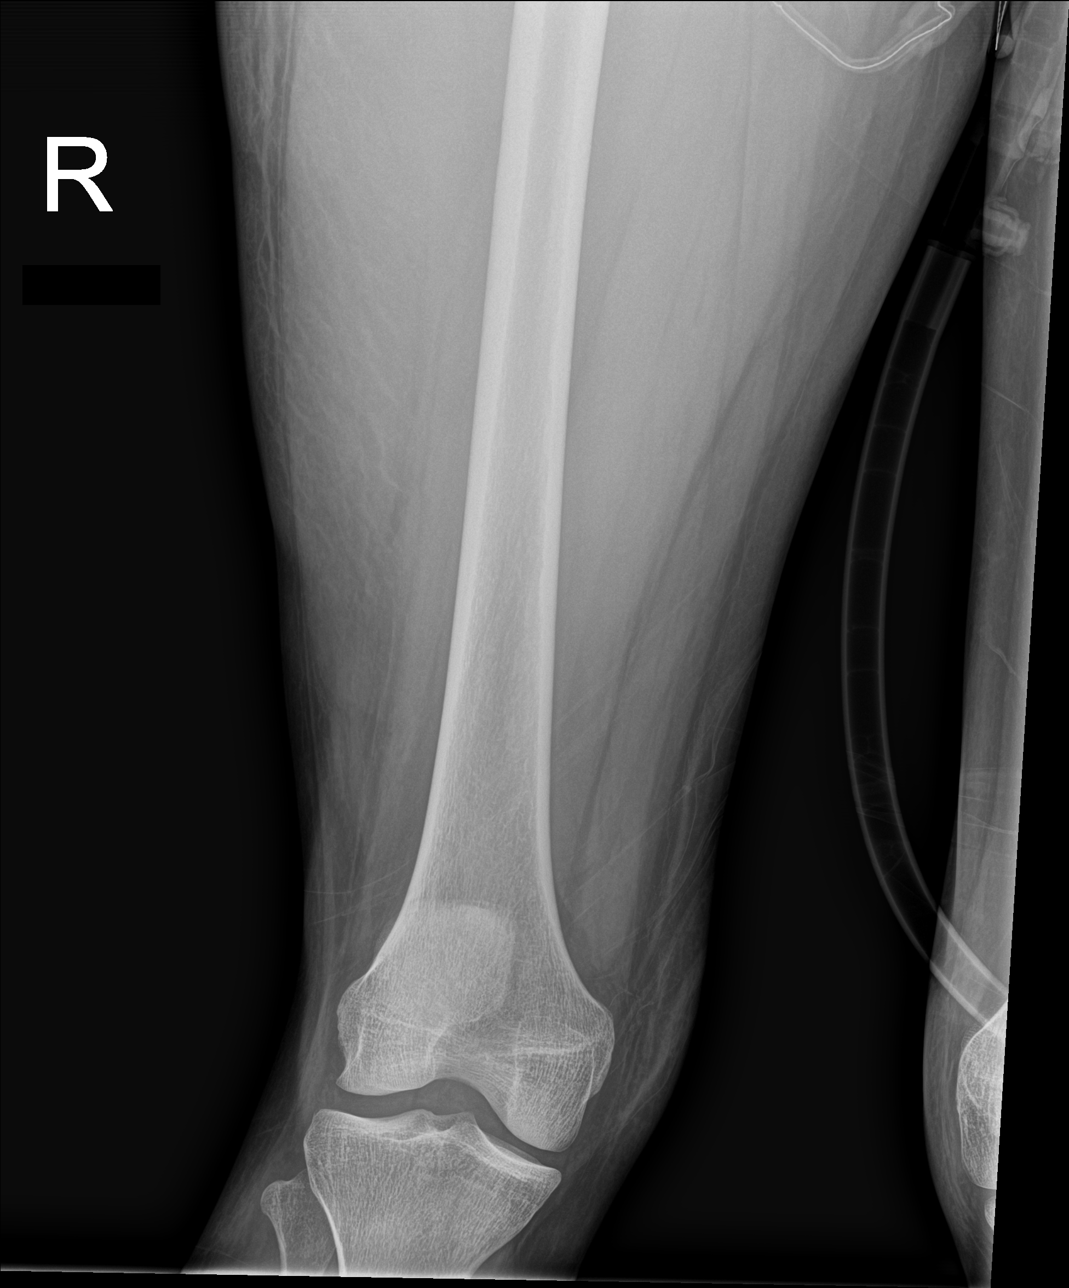

[4 of 4 positions shown; findings below may reference images not displayed]

FINDINGS: There is no acute displaced fracture or dislocation. There is
nonspecific soft tissue swelling about the lateral proximal aspect
of the right thigh. There is no radiopaque foreign body. A Foley
catheter is noted.
IMPRESSION: No acute displaced fracture or dislocation. Nonspecific soft tissue
swelling about the lateral proximal aspect of the right thigh.

## 2019-07-12 IMAGING — MR MR MRA HEAD W/O CM
1 of 3 series · 15 of 48 positions shown · non-contrast
Comparison: MRI head [DATE], CT head/cervical spine [DATE]
COMPARISON: MRI head [DATE], CT head/cervical spine [DATE]

Addendum:
CLINICAL DATA: Stroke, follow-up.

EXAM:
MRA NECK without CONTRAST
MRA HEAD WITHOUT CONTRAST
TECHNIQUE: Multiplanar and multiecho pulse sequences of the neck were obtained
without contrast. Angiographic images of the neck were obtained
using MRA technique without and with intravenous contast.;
Angiographic images of the Circle of Willis were obtained using MRA
technique without intravenous contrast.

[Series 2: ax (id) · axial · 1.0mm · 0.43mm/px · z∈[-120,-28]mm · 15 of 206 slices shown]
[im 1/206]
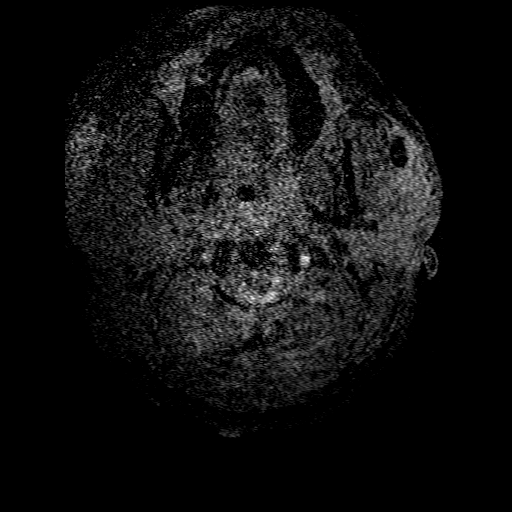
[im 9/206]
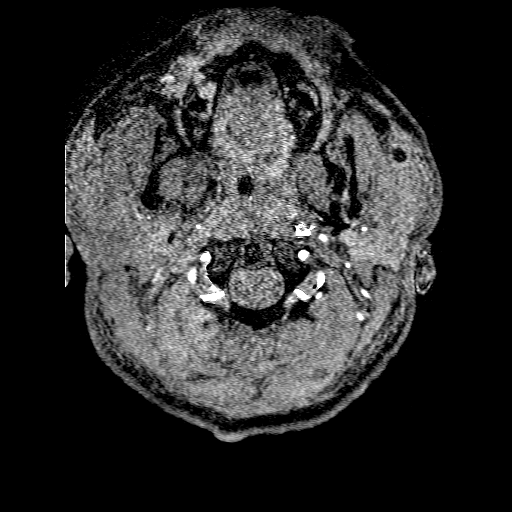
[im 18/206]
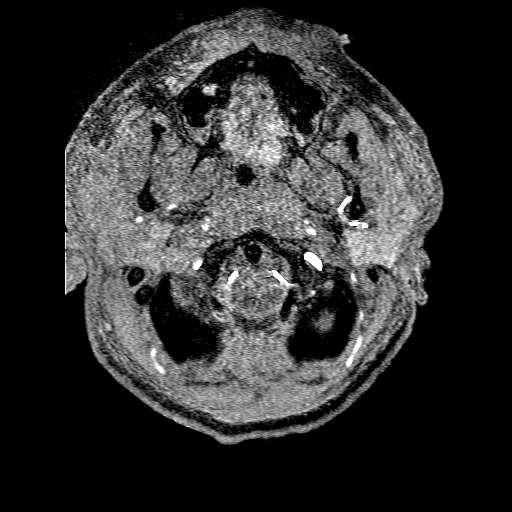
[im 26/206]
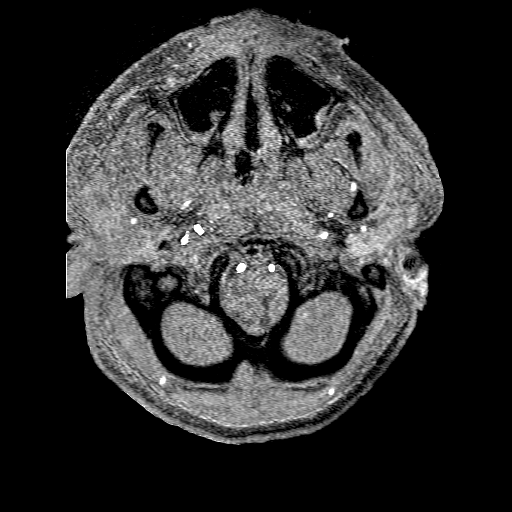
[im 35/206]
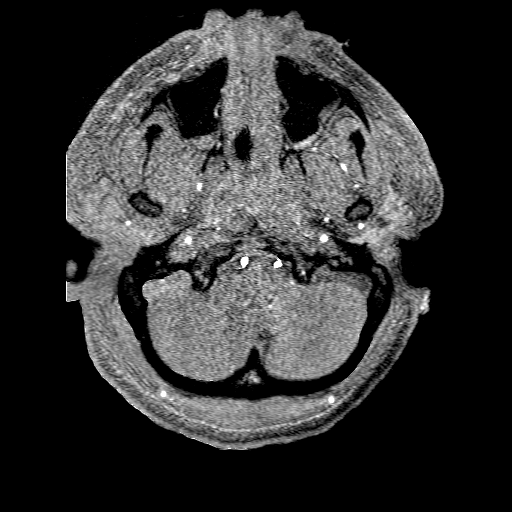
[im 43/206]
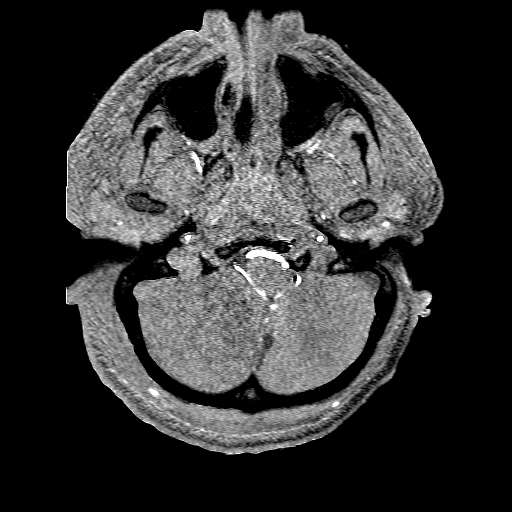
[im 52/206]
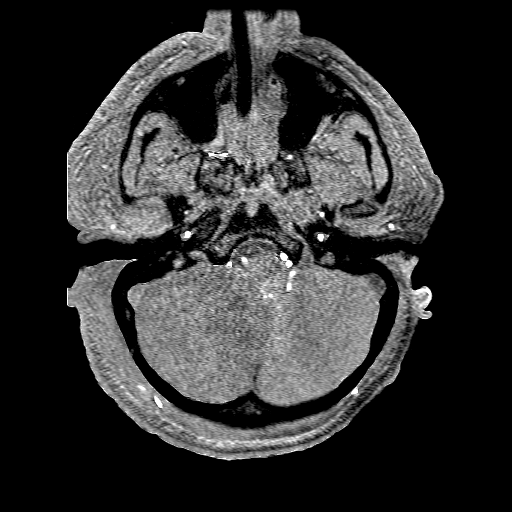
[im 60/206]
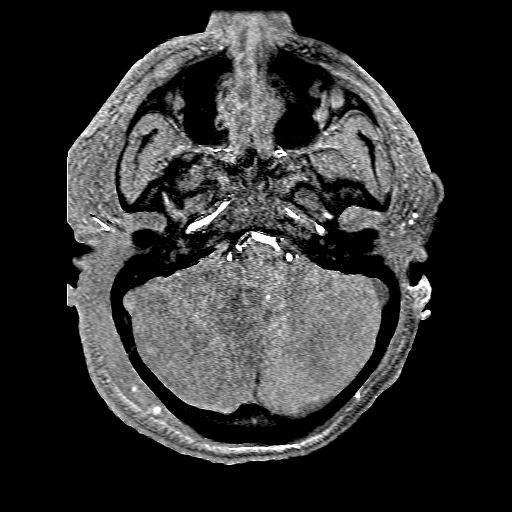
[im 69/206]
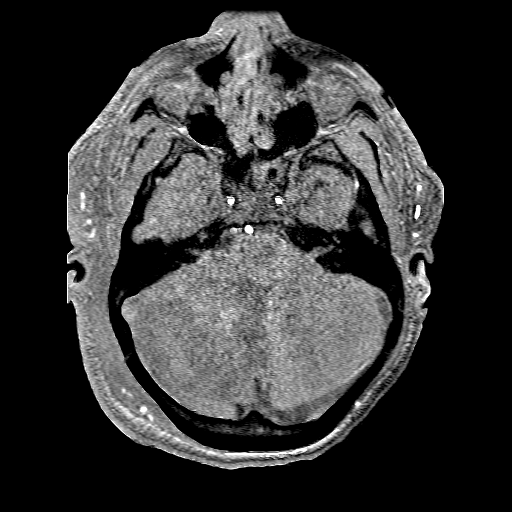
[im 86/206]
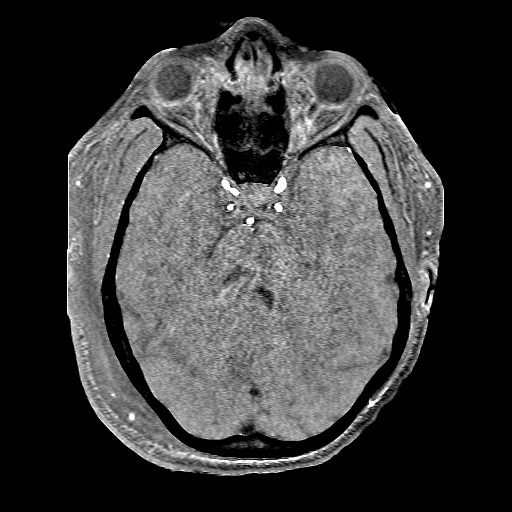
[im 103/206]
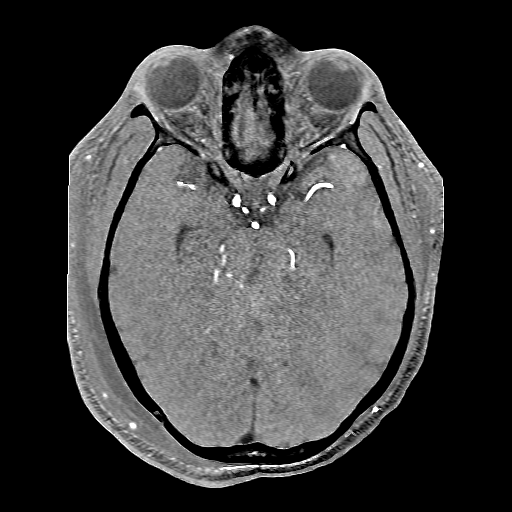
[im 120/206]
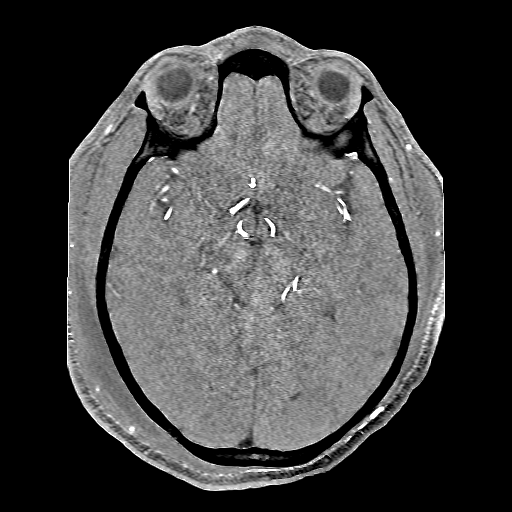
[im 146/206]
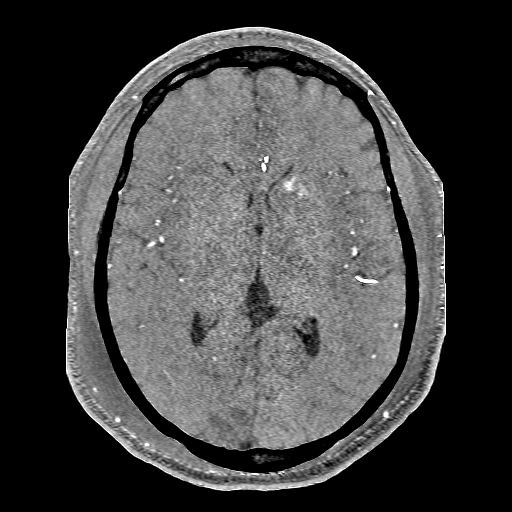
[im 171/206]
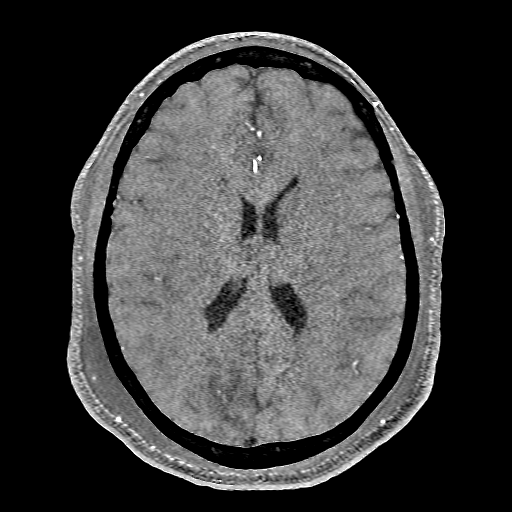
[im 197/206]
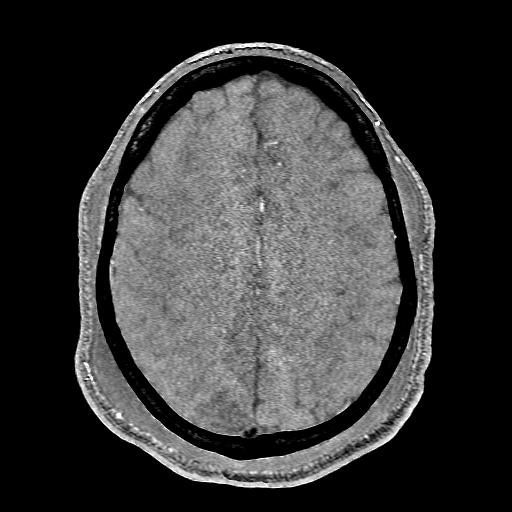

[15 of 48 positions shown; findings below may reference images not displayed]

FINDINGS: MRA NECK FINDINGS

The left vertebral artery arises directly from the aortic arch.
Otherwise standard aortic branching.

The right common carotid artery is patent within the neck without
stenosis. Diffusely narrowed appearance of the right ICA beyond the
carotid bulb. There is a 3 mm vascular protrusion arising from the
distal cervical ICA measuring 3 mm, just beneath the level of the
skull base suspicious for pseudoaneurysm (series 4, image 12).

The left common carotid artery is patent without stenosis. Diffusely
narrowed appearance of the cervical left ICA beyond the carotid
bulb. There is a an irregular vascular protrusion arising from the
distal left cervical ICA measuring 3 mm, just beneath the level of
the skull base suspicious for pseudoaneurysm (series 4, image 24).

The vertebral arteries are codominant and patent within the neck
bilaterally. There is mild focal irregularity of the right vertebral
artery at the C2 level, this is nonspecific but may be posttraumatic
in etiology (series 454, image 15). There is fusiform dilation of
the left vertebral artery to 6 mm within the left C2 transverse
foramen (series 4, image 34), suspicious for pseudoaneurysm.

MRA HEAD FINDINGS

The internal carotid arteries remain somewhat narrowed in appearance
intracranially, but patent. The bilateral middle and anterior
cerebral arteries are patent without proximal branch occlusion or
high-grade proximal stenosis. No intracranial aneurysm is
identified.

The intracranial vertebral arteries are patent bilaterally. The
basilar artery is patent without significant stenosis. Flow is seen
within the proximal PICA vessels bilaterally. Some flow is seen
within the proximal AICA vessels bilaterally. Flow is seen within
the proximal superior cerebellar arteries bilaterally. The posterior
cerebral arteries are patent bilaterally without significant
proximal stenosis. Posterior communicating arteries are present
bilaterally.

Redemonstrated small focus of T1 hyperintensity within the left
caudate, which may reflect subacute blood products.
IMPRESSION: MRA NECK:

1. Diffusely narrowed appearance of the cervical internal carotid
arteries beyond the carotid bulbs bilaterally, which may reflect
sequela of dissection. 3 mm vascular protrusions of the distal
cervical ICAs bilaterally suspicious for pseudoaneurysms.
2. Fusiform dilation of the left vertebral artery within the left C2
transverse foramen, likely reflecting pseudoaneurysm.
3. Mild focal irregularity of the right vertebral artery at the C2
level, nonspecific but possibly posttraumatic.

MRA HEAD:

1. The internal carotid arteries remain somewhat narrowed in
appearance intracranially, but patent.
2. No proximal branch occlusion or high-grade proximal stenosis
elsewhere.

ADDENDUM:
These results were called by telephone at the time of interpretation
on [DATE] at [DATE] to provider ZSCHORNACK , who verbally
acknowledged these results.

*** End of Addendum ***
FINDINGS: MRA NECK FINDINGS

The left vertebral artery arises directly from the aortic arch.
Otherwise standard aortic branching.

The right common carotid artery is patent within the neck without
stenosis. Diffusely narrowed appearance of the right ICA beyond the
carotid bulb. There is a 3 mm vascular protrusion arising from the
distal cervical ICA measuring 3 mm, just beneath the level of the
skull base suspicious for pseudoaneurysm (series 4, image 12).

The left common carotid artery is patent without stenosis. Diffusely
narrowed appearance of the cervical left ICA beyond the carotid
bulb. There is a an irregular vascular protrusion arising from the
distal left cervical ICA measuring 3 mm, just beneath the level of
the skull base suspicious for pseudoaneurysm (series 4, image 24).

The vertebral arteries are codominant and patent within the neck
bilaterally. There is mild focal irregularity of the right vertebral
artery at the C2 level, this is nonspecific but may be posttraumatic
in etiology (series 454, image 15). There is fusiform dilation of
the left vertebral artery to 6 mm within the left C2 transverse
foramen (series 4, image 34), suspicious for pseudoaneurysm.

MRA HEAD FINDINGS

The internal carotid arteries remain somewhat narrowed in appearance
intracranially, but patent. The bilateral middle and anterior
cerebral arteries are patent without proximal branch occlusion or
high-grade proximal stenosis. No intracranial aneurysm is
identified.

The intracranial vertebral arteries are patent bilaterally. The
basilar artery is patent without significant stenosis. Flow is seen
within the proximal PICA vessels bilaterally. Some flow is seen
within the proximal AICA vessels bilaterally. Flow is seen within
the proximal superior cerebellar arteries bilaterally. The posterior
cerebral arteries are patent bilaterally without significant
proximal stenosis. Posterior communicating arteries are present
bilaterally.

Redemonstrated small focus of T1 hyperintensity within the left
caudate, which may reflect subacute blood products.
IMPRESSION: MRA NECK:

1. Diffusely narrowed appearance of the cervical internal carotid
arteries beyond the carotid bulbs bilaterally, which may reflect
sequela of dissection. 3 mm vascular protrusions of the distal
cervical ICAs bilaterally suspicious for pseudoaneurysms.
2. Fusiform dilation of the left vertebral artery within the left C2
transverse foramen, likely reflecting pseudoaneurysm.
3. Mild focal irregularity of the right vertebral artery at the C2
level, nonspecific but possibly posttraumatic.

MRA HEAD:

1. The internal carotid arteries remain somewhat narrowed in
appearance intracranially, but patent.
2. No proximal branch occlusion or high-grade proximal stenosis
elsewhere.

## 2019-07-12 IMAGING — MR MR CERVICAL SPINE W/O CM
4 of 5 series · 18 of 48 positions shown · non-contrast
Comparison: CT cervical spine [DATE]

CLINICAL DATA: Neck trauma, abnormal mental status or neuro exam.

EXAM:
MRI CERVICAL SPINE WITHOUT CONTRAST
TECHNIQUE: Multiplanar, multisequence MR imaging of the cervical spine was
performed. No intravenous contrast was administered.

[Series 6: T2 · sagittal · 3.0mm · 0.43mm/px · 5 of 16 slices shown (1 of 2)]
[im 1/16]
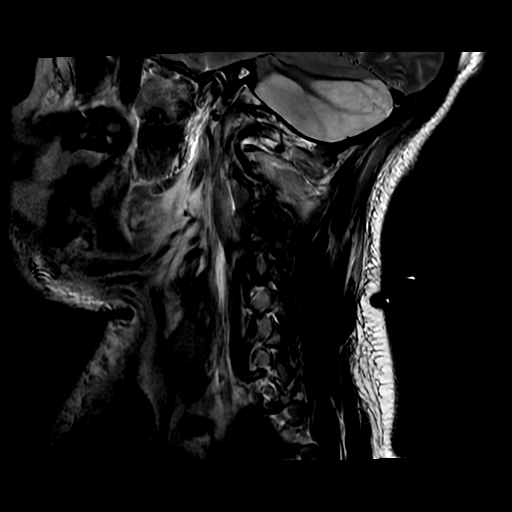
[im 4/16]
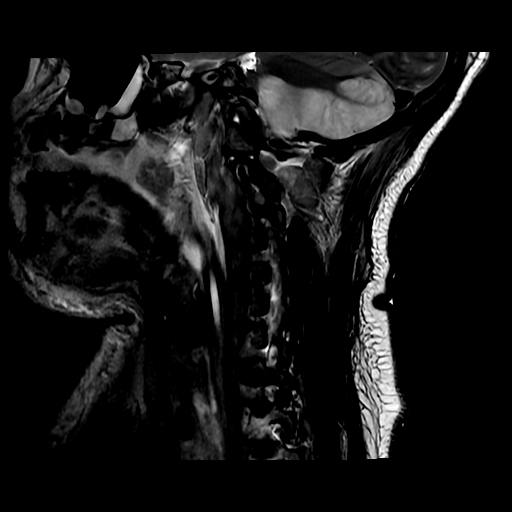
[im 8/16]
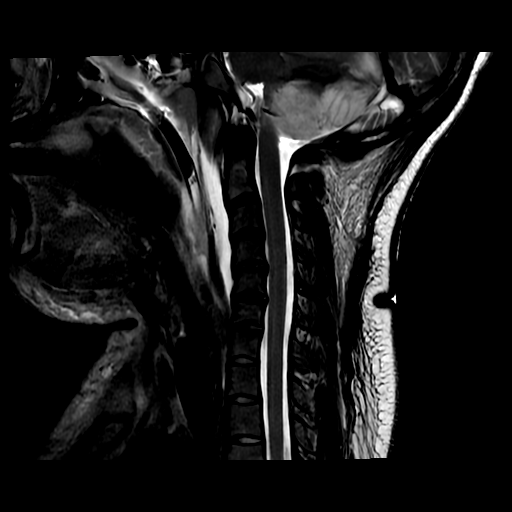
[im 12/16]
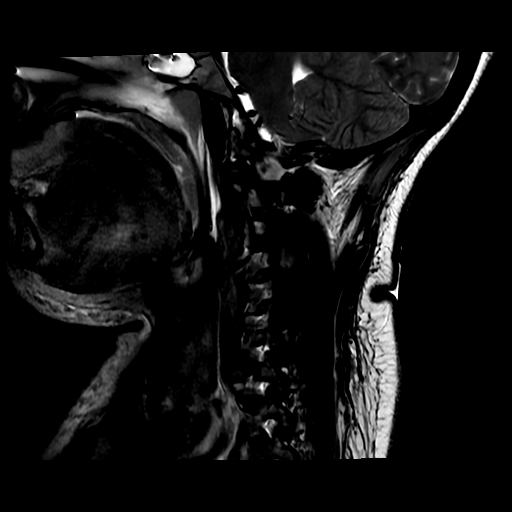
[im 16/16]
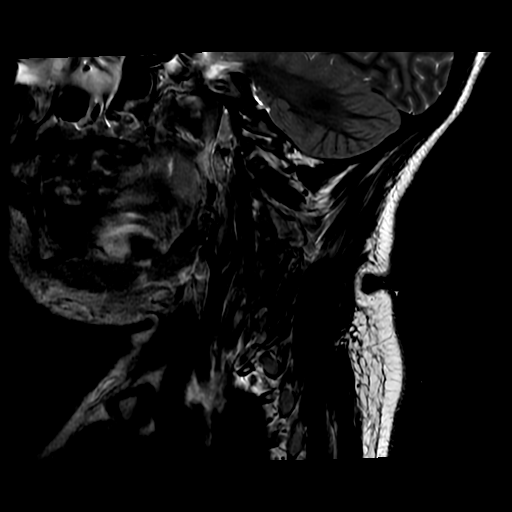

[Series 7: FLAIR · sagittal · 3.0mm · 0.43mm/px · 3 of 16 slices shown]
[im 1/16]
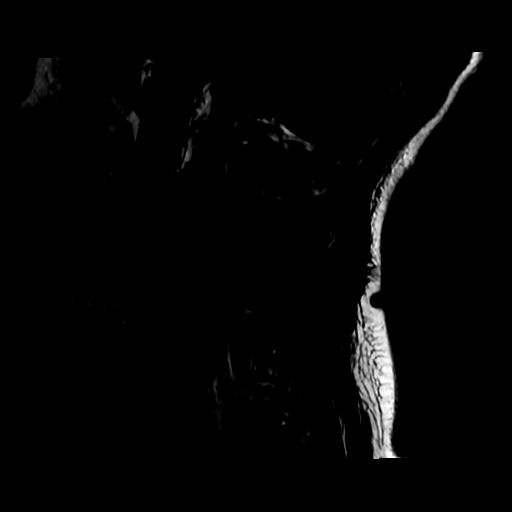
[im 8/16]
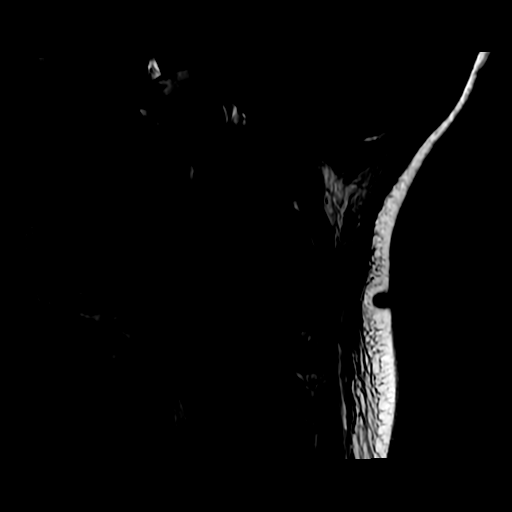
[im 16/16]
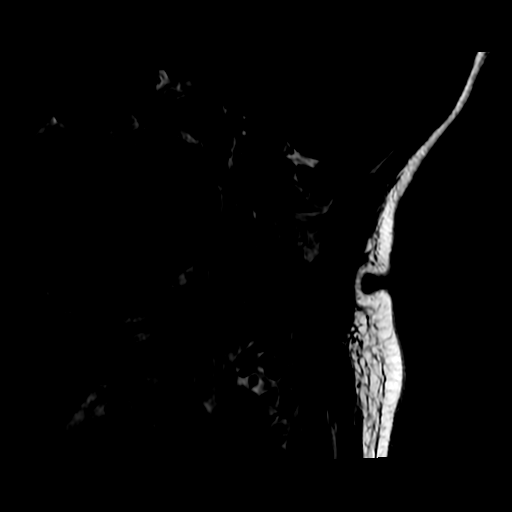

[Series 8: STIR · sagittal · 3.0mm · 0.43mm/px · 3 of 16 slices shown]
[im 1/16]
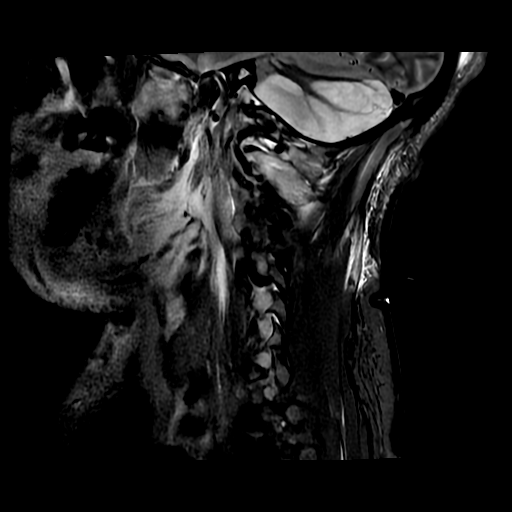
[im 8/16]
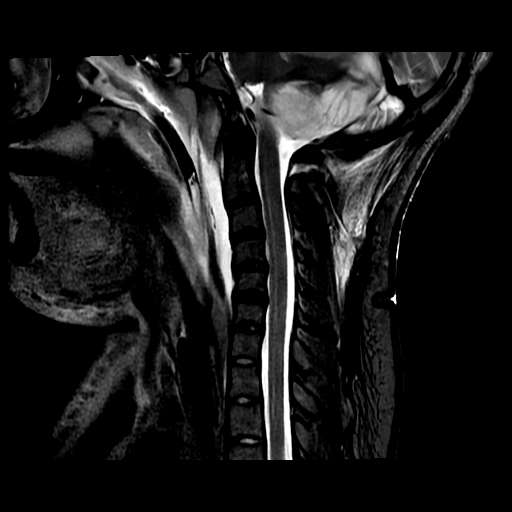
[im 16/16]
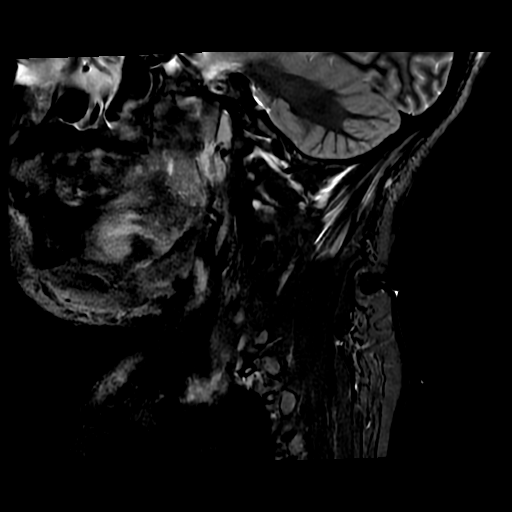

[Series 10: T2 · axial · 3.0mm · 0.35mm/px · z∈[-217,-150]mm · 7 of 27 slices shown (2 of 2)]
[im 1/27]
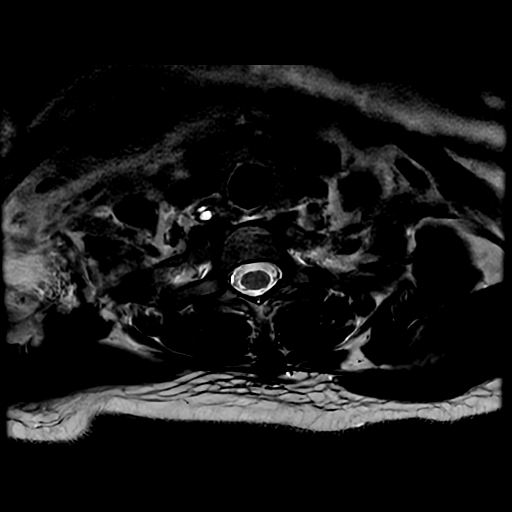
[im 4/27]
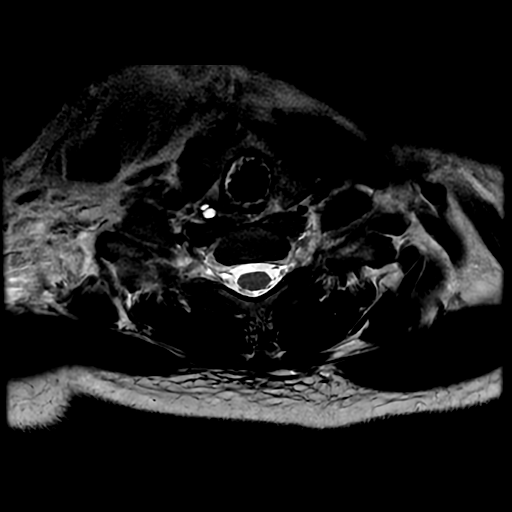
[im 7/27]
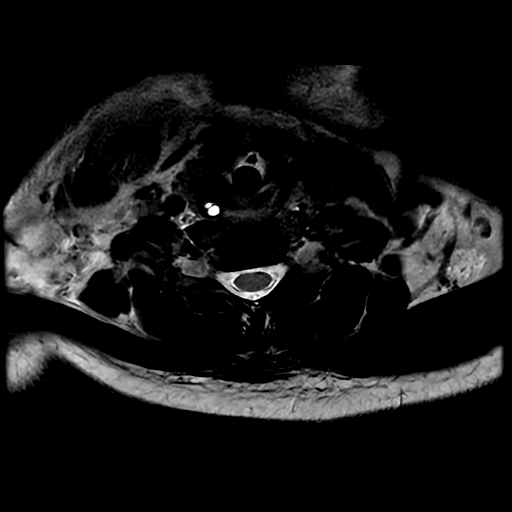
[im 10/27]
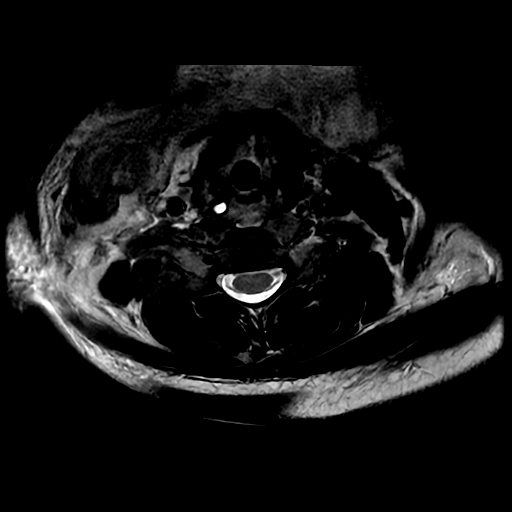
[im 14/27]
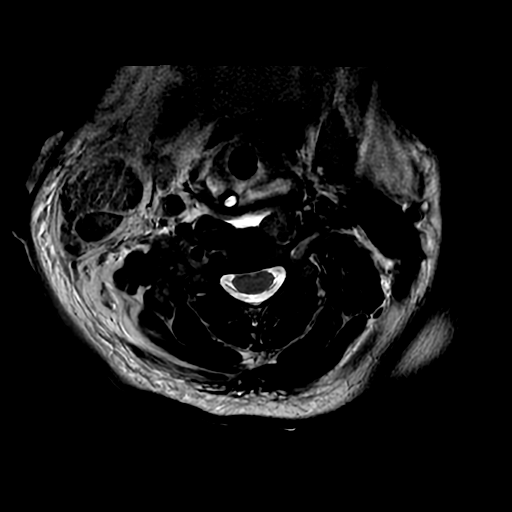
[im 17/27]
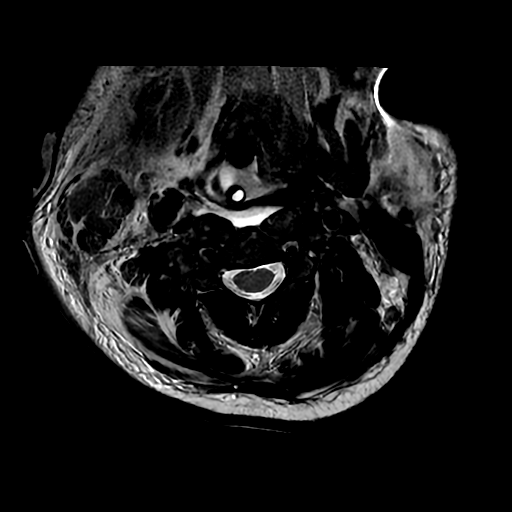
[im 23/27]
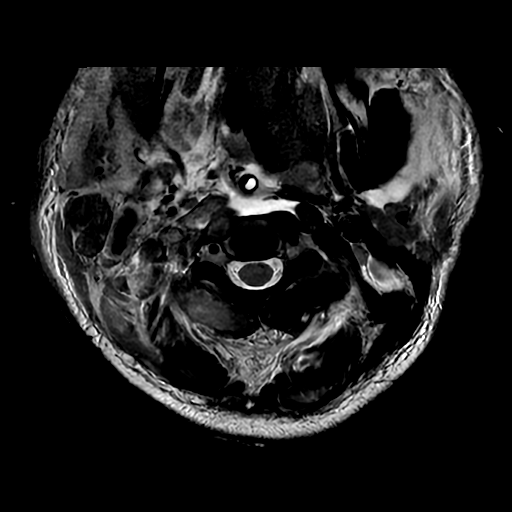

[18 of 48 positions shown; findings below may reference images not displayed]

FINDINGS: Alignment: Reversal of the expected cervical lordosis. No
significant spondylolisthesis.

Vertebrae: Vertebral body height is maintained. No significant
marrow edema to suggest occult cervical spine fracture.

Cord: No spinal cord signal abnormality

Posterior Fossa, vertebral arteries, paraspinal tissues:
Redemonstrated right cerebellar acute infarct. Vertebral arteries
better assessed on concurrent MRA neck. There is significant soft
tissue and intramuscular edema within bilateral neck (greater on the
right). This includes prominent edema throughout the right
sternocleidomastoid muscle. A edema also extends into the
parapharyngeal spaces bilaterally. There is more focal edema within
the dorsal paraspinal musculature along the left C4 lamina (series
8, image 12). There is prevertebral edema versus hematoma extending
from the skull base to the C6 level, measuring up to 8 mm in AP
dimension. Partially visualized support tubes. Partially visualized
support tubes.

Disc levels:

Mild-to-moderate disc degeneration greatest at C4-C5 and C5-C6.

C2-C3: No disc herniation. No significant canal or foraminal
stenosis.

C3-C4: No disc herniation. No significant canal or foraminal
stenosis.

C4-C5: Minimal disc bulge. No significant spinal canal or neural
foraminal narrowing.

C5-C6: Minimal disc bulge and right-sided uncinate hypertrophy. No
significant spinal canal or neural foraminal narrowing.

C6-C7: No disc herniation. No significant canal or foraminal
stenosis.

C7-T1: No disc herniation. No significant canal or foraminal
stenosis.
IMPRESSION: Mild cervical spondylosis without significant spinal canal or neural
foraminal narrowing at any level.

No evidence of spinal cord signal abnormality or epidural hematoma.

Marked nonspecific soft tissue and intramuscular edema within the
bilateral neck (greater on the right). This includes significant
edema and swelling throughout the right sternocleidomastoid muscle.
Edema also extends of the parapharyngeal spaces bilaterally. There
is more focal edema within the left paraspinal musculature along the
left C4 lamina.

Prevertebral edema versus hematoma extending from the skull base to
the C6 level, measuring up to 8 mm in AP dimension.

## 2019-07-12 IMAGING — CT CT CERVICAL SPINE W/O CM
4 series · 15 of 34 positions shown, 18 images · non-contrast
Comparison: None.

CLINICAL DATA: Unresponsive. Unknown down time. Found unresponsive
in bathroom.

EXAM:
CT CERVICAL SPINE WITHOUT CONTRAST
TECHNIQUE: Multidetector CT imaging of the cervical spine was performed without
intravenous contrast. Multiplanar CT image reconstructions were also
generated.

[Series 5: c spine soft · axial · 0.35mm/px · z∈[+922,+950]mm · 2 of 84 slices shown]
[im 14/84  soft-tissue]
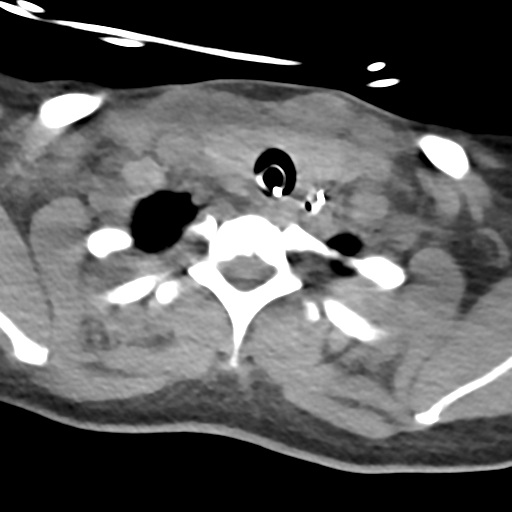
[im 28/84  soft-tissue]
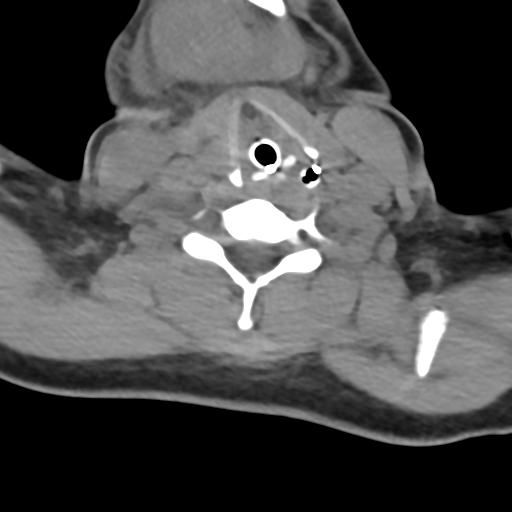

[Series 8: sag bone · sagittal · 0.33mm/px · 5 of 81 slices shown, 6 images]
[im 27/81  bone]
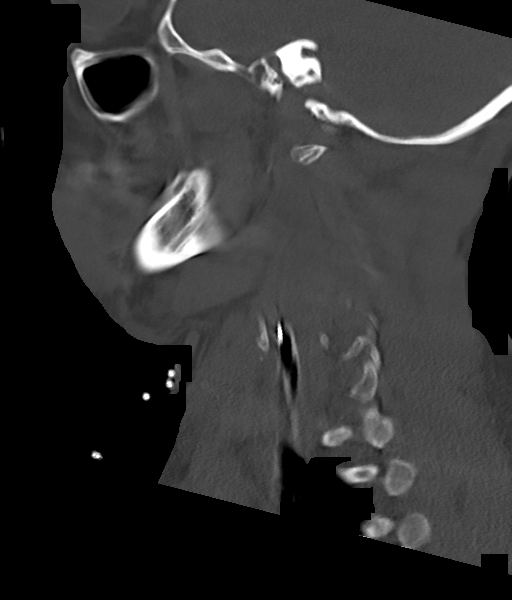
[im 34/81  bone]
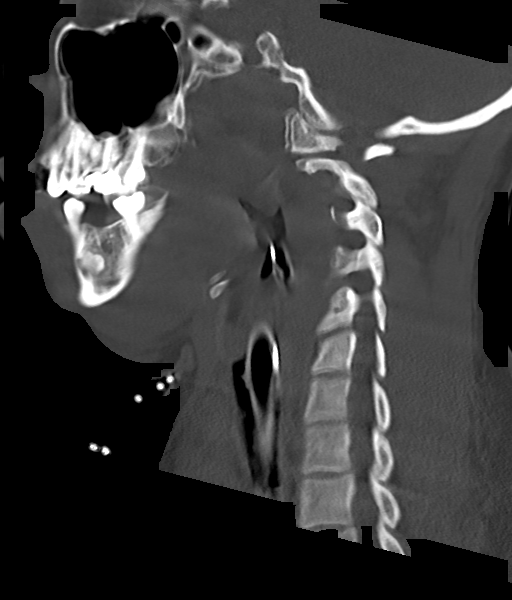
[im 41/81  soft-tissue]
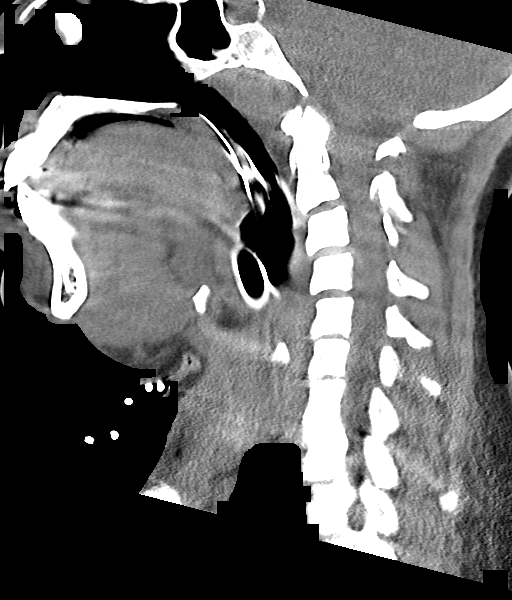
[im 41/81  bone]
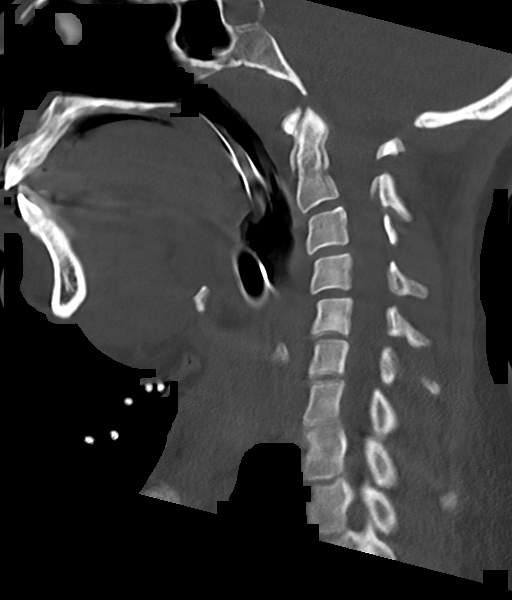
[im 47/81  bone]
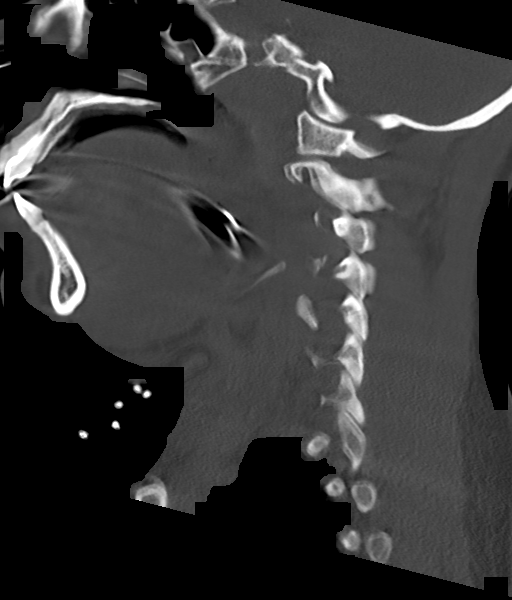
[im 54/81  bone]
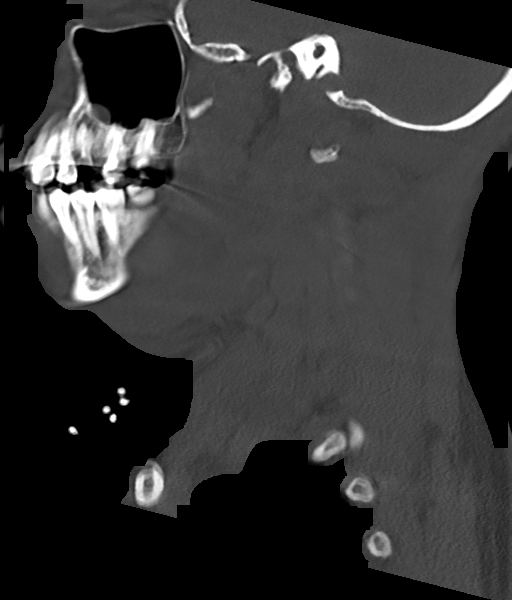

[Series 9: cor bone · coronal · 0.32mm/px · 3 of 70 slices shown]
[im 14/70  bone]
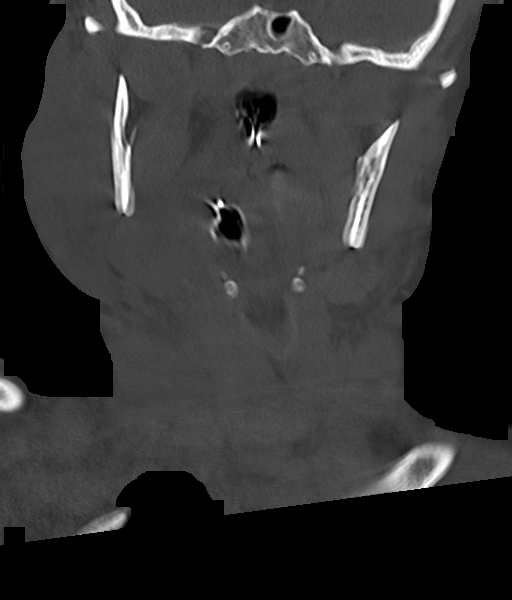
[im 28/70  bone]
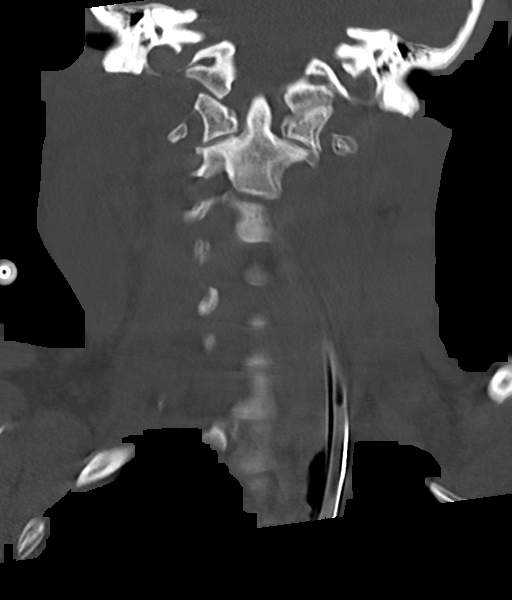
[im 42/70  bone]
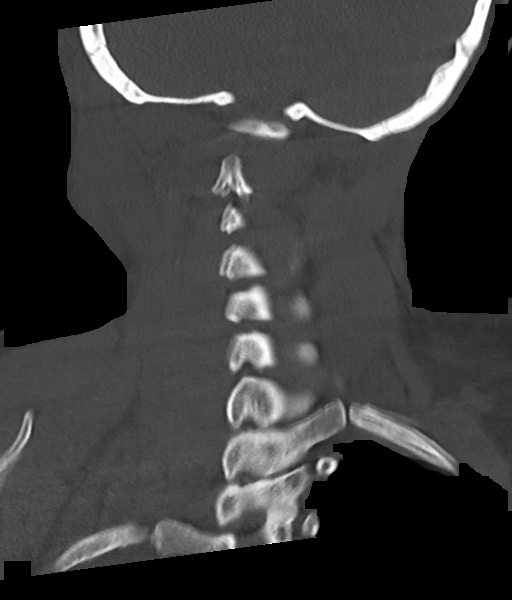

[Series 10: orthogonal axials · axial · 0.21mm/px · z∈[+926,+1020]mm · 5 of 75 slices shown, 7 images]
[im 13/75  soft-tissue]
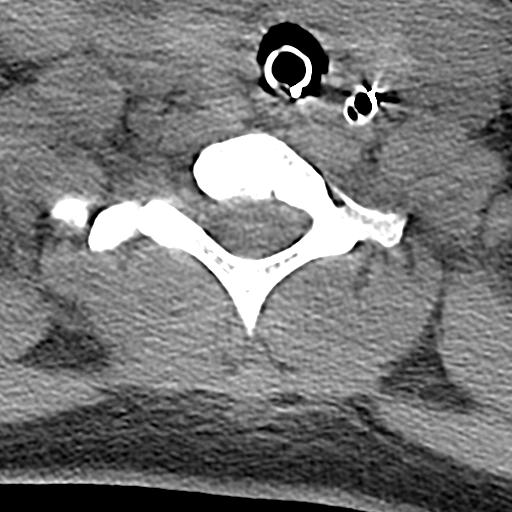
[im 13/75  bone]
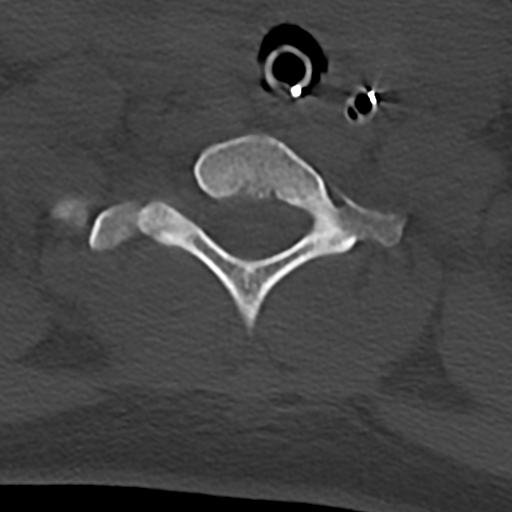
[im 25/75  bone]
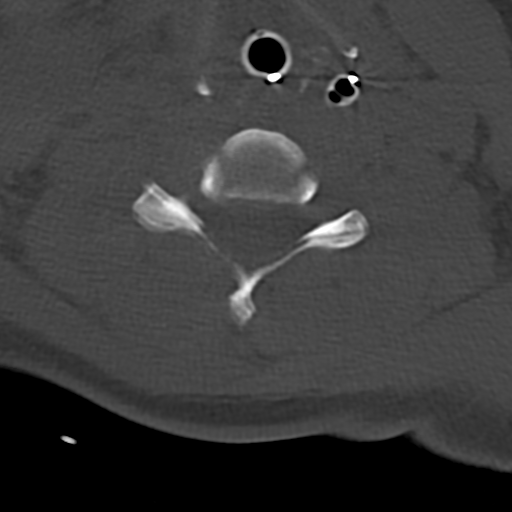
[im 38/75  bone]
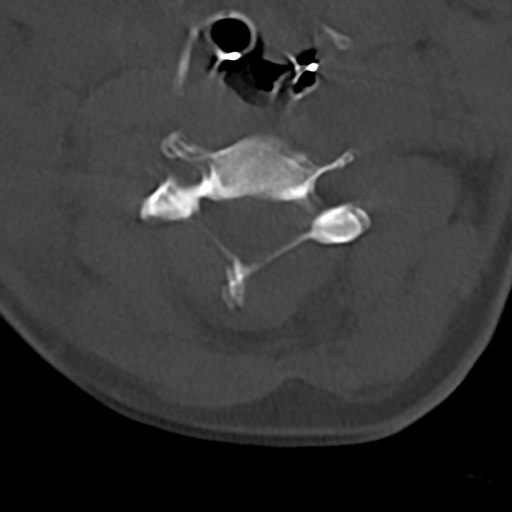
[im 50/75  bone]
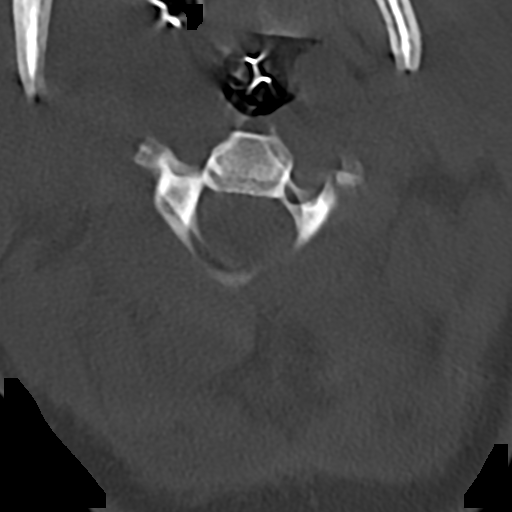
[im 62/75  soft-tissue]
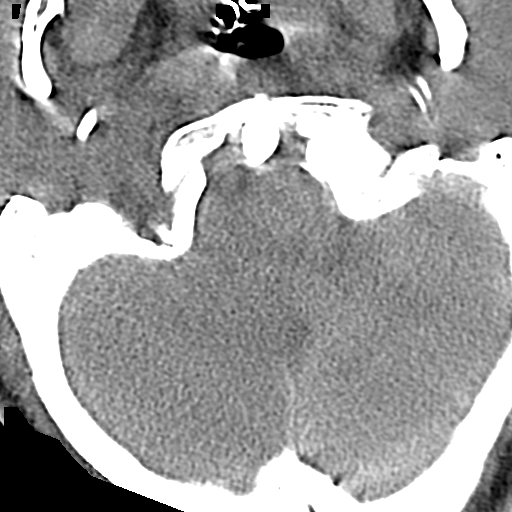
[im 62/75  bone]
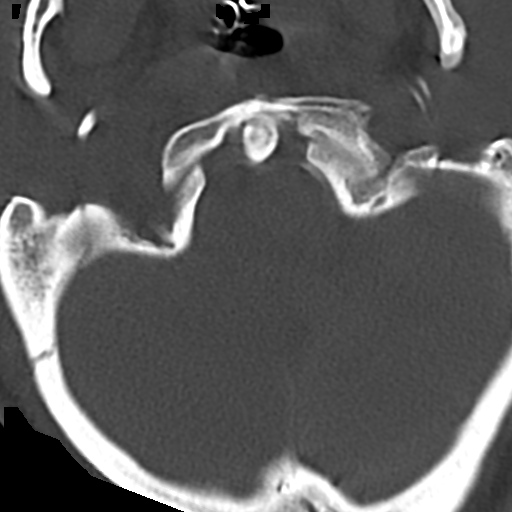

[15 of 34 positions shown; findings below may reference images not displayed]

FINDINGS: Motion artifact, especially through C2.

Alignment: Straightening of normal lordosis. No traumatic
subluxation.

Skull base and vertebrae: No evidence of fracture allowing for
motion artifact limitations. Vertebral body heights are preserved.
No focal pathologic process.

Soft tissues and spinal canal: No gross prevertebral soft tissue
edema. No evidence of canal hematoma, allowing for motion
limitations.

Disc levels:  Normal.

Upper chest: Lung apices are clear.

Other: None.
IMPRESSION: Motion limited exam. No evidence of fracture or traumatic
subluxation of the cervical spine.

## 2019-07-12 IMAGING — MR MR MRA NECK W/O CM
1 of 3 series · 15 of 48 positions shown · non-contrast
Comparison: MRI head [DATE], CT head/cervical spine [DATE]
COMPARISON: MRI head [DATE], CT head/cervical spine [DATE]

Addendum:
CLINICAL DATA: Stroke, follow-up.

EXAM:
MRA NECK without CONTRAST
MRA HEAD WITHOUT CONTRAST
TECHNIQUE: Multiplanar and multiecho pulse sequences of the neck were obtained
without contrast. Angiographic images of the neck were obtained
using MRA technique without and with intravenous contast.;
Angiographic images of the Circle of Willis were obtained using MRA
technique without intravenous contrast.

[Series 2: ax (id) · axial · 1.0mm · 0.43mm/px · z∈[-120,-28]mm · 15 of 206 slices shown]
[im 1/206]
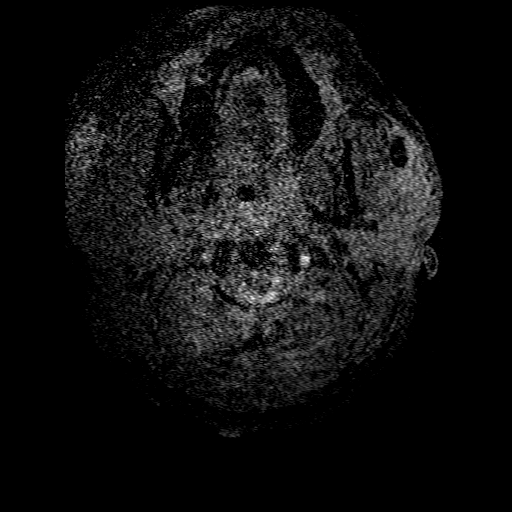
[im 9/206]
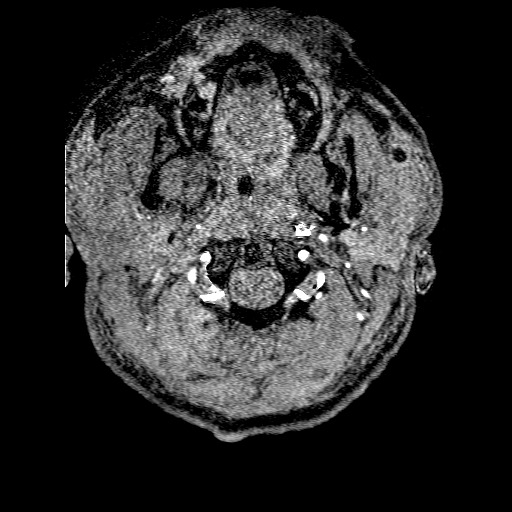
[im 18/206]
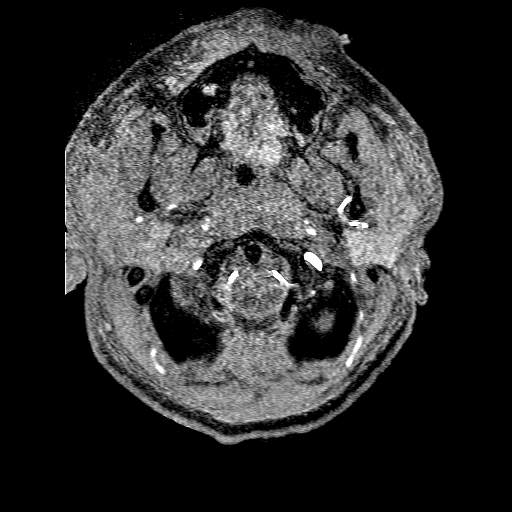
[im 26/206]
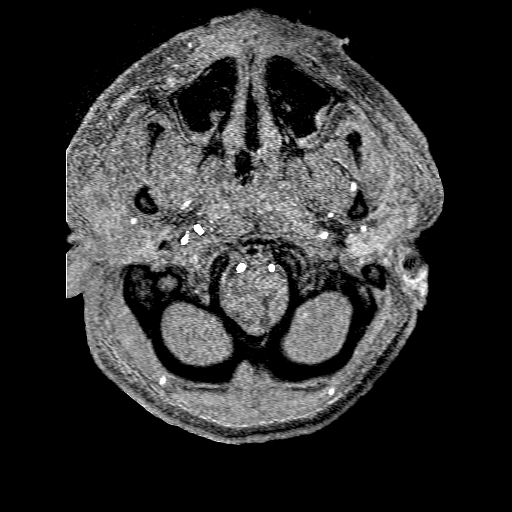
[im 35/206]
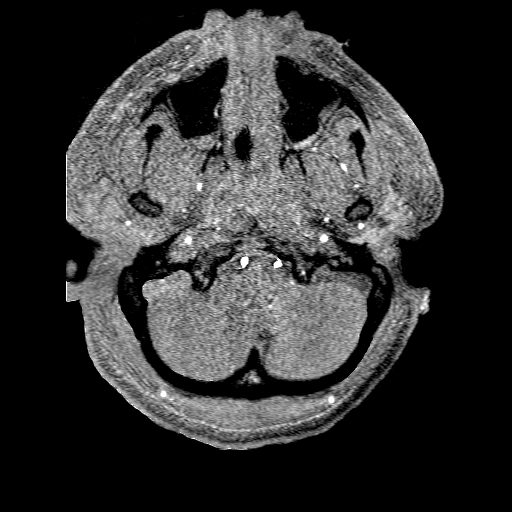
[im 43/206]
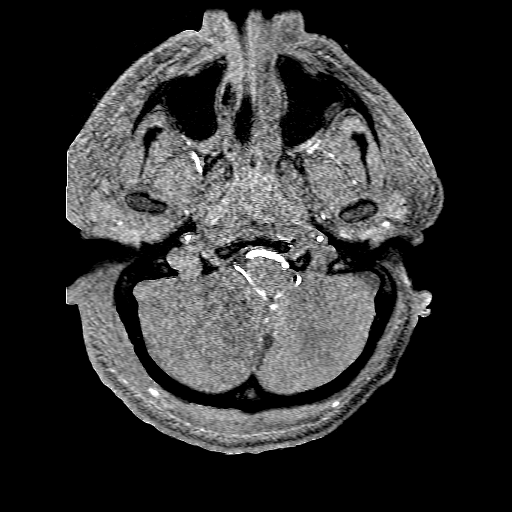
[im 52/206]
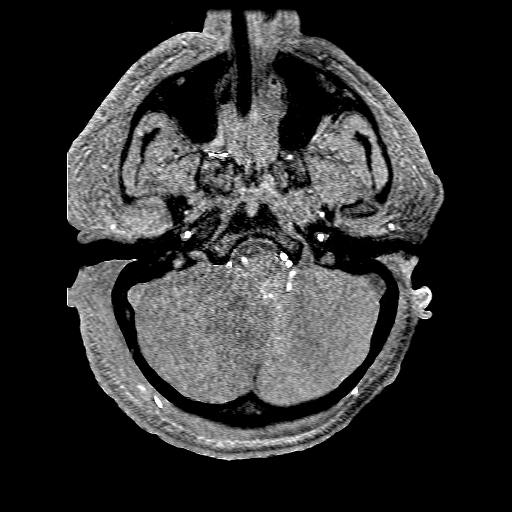
[im 60/206]
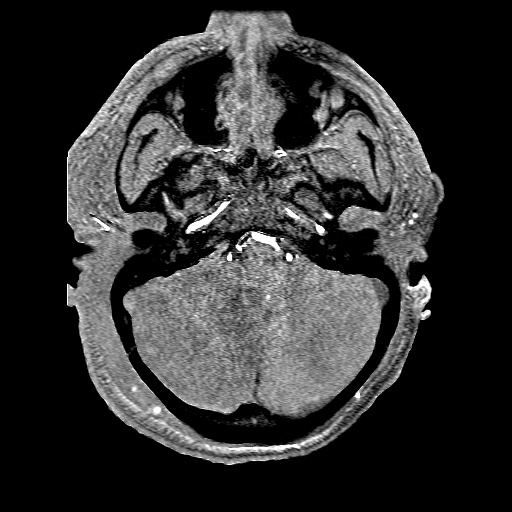
[im 69/206]
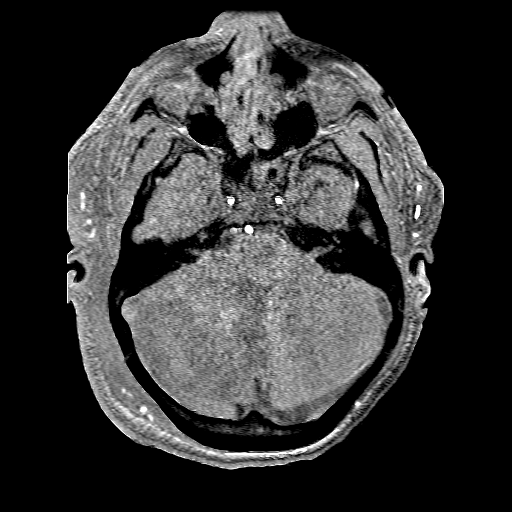
[im 86/206]
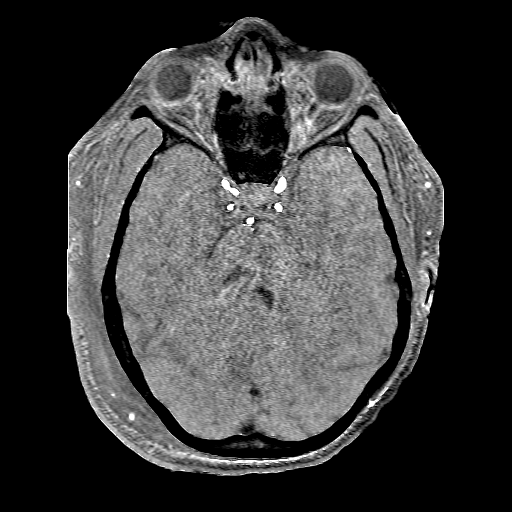
[im 103/206]
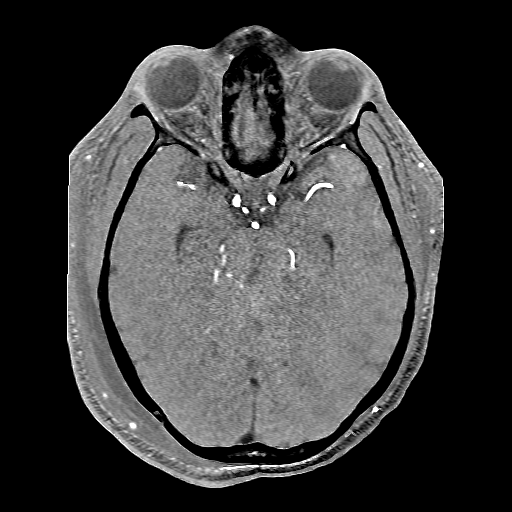
[im 120/206]
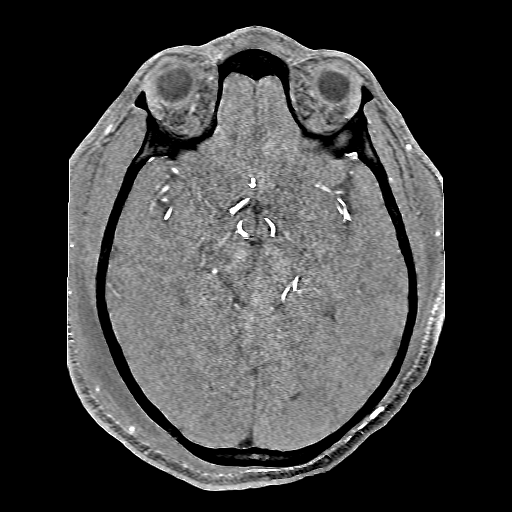
[im 146/206]
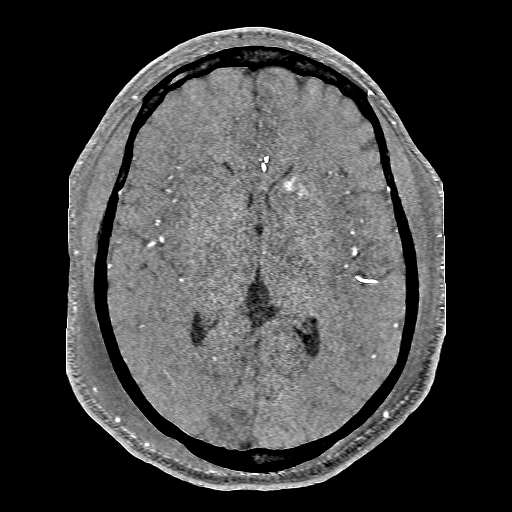
[im 171/206]
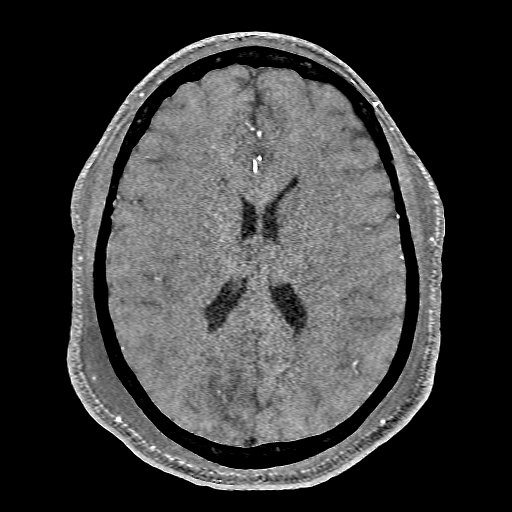
[im 197/206]
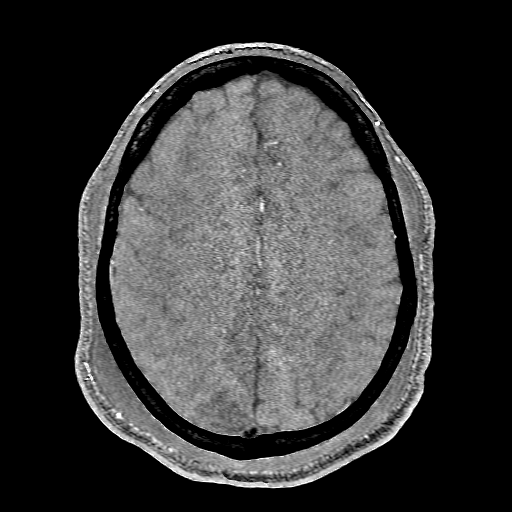

[15 of 48 positions shown; findings below may reference images not displayed]

FINDINGS: MRA NECK FINDINGS

The left vertebral artery arises directly from the aortic arch.
Otherwise standard aortic branching.

The right common carotid artery is patent within the neck without
stenosis. Diffusely narrowed appearance of the right ICA beyond the
carotid bulb. There is a 3 mm vascular protrusion arising from the
distal cervical ICA measuring 3 mm, just beneath the level of the
skull base suspicious for pseudoaneurysm (series 4, image 12).

The left common carotid artery is patent without stenosis. Diffusely
narrowed appearance of the cervical left ICA beyond the carotid
bulb. There is a an irregular vascular protrusion arising from the
distal left cervical ICA measuring 3 mm, just beneath the level of
the skull base suspicious for pseudoaneurysm (series 4, image 24).

The vertebral arteries are codominant and patent within the neck
bilaterally. There is mild focal irregularity of the right vertebral
artery at the C2 level, this is nonspecific but may be posttraumatic
in etiology (series 454, image 15). There is fusiform dilation of
the left vertebral artery to 6 mm within the left C2 transverse
foramen (series 4, image 34), suspicious for pseudoaneurysm.

MRA HEAD FINDINGS

The internal carotid arteries remain somewhat narrowed in appearance
intracranially, but patent. The bilateral middle and anterior
cerebral arteries are patent without proximal branch occlusion or
high-grade proximal stenosis. No intracranial aneurysm is
identified.

The intracranial vertebral arteries are patent bilaterally. The
basilar artery is patent without significant stenosis. Flow is seen
within the proximal PICA vessels bilaterally. Some flow is seen
within the proximal AICA vessels bilaterally. Flow is seen within
the proximal superior cerebellar arteries bilaterally. The posterior
cerebral arteries are patent bilaterally without significant
proximal stenosis. Posterior communicating arteries are present
bilaterally.

Redemonstrated small focus of T1 hyperintensity within the left
caudate, which may reflect subacute blood products.
IMPRESSION: MRA NECK:

1. Diffusely narrowed appearance of the cervical internal carotid
arteries beyond the carotid bulbs bilaterally, which may reflect
sequela of dissection. 3 mm vascular protrusions of the distal
cervical ICAs bilaterally suspicious for pseudoaneurysms.
2. Fusiform dilation of the left vertebral artery within the left C2
transverse foramen, likely reflecting pseudoaneurysm.
3. Mild focal irregularity of the right vertebral artery at the C2
level, nonspecific but possibly posttraumatic.

MRA HEAD:

1. The internal carotid arteries remain somewhat narrowed in
appearance intracranially, but patent.
2. No proximal branch occlusion or high-grade proximal stenosis
elsewhere.

ADDENDUM:
These results were called by telephone at the time of interpretation
on [DATE] at [DATE] to provider ZSCHORNACK , who verbally
acknowledged these results.

*** End of Addendum ***
FINDINGS: MRA NECK FINDINGS

The left vertebral artery arises directly from the aortic arch.
Otherwise standard aortic branching.

The right common carotid artery is patent within the neck without
stenosis. Diffusely narrowed appearance of the right ICA beyond the
carotid bulb. There is a 3 mm vascular protrusion arising from the
distal cervical ICA measuring 3 mm, just beneath the level of the
skull base suspicious for pseudoaneurysm (series 4, image 12).

The left common carotid artery is patent without stenosis. Diffusely
narrowed appearance of the cervical left ICA beyond the carotid
bulb. There is a an irregular vascular protrusion arising from the
distal left cervical ICA measuring 3 mm, just beneath the level of
the skull base suspicious for pseudoaneurysm (series 4, image 24).

The vertebral arteries are codominant and patent within the neck
bilaterally. There is mild focal irregularity of the right vertebral
artery at the C2 level, this is nonspecific but may be posttraumatic
in etiology (series 454, image 15). There is fusiform dilation of
the left vertebral artery to 6 mm within the left C2 transverse
foramen (series 4, image 34), suspicious for pseudoaneurysm.

MRA HEAD FINDINGS

The internal carotid arteries remain somewhat narrowed in appearance
intracranially, but patent. The bilateral middle and anterior
cerebral arteries are patent without proximal branch occlusion or
high-grade proximal stenosis. No intracranial aneurysm is
identified.

The intracranial vertebral arteries are patent bilaterally. The
basilar artery is patent without significant stenosis. Flow is seen
within the proximal PICA vessels bilaterally. Some flow is seen
within the proximal AICA vessels bilaterally. Flow is seen within
the proximal superior cerebellar arteries bilaterally. The posterior
cerebral arteries are patent bilaterally without significant
proximal stenosis. Posterior communicating arteries are present
bilaterally.

Redemonstrated small focus of T1 hyperintensity within the left
caudate, which may reflect subacute blood products.
IMPRESSION: MRA NECK:

1. Diffusely narrowed appearance of the cervical internal carotid
arteries beyond the carotid bulbs bilaterally, which may reflect
sequela of dissection. 3 mm vascular protrusions of the distal
cervical ICAs bilaterally suspicious for pseudoaneurysms.
2. Fusiform dilation of the left vertebral artery within the left C2
transverse foramen, likely reflecting pseudoaneurysm.
3. Mild focal irregularity of the right vertebral artery at the C2
level, nonspecific but possibly posttraumatic.

MRA HEAD:

1. The internal carotid arteries remain somewhat narrowed in
appearance intracranially, but patent.
2. No proximal branch occlusion or high-grade proximal stenosis
elsewhere.
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
## 2019-07-12 IMAGING — DX DG CHEST 1V PORT
1 series · 1 of 1 positions shown · non-contrast
Comparison: None.

CLINICAL DATA: Intubation. Cardiac arrest.

EXAM:
PORTABLE CHEST 1 VIEW

[chest]
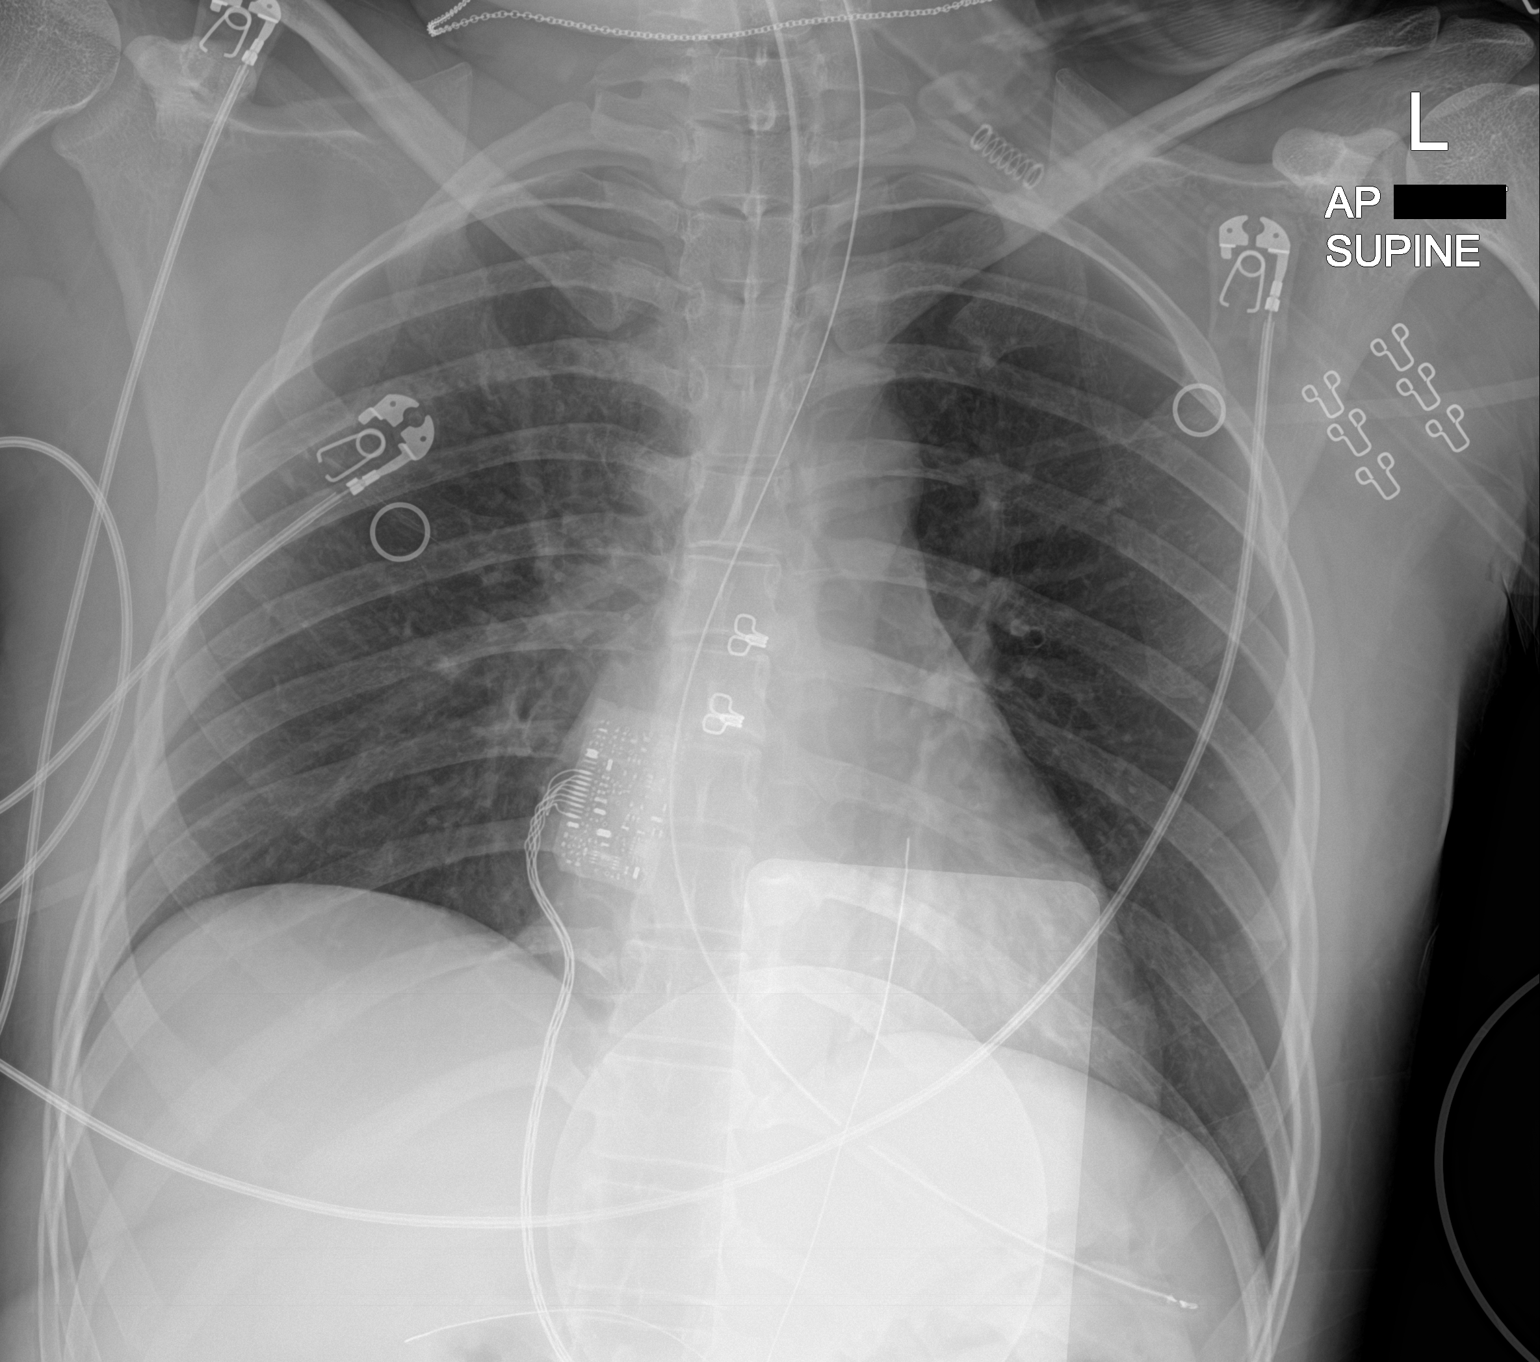

[1 of 1 positions shown; findings below may reference images not displayed]

FINDINGS: Right mainstem intubation. Recommend retraction of approximately 3
cm. Enteric tube tip and side-port below the diaphragm in the
stomach. Clear lungs. Normal cardiomediastinal contours. No
pulmonary edema, pleural effusion or pneumothorax. No acute osseous
abnormalities are seen.
IMPRESSION: Right mainstem intubation. Recommend retraction of approximately 2-3
cm.

Enteric tube tip and side-port below the diaphragm in the stomach.

These results were called by telephone at the time of interpretation
on [DATE] at [DATE] to Dr WAI FAT , who verbally
acknowledged these results.
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
## 2019-07-12 IMAGING — DX DG CHEST 1V PORT
1 series · 1 of 1 positions shown · non-contrast
Comparison: Earlier this day.

CLINICAL DATA: Central line placement.

EXAM:
PORTABLE CHEST 1 VIEW

[chest ap]
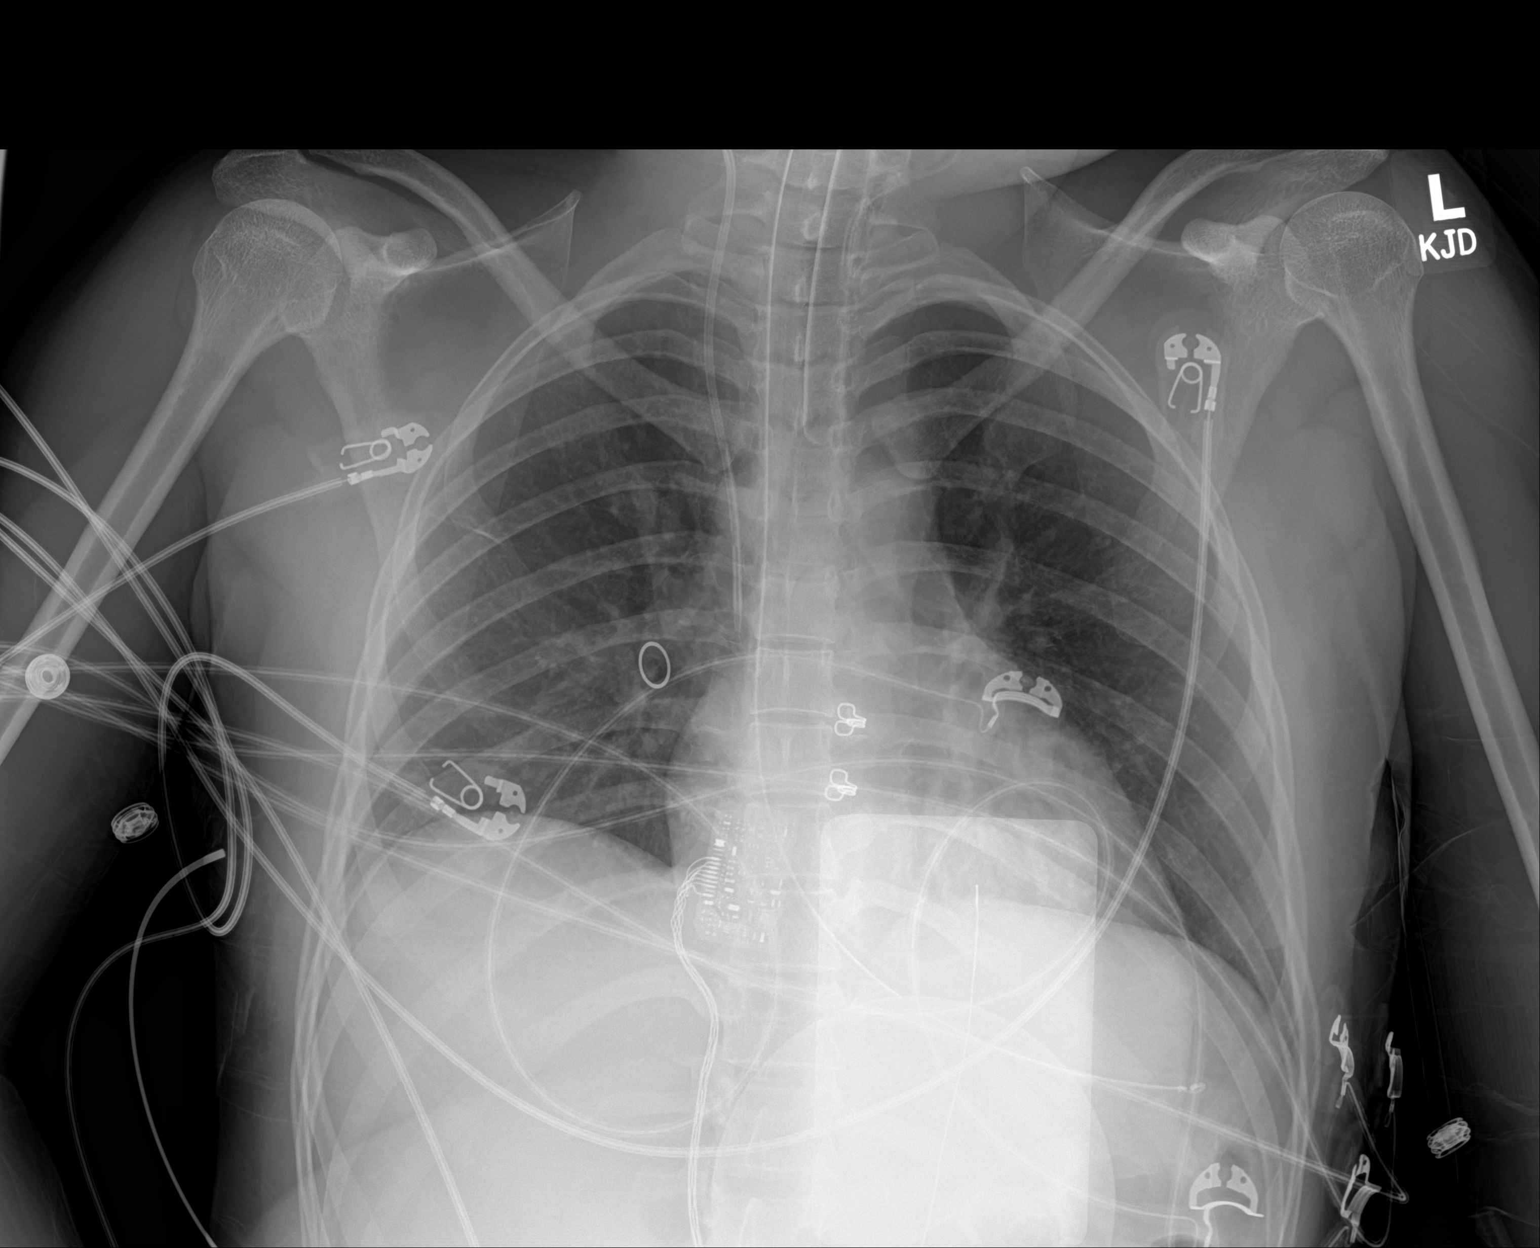

[1 of 1 positions shown; findings below may reference images not displayed]

FINDINGS: Endotracheal tube is been retracted, tip now 3.2 cm from the carina.
Enteric tube in place with tip and side-port below the diaphragm.
Right internal jugular central venous catheter tip in the mid lower
SVC. No pneumothorax. Low lung volumes. Minimal linear atelectasis
or scarring in the right mid upper lung zone. Lungs otherwise clear.
No pleural fluid. Normal heart size and mediastinal contours.
IMPRESSION: 1. Tip of the right internal jugular central venous catheter in the
mid lower SVC. No pneumothorax.
2. Endotracheal tube tip now 3.2 cm from the carina.
3. Minimal atelectasis or scarring in the right mid upper lung zone.

## 2019-07-12 MED ORDER — FENTANYL CITRATE (PF) 100 MCG/2ML IJ SOLN
INTRAMUSCULAR | Status: AC
  Filled 2019-07-12: qty 2

## 2019-07-12 MED ORDER — MIDAZOLAM HCL 2 MG/2ML IJ SOLN: 2.0000 mg | INTRAMUSCULAR | Status: DC | PRN

## 2019-07-12 MED ORDER — HEPARIN SODIUM (PORCINE) 5000 UNIT/ML IJ SOLN
5000.0000 [IU] | Freq: Three times a day (TID) | INTRAMUSCULAR | Status: DC
  Administered 2019-07-12 – 2019-07-13 (×4): 5000 [IU] via SUBCUTANEOUS
  Filled 2019-07-12 (×2): qty 1

## 2019-07-12 MED ORDER — CHLORHEXIDINE GLUCONATE 0.12% ORAL RINSE (MEDLINE KIT)
15.0000 mL | Freq: Two times a day (BID) | OROMUCOSAL | Status: DC
  Administered 2019-07-12 – 2019-07-13 (×2): 15 mL via OROMUCOSAL

## 2019-07-12 MED ORDER — ORAL CARE MOUTH RINSE
15.0000 mL | OROMUCOSAL | Status: DC
  Administered 2019-07-12 – 2019-07-13 (×8): 15 mL via OROMUCOSAL

## 2019-07-12 MED ORDER — SODIUM BICARBONATE-DEXTROSE 150-5 MEQ/L-% IV SOLN
150.0000 meq | Freq: Once | INTRAVENOUS | Status: AC
  Administered 2019-07-12: 04:00:00 150 meq via INTRAVENOUS
  Filled 2019-07-12: qty 1000

## 2019-07-12 MED ORDER — FENTANYL BOLUS VIA INFUSION
50.0000 ug | INTRAVENOUS | Status: DC | PRN
  Filled 2019-07-12: qty 50

## 2019-07-12 MED ORDER — INSULIN ASPART 100 UNIT/ML ~~LOC~~ SOLN
1.0000 [IU] | SUBCUTANEOUS | Status: DC
  Administered 2019-07-12: 1 [IU] via SUBCUTANEOUS
  Administered 2019-07-13: 2 [IU] via SUBCUTANEOUS

## 2019-07-12 MED ORDER — LACTATED RINGERS IV SOLN: INTRAVENOUS | Status: DC

## 2019-07-12 MED ORDER — PROPOFOL 1000 MG/100ML IV EMUL
INTRAVENOUS | Status: AC
  Filled 2019-07-12: qty 100

## 2019-07-12 MED ORDER — SODIUM CHLORIDE 0.9 % IV BOLUS
1000.0000 mL | Freq: Once | INTRAVENOUS | Status: AC
  Administered 2019-07-12: 1000 mL via INTRAVENOUS

## 2019-07-12 MED ORDER — NOREPINEPHRINE 4 MG/250ML-% IV SOLN
0.0000 ug/min | INTRAVENOUS | Status: DC
  Administered 2019-07-12: 15 ug/min via INTRAVENOUS
  Administered 2019-07-12: 14 ug/min via INTRAVENOUS
  Administered 2019-07-12: 5 ug/min via INTRAVENOUS
  Administered 2019-07-13: 40 ug/min via INTRAVENOUS
  Administered 2019-07-13: 14 ug/min via INTRAVENOUS
  Filled 2019-07-12: qty 250
  Filled 2019-07-12: qty 750
  Filled 2019-07-12 (×2): qty 250

## 2019-07-12 MED ORDER — VITAL HIGH PROTEIN PO LIQD
1000.0000 mL | ORAL | Status: DC
  Administered 2019-07-12: 1000 mL

## 2019-07-12 MED ORDER — CALCIUM GLUCONATE-NACL 1-0.675 GM/50ML-% IV SOLN
1.0000 g | Freq: Once | INTRAVENOUS | Status: AC
  Administered 2019-07-12: 1000 mg via INTRAVENOUS
  Filled 2019-07-12: qty 50

## 2019-07-12 MED ORDER — FENTANYL 2500MCG IN NS 250ML (10MCG/ML) PREMIX INFUSION
0.0000 ug/h | INTRAVENOUS | Status: DC
  Administered 2019-07-12: 03:00:00 50 ug/h via INTRAVENOUS
  Filled 2019-07-12: qty 250

## 2019-07-12 MED ORDER — FENTANYL CITRATE (PF) 100 MCG/2ML IJ SOLN
100.0000 ug | Freq: Once | INTRAMUSCULAR | Status: AC
  Administered 2019-07-12: 100 ug via INTRAVENOUS

## 2019-07-12 MED ORDER — SODIUM BICARBONATE-DEXTROSE 150-5 MEQ/L-% IV SOLN
150.0000 meq | INTRAVENOUS | Status: DC
  Administered 2019-07-12 – 2019-07-13 (×3): 150 meq via INTRAVENOUS
  Filled 2019-07-12 (×5): qty 1000

## 2019-07-12 MED ORDER — CALCIUM GLUCONATE-NACL 2-0.675 GM/100ML-% IV SOLN
2.0000 g | Freq: Once | INTRAVENOUS | Status: AC
  Administered 2019-07-12: 2000 mg via INTRAVENOUS
  Filled 2019-07-12: qty 100

## 2019-07-12 MED ORDER — ASPIRIN 81 MG PO CHEW
81.0000 mg | CHEWABLE_TABLET | Freq: Every day | ORAL | Status: DC
  Administered 2019-07-12: 18:00:00 81 mg via ORAL
  Filled 2019-07-12: qty 1

## 2019-07-12 MED ORDER — CHLORHEXIDINE GLUCONATE CLOTH 2 % EX PADS
6.0000 | MEDICATED_PAD | Freq: Every day | CUTANEOUS | Status: DC
  Administered 2019-07-12 – 2019-07-13 (×2): 6 via TOPICAL

## 2019-07-12 MED ORDER — SODIUM BICARBONATE-DEXTROSE 150-5 MEQ/L-% IV SOLN
150.0000 meq | INTRAVENOUS | Status: DC
  Filled 2019-07-12: qty 1000

## 2019-07-12 MED ORDER — SODIUM BICARBONATE 8.4 % IV SOLN
INTRAVENOUS | Status: AC
  Filled 2019-07-12: qty 100

## 2019-07-12 MED ORDER — ETOMIDATE 2 MG/ML IV SOLN
INTRAVENOUS | Status: AC | PRN
  Administered 2019-07-12: 20 mg via INTRAVENOUS

## 2019-07-12 MED ORDER — SODIUM ZIRCONIUM CYCLOSILICATE 10 G PO PACK
10.0000 g | PACK | ORAL | Status: AC
  Administered 2019-07-12: 10 g via ORAL
  Filled 2019-07-12: qty 1

## 2019-07-12 MED ORDER — DEXTROSE 50 % IV SOLN
1.0000 | Freq: Once | INTRAVENOUS | Status: AC
  Administered 2019-07-12: 50 mL via INTRAVENOUS
  Filled 2019-07-12: qty 50

## 2019-07-12 MED ORDER — SODIUM CHLORIDE 0.9 % IV SOLN: 1.0000 g | Freq: Once | INTRAVENOUS | Status: DC

## 2019-07-12 MED ORDER — INSULIN ASPART 100 UNIT/ML IV SOLN
10.0000 [IU] | Freq: Once | INTRAVENOUS | Status: AC
  Administered 2019-07-12: 10 [IU] via INTRAVENOUS

## 2019-07-12 MED ORDER — FENTANYL CITRATE (PF) 100 MCG/2ML IJ SOLN: 50.0000 ug | Freq: Once | INTRAMUSCULAR | Status: DC

## 2019-07-12 MED ORDER — SODIUM CHLORIDE 3 % IV SOLN: INTRAVENOUS | Status: DC

## 2019-07-12 MED ORDER — PANTOPRAZOLE SODIUM 40 MG IV SOLR
40.0000 mg | Freq: Every day | INTRAVENOUS | Status: DC
  Administered 2019-07-12: 21:00:00 40 mg via INTRAVENOUS
  Filled 2019-07-12: qty 40

## 2019-07-12 MED ORDER — LACTULOSE 10 GM/15ML PO SOLN
20.0000 g | Freq: Three times a day (TID) | ORAL | Status: DC
  Administered 2019-07-12: 20 g
  Filled 2019-07-12 (×3): qty 30

## 2019-07-12 MED ORDER — SODIUM CHLORIDE 3 % IV SOLN
INTRAVENOUS | Status: AC
  Administered 2019-07-12: 14:00:00 50 mL/h via INTRAVENOUS
  Filled 2019-07-12: qty 500

## 2019-07-12 MED ORDER — PRO-STAT SUGAR FREE PO LIQD
30.0000 mL | Freq: Two times a day (BID) | ORAL | Status: DC
  Administered 2019-07-12: 30 mL
  Filled 2019-07-12: qty 30

## 2019-07-12 MED ORDER — SUCCINYLCHOLINE CHLORIDE 20 MG/ML IJ SOLN
INTRAMUSCULAR | Status: AC | PRN
  Administered 2019-07-12: 100 mg via INTRAVENOUS

## 2019-07-12 MED ORDER — SODIUM CHLORIDE 0.9% FLUSH: 9.0000 mL | Freq: Once | INTRAVENOUS | Status: DC

## 2019-07-12 MED ORDER — FENTANYL 2500MCG IN NS 250ML (10MCG/ML) PREMIX INFUSION
50.0000 ug/h | INTRAVENOUS | Status: DC
  Administered 2019-07-12: 50 ug/h via INTRAVENOUS

## 2019-07-12 MED ORDER — SODIUM BICARBONATE 8.4 % IV SOLN
100.0000 meq | Freq: Once | INTRAVENOUS | Status: AC
  Administered 2019-07-12: 02:00:00 100 meq via INTRAVENOUS

## 2019-07-12 NOTE — ED Notes (Signed)
Lab called, CBC clotted, provider informed.  Ordered Centeral line placement.
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
## 2019-07-12 NOTE — Consult Note (Signed)
in this setting. 2. Small volume free fluid in the pelvis is associated with trace fluid around the tip of the liver and tracking into Morison's pouch. 3. Edema/hemorrhage in the left groin suggests recent vascular access procedure. 4. Dependent collapse/consolidation in the lower lobes with trace right pleural effusion. No evidence for pneumothorax. 5. Endotracheal tube tip is positioned at the carina. 6. Nonobstructive right nephrolithiasis. Electronically Signed   By: Kennith Center M.D.   On: 06/26/2019 09:01   CT CERVICAL SPINE WO CONTRAST  Result Date: 06/27/2019 CLINICAL DATA:  Unresponsive. Unknown down time. Found unresponsive in bathroom. EXAM: CT CERVICAL SPINE WITHOUT CONTRAST TECHNIQUE: Multidetector CT imaging of the cervical spine was performed without intravenous contrast. Multiplanar CT image reconstructions were also generated. COMPARISON:  None. FINDINGS: Motion artifact, especially through C2. Alignment: Straightening of normal lordosis. No traumatic subluxation. Skull base and vertebrae: No evidence of fracture allowing for motion artifact limitations. Vertebral body heights are preserved. No focal pathologic process. Soft tissues and spinal canal: No gross prevertebral soft tissue edema. No evidence of canal hematoma, allowing for motion limitations. Disc levels:  Normal. Upper chest: Lung apices are clear. Other: None. IMPRESSION: Motion limited exam. No evidence of fracture or traumatic subluxation of the cervical spine. Electronically Signed   By: Narda Rutherford M.D.   On: 07/02/2019 03:33   MR ANGIO HEAD WO CONTRAST  Addendum Date: 06/30/2019   ADDENDUM REPORT: 07/11/2019 14:20 ADDENDUM: These results were called by telephone at the time of interpretation  on 07/11/2019 at 2:19 pm to provider GRACE BOWSER , who verbally acknowledged these results. Electronically Signed   By: Jackey Loge DO   On: 06/19/2019 14:20   Result Date: 07/04/2019 CLINICAL DATA:  Stroke, follow-up. EXAM: MRA NECK without CONTRAST MRA HEAD WITHOUT CONTRAST TECHNIQUE: Multiplanar and multiecho pulse sequences of the neck were obtained without contrast. Angiographic images of the neck were obtained using MRA technique without and with intravenous contast.; Angiographic images of the Circle of Willis were obtained using MRA technique without intravenous contrast. COMPARISON:  MRI head 06/28/2019, CT head/cervical spine 07/04/2019 FINDINGS: MRA NECK FINDINGS The left vertebral artery arises directly from the aortic arch. Otherwise standard aortic branching. The right common carotid artery is patent within the neck without stenosis. Diffusely narrowed appearance of the right ICA beyond the carotid bulb. There is a 3 mm vascular protrusion arising from the distal cervical ICA measuring 3 mm, just beneath the level of the skull base suspicious for pseudoaneurysm (series 4, image 12). The left common carotid artery is patent without stenosis. Diffusely narrowed appearance of the cervical left ICA beyond the carotid bulb. There is a an irregular vascular protrusion arising from the distal left cervical ICA measuring 3 mm, just beneath the level of the skull base suspicious for pseudoaneurysm (series 4, image 24). The vertebral arteries are codominant and patent within the neck bilaterally. There is mild focal irregularity of the right vertebral artery at the C2 level, this is nonspecific but may be posttraumatic in etiology (series 454, image 15). There is fusiform dilation of the left vertebral artery to 6 mm within the left C2 transverse foramen (series 4, image 34), suspicious for pseudoaneurysm. MRA HEAD FINDINGS The internal carotid arteries remain somewhat narrowed in appearance intracranially,  but patent. The bilateral middle and anterior cerebral arteries are patent without proximal branch occlusion or high-grade proximal stenosis. No intracranial aneurysm is identified. The intracranial vertebral arteries are patent bilaterally. The basilar artery is patent without significant stenosis.  [place] @0446  07/17/2019 BY S GEZAHEGN    Total Protein 4.8 (L) 6.5 - 8.1 g/dL   Albumin 2.5 (L) 3.5 - 5.0 g/dL   AST 838 (H) 15 - 41 U/L   ALT 687 (H) 0 - 44 U/L   Alkaline Phosphatase 110 38 - 126 U/L   Total Bilirubin 0.8 0.3 - 1.2 mg/dL   GFR calc non Af Amer 18 (L) >60 mL/min   GFR calc Af Amer 20 (L) >60 mL/min   Anion gap 21 (H) 5 - 15    Comment: Performed at [facility] [address] [place], [place] [address]  Magnesium     Status: Abnormal   Collection Time: 06/23/2019  4:22 AM  Result Value Ref Range   Magnesium 2.7 (H) 1.7 - 2.4 mg/dL    Comment: Performed at [facility] [address] [place], [place] [address]  HIV Antibody (routine testing w rflx)     Status: None   Collection Time: 06/23/2019  4:22 AM  Result Value Ref Range   HIV Screen 4th Generation wRfx NON REACTIVE NON REACTIVE    Comment: Performed at [facility] [address] [place], [place] [address]  CBC     Status: Abnormal   Collection Time: 06/25/2019  4:22 AM  Result Value Ref Range   WBC 23.6 (H) 4.0 - 10.5 K/uL   RBC 4.74 3.87 - 5.11 MIL/uL   Hemoglobin 14.1 12.0 - 15.0 g/dL   HCT 46.1 (H) 36.0 - 46.0 %   MCV 97.3 80.0 - 100.0 fL   MCH 29.7 26.0 - 34.0 pg   MCHC 30.6 30.0 - 36.0 g/dL   RDW 14.3 11.5 - 15.5 %   Platelets 364 150 - 400 K/uL   nRBC 0.3 (H) 0.0 - 0.2 %    Comment: Performed at [place] [facility], [place] [address], [place], [place] [address]  Creatinine, serum     Status: Abnormal   Collection Time: 07/05/2019  4:22 AM  Result Value Ref Range   Creatinine, Ser 2.93 (H) 0.44 - 1.00 mg/dL   GFR calc non Af Amer 21 (L) >60 mL/min   GFR calc Af Amer 24 (L) >60 mL/min    Comment: Performed at [facility] [address], [place], [place] [address]  hCG, quantitative, pregnancy     Status: None   Collection Time: 06/21/2019  4:22 AM  Result Value Ref Range   hCG, Beta Chain, Quant, S 1 <5 mIU/mL    Comment:          GEST. AGE      CONC.  (mIU/mL)   <=1 WEEK        5 - 50     2 WEEKS       50 - 500     3 WEEKS       100 - 10,000     4 WEEKS     1,000 - 30,000     5 WEEKS     3,500 - 115,000   6-8 WEEKS     12,000 - 270,000    12 WEEKS     15,000 - 220,000        FEMALE AND NON-PREGNANT FEMALE:     LESS THAN 5 mIU/mL Performed at [facility], [place] [address] [place], [place] [address]   MRSA PCR Screening     Status: None   Collection Time: 07/10/2019  metabolites    1000 Barbiturate and metabolites    200 Benzodiazepine                 200 Tricyclics and metabolites     300 Opiates and metabolites        300 Cocaine and metabolites        300 THC                            50 Performed at [facility], [address], [place], [place] [address]   Troponin I (High Sensitivity)     Status: Abnormal   Collection Time: 06/28/2019  1:53 AM  Result Value Ref Range   Troponin I (High Sensitivity) 279 (HH) <18 ng/L    Comment: CRITICAL RESULT CALLED TO, READ BACK BY AND VERIFIED WITH: RN C STRAUGHAM @0313  06/22/2019 BY S GEZAHEGN (NOTE) Elevated high sensitivity troponin I (hsTnI) values and significant  changes  across serial measurements may suggest ACS but many other  chronic and acute conditions are known to elevate hsTnI results.  Refer to the Links section for chest pain algorithms and additional  guidance. Performed at [facility], [address], [place], [place] [address]   Lactic acid, plasma     Status: Abnormal   Collection Time: 06/24/2019  1:53 AM  Result Value Ref Range   Lactic Acid, Venous 9.7 (HH) 0.5 - 1.9 mmol/L    Comment: CRITICAL RESULT CALLED TO, READ BACK BY AND VERIFIED WITH: RN STRAUGHAM @0308  06/19/2019 BY S GEZAHEGN Performed at [facility], [address], [place], [place] [address]   Lipase, blood     Status: Abnormal   Collection Time: 07/15/2019  1:53 AM  Result Value Ref Range   Lipase 90 (H) 11 - 51 U/L    Comment: Performed at [facility], [address], [place], [place] [address]  Salicylate level     Status: Abnormal   Collection Time: 07/14/2019  1:53 AM  Result Value Ref Range   Salicylate Lvl <7.0 (L) 7.0 - 30.0 mg/dL    Comment: Performed at [facility], [address] [place], [place] [address]  Acetaminophen level     Status: Abnormal   Collection Time: 07/03/2019  1:53 AM  Result Value Ref Range   Acetaminophen (Tylenol), Serum <10 (L) 10 - 30 ug/mL    Comment: (NOTE) Therapeutic concentrations vary significantly. A range of 10-30 ug/mL  may be an effective concentration for many patients. However, some  are best treated at concentrations outside of this range. Acetaminophen concentrations >150 ug/mL at 4 hours after ingestion  and >50 ug/mL at 12 hours after ingestion are often associated with  toxic reactions. Performed at [facility], [address], [place], [place] [address]   Ammonia     Status: Abnormal   Collection Time: 07/04/2019  1:53 AM  Result Value Ref Range   Ammonia 215 (H) 9 - 35 umol/L    Comment: Performed at [facility], [address], [place], [place] [address]  Ethanol     Status:  None   Collection Time: 07/11/2019  1:53 AM  Result Value Ref Range   Alcohol, Ethyl (B) <10 <10 mg/dL    Comment: (NOTE) Lowest detectable limit for serum alcohol is 10 mg/dL. For medical purposes only. Performed at [facility], [address] [place], [place]  in this setting. 2. Small volume free fluid in the pelvis is associated with trace fluid around the tip of the liver and tracking into Morison's pouch. 3. Edema/hemorrhage in the left groin suggests recent vascular access procedure. 4. Dependent collapse/consolidation in the lower lobes with trace right pleural effusion. No evidence for pneumothorax. 5. Endotracheal tube tip is positioned at the carina. 6. Nonobstructive right nephrolithiasis. Electronically Signed   By: Kennith Center M.D.   On: 06/26/2019 09:01   CT CERVICAL SPINE WO CONTRAST  Result Date: 06/27/2019 CLINICAL DATA:  Unresponsive. Unknown down time. Found unresponsive in bathroom. EXAM: CT CERVICAL SPINE WITHOUT CONTRAST TECHNIQUE: Multidetector CT imaging of the cervical spine was performed without intravenous contrast. Multiplanar CT image reconstructions were also generated. COMPARISON:  None. FINDINGS: Motion artifact, especially through C2. Alignment: Straightening of normal lordosis. No traumatic subluxation. Skull base and vertebrae: No evidence of fracture allowing for motion artifact limitations. Vertebral body heights are preserved. No focal pathologic process. Soft tissues and spinal canal: No gross prevertebral soft tissue edema. No evidence of canal hematoma, allowing for motion limitations. Disc levels:  Normal. Upper chest: Lung apices are clear. Other: None. IMPRESSION: Motion limited exam. No evidence of fracture or traumatic subluxation of the cervical spine. Electronically Signed   By: Narda Rutherford M.D.   On: 07/02/2019 03:33   MR ANGIO HEAD WO CONTRAST  Addendum Date: 06/30/2019   ADDENDUM REPORT: 07/11/2019 14:20 ADDENDUM: These results were called by telephone at the time of interpretation  on 07/11/2019 at 2:19 pm to provider GRACE BOWSER , who verbally acknowledged these results. Electronically Signed   By: Jackey Loge DO   On: 06/19/2019 14:20   Result Date: 07/04/2019 CLINICAL DATA:  Stroke, follow-up. EXAM: MRA NECK without CONTRAST MRA HEAD WITHOUT CONTRAST TECHNIQUE: Multiplanar and multiecho pulse sequences of the neck were obtained without contrast. Angiographic images of the neck were obtained using MRA technique without and with intravenous contast.; Angiographic images of the Circle of Willis were obtained using MRA technique without intravenous contrast. COMPARISON:  MRI head 06/28/2019, CT head/cervical spine 07/04/2019 FINDINGS: MRA NECK FINDINGS The left vertebral artery arises directly from the aortic arch. Otherwise standard aortic branching. The right common carotid artery is patent within the neck without stenosis. Diffusely narrowed appearance of the right ICA beyond the carotid bulb. There is a 3 mm vascular protrusion arising from the distal cervical ICA measuring 3 mm, just beneath the level of the skull base suspicious for pseudoaneurysm (series 4, image 12). The left common carotid artery is patent without stenosis. Diffusely narrowed appearance of the cervical left ICA beyond the carotid bulb. There is a an irregular vascular protrusion arising from the distal left cervical ICA measuring 3 mm, just beneath the level of the skull base suspicious for pseudoaneurysm (series 4, image 24). The vertebral arteries are codominant and patent within the neck bilaterally. There is mild focal irregularity of the right vertebral artery at the C2 level, this is nonspecific but may be posttraumatic in etiology (series 454, image 15). There is fusiform dilation of the left vertebral artery to 6 mm within the left C2 transverse foramen (series 4, image 34), suspicious for pseudoaneurysm. MRA HEAD FINDINGS The internal carotid arteries remain somewhat narrowed in appearance intracranially,  but patent. The bilateral middle and anterior cerebral arteries are patent without proximal branch occlusion or high-grade proximal stenosis. No intracranial aneurysm is identified. The intracranial vertebral arteries are patent bilaterally. The basilar artery is patent without significant stenosis.  Chief Complaint   Chief Complaint  Patient presents with  . Cardiac Arrest    HPI   Consult requested by: Dr Drucie Ip, PCCM [facility] Reason for consult: cerebellar stroke  HPI: Jenny Myers is a 29 y.o. 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 Patient in PEA arrest upon EMS arrival, ROSC achieved in 4 minutes. Details surrounding the event are rather unclear/not known. There is report that patient called friend at 0100 today to come pick her up. When friend arrived patient was not there, but her iphone was. EMS called to house where patient was found. Concern initially of OD (cocacine and THC +) however, concern for trauma given significant bruising. Patient was reportedly "popping pills all night".  She underwent work up which included MRI/MRA brain. 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 NSY consultation requested for consideration of craniectomy.    Patient Active Problem List   Diagnosis Date Noted  . Cardiac arrest (HCC) 08-11-19  . Acute respiratory failure (HCC)   . Endotracheally intubated   . Hypoxic ischemic encephalopathy   . Cytotoxic cerebral edema (HCC)   . Subgaleal hemorrhage     PMH: Past Medical History:  Diagnosis Date  . ACS (acute coronary syndrome) (HCC)   . STEMI (ST elevation myocardial infarction) (HCC)     PSH: Past Surgical History:  Procedure Laterality Date  . CARDIAC CATHETERIZATION      No medications prior to admission.    SH: Social History   Tobacco Use  . Smoking status: Not on file  Substance Use Topics  . Alcohol use: Not on file  . Drug use: Yes    Types: Marijuana    MEDS: Prior to Admission medications   Not on File    ALLERGY: Allergies  Allergen Reactions  . Garlic   . Penicillins     Social History    Tobacco Use  . Smoking status: Not on file  Substance Use Topics  . Alcohol use: Not on file     History reviewed. No pertinent family history.   ROS   ROS intubated  Exam   Vitals:   August 11, 2019 1318 08/11/19 1529  BP: (!) 149/44 (!) 144/82  Pulse: 89 99  Resp: (!) 22 (!) 22  Temp:    SpO2: 100% 100%   Intubated NO sedation running Facial edema noted Pupils 2mm, R nonreactive, L sluggishly reactive No corneal, minimal gag No response to central pain  Results - Imaging/Labs   Results for orders placed or performed during the hospital encounter of 08/11/19 (from the past 48 hour(s))  Rapid urine drug screen (hospital performed)     Status: Abnormal   Collection Time: 11-Aug-2019  1:42 AM  Result Value Ref Range   Opiates NONE DETECTED NONE DETECTED   Cocaine POSITIVE (A) NONE DETECTED   Benzodiazepines NONE DETECTED NONE DETECTED   Amphetamines NONE DETECTED NONE DETECTED   Tetrahydrocannabinol POSITIVE (A) NONE DETECTED   Barbiturates NONE DETECTED NONE DETECTED    Comment: (NOTE) DRUG SCREEN FOR MEDICAL PURPOSES ONLY.  IF CONFIRMATION IS NEEDED FOR ANY PURPOSE, NOTIFY LAB WITHIN 5 DAYS. LOWEST DETECTABLE LIMITS FOR URINE DRUG SCREEN Drug Class                     Cutoff (ng/mL) Amphetamine and  [place] @0446  07/17/2019 BY S GEZAHEGN    Total Protein 4.8 (L) 6.5 - 8.1 g/dL   Albumin 2.5 (L) 3.5 - 5.0 g/dL   AST 838 (H) 15 - 41 U/L   ALT 687 (H) 0 - 44 U/L   Alkaline Phosphatase 110 38 - 126 U/L   Total Bilirubin 0.8 0.3 - 1.2 mg/dL   GFR calc non Af Amer 18 (L) >60 mL/min   GFR calc Af Amer 20 (L) >60 mL/min   Anion gap 21 (H) 5 - 15    Comment: Performed at [facility] [address] [place], [place] [address]  Magnesium     Status: Abnormal   Collection Time: 06/23/2019  4:22 AM  Result Value Ref Range   Magnesium 2.7 (H) 1.7 - 2.4 mg/dL    Comment: Performed at [facility] [address] [place], [place] [address]  HIV Antibody (routine testing w rflx)     Status: None   Collection Time: 06/23/2019  4:22 AM  Result Value Ref Range   HIV Screen 4th Generation wRfx NON REACTIVE NON REACTIVE    Comment: Performed at [facility] [address] [place], [place] [address]  CBC     Status: Abnormal   Collection Time: 06/25/2019  4:22 AM  Result Value Ref Range   WBC 23.6 (H) 4.0 - 10.5 K/uL   RBC 4.74 3.87 - 5.11 MIL/uL   Hemoglobin 14.1 12.0 - 15.0 g/dL   HCT 46.1 (H) 36.0 - 46.0 %   MCV 97.3 80.0 - 100.0 fL   MCH 29.7 26.0 - 34.0 pg   MCHC 30.6 30.0 - 36.0 g/dL   RDW 14.3 11.5 - 15.5 %   Platelets 364 150 - 400 K/uL   nRBC 0.3 (H) 0.0 - 0.2 %    Comment: Performed at [place] [facility], [place] [address], [place], [place] [address]  Creatinine, serum     Status: Abnormal   Collection Time: 07/05/2019  4:22 AM  Result Value Ref Range   Creatinine, Ser 2.93 (H) 0.44 - 1.00 mg/dL   GFR calc non Af Amer 21 (L) >60 mL/min   GFR calc Af Amer 24 (L) >60 mL/min    Comment: Performed at [facility] [address], [place], [place] [address]  hCG, quantitative, pregnancy     Status: None   Collection Time: 06/21/2019  4:22 AM  Result Value Ref Range   hCG, Beta Chain, Quant, S 1 <5 mIU/mL    Comment:          GEST. AGE      CONC.  (mIU/mL)   <=1 WEEK        5 - 50     2 WEEKS       50 - 500     3 WEEKS       100 - 10,000     4 WEEKS     1,000 - 30,000     5 WEEKS     3,500 - 115,000   6-8 WEEKS     12,000 - 270,000    12 WEEKS     15,000 - 220,000        FEMALE AND NON-PREGNANT FEMALE:     LESS THAN 5 mIU/mL Performed at [facility], [place] [address] [place], [place] [address]   MRSA PCR Screening     Status: None   Collection Time: 07/10/2019  [place] @0446  07/17/2019 BY S GEZAHEGN    Total Protein 4.8 (L) 6.5 - 8.1 g/dL   Albumin 2.5 (L) 3.5 - 5.0 g/dL   AST 838 (H) 15 - 41 U/L   ALT 687 (H) 0 - 44 U/L   Alkaline Phosphatase 110 38 - 126 U/L   Total Bilirubin 0.8 0.3 - 1.2 mg/dL   GFR calc non Af Amer 18 (L) >60 mL/min   GFR calc Af Amer 20 (L) >60 mL/min   Anion gap 21 (H) 5 - 15    Comment: Performed at [facility] [address] [place], [place] [address]  Magnesium     Status: Abnormal   Collection Time: 06/23/2019  4:22 AM  Result Value Ref Range   Magnesium 2.7 (H) 1.7 - 2.4 mg/dL    Comment: Performed at [facility] [address] [place], [place] [address]  HIV Antibody (routine testing w rflx)     Status: None   Collection Time: 06/23/2019  4:22 AM  Result Value Ref Range   HIV Screen 4th Generation wRfx NON REACTIVE NON REACTIVE    Comment: Performed at [facility] [address] [place], [place] [address]  CBC     Status: Abnormal   Collection Time: 06/25/2019  4:22 AM  Result Value Ref Range   WBC 23.6 (H) 4.0 - 10.5 K/uL   RBC 4.74 3.87 - 5.11 MIL/uL   Hemoglobin 14.1 12.0 - 15.0 g/dL   HCT 46.1 (H) 36.0 - 46.0 %   MCV 97.3 80.0 - 100.0 fL   MCH 29.7 26.0 - 34.0 pg   MCHC 30.6 30.0 - 36.0 g/dL   RDW 14.3 11.5 - 15.5 %   Platelets 364 150 - 400 K/uL   nRBC 0.3 (H) 0.0 - 0.2 %    Comment: Performed at [place] [facility], [place] [address], [place], [place] [address]  Creatinine, serum     Status: Abnormal   Collection Time: 07/05/2019  4:22 AM  Result Value Ref Range   Creatinine, Ser 2.93 (H) 0.44 - 1.00 mg/dL   GFR calc non Af Amer 21 (L) >60 mL/min   GFR calc Af Amer 24 (L) >60 mL/min    Comment: Performed at [facility] [address], [place], [place] [address]  hCG, quantitative, pregnancy     Status: None   Collection Time: 06/21/2019  4:22 AM  Result Value Ref Range   hCG, Beta Chain, Quant, S 1 <5 mIU/mL    Comment:          GEST. AGE      CONC.  (mIU/mL)   <=1 WEEK        5 - 50     2 WEEKS       50 - 500     3 WEEKS       100 - 10,000     4 WEEKS     1,000 - 30,000     5 WEEKS     3,500 - 115,000   6-8 WEEKS     12,000 - 270,000    12 WEEKS     15,000 - 220,000        FEMALE AND NON-PREGNANT FEMALE:     LESS THAN 5 mIU/mL Performed at [facility], [place] [address] [place], [place] [address]   MRSA PCR Screening     Status: None   Collection Time: 07/10/2019  Orbits: No acute orbital fracture. Both orbits and globes are intact. Sinuses: No sinus fracture or fluid level. Small mucous retention cyst in the right maxillary sinus. Mastoid air cells are clear. Soft tissues: Negative. Limited intracranial: Assessed on concurrent head CT, reported separately. IMPRESSION: Nondisplaced left nasal fracture, age indeterminate. No other facial bone fracture. Electronically Signed   By: Narda Rutherford M.D.   On: 08/08/2019 03:36   Impression/Plan   29 y.o. female who was found to have severe ischemic brain injury with areas of cytotoxic edemia involving right cerebellum, right occipital lobe, both internal capsules and throughout much of the supratentorial white matter after being found down for unknown length of time. Etiology is rather unclear. She has essentially no neurologic exam.  Imaging reviewed with Dr Conchita Paris. No plan for craniectomy for decompression. Will repeat head CT at 6am. Should there be signs of worsening increased ICP, we will proceed with craniectomy.  Cindra Presume, PA-C [facility]  Flow is seen within the proximal PICA vessels bilaterally. Some flow is seen within the proximal AICA vessels bilaterally. Flow is seen within the proximal superior cerebellar arteries bilaterally. The posterior cerebral arteries are patent bilaterally without significant proximal stenosis. Posterior communicating arteries are present bilaterally. Redemonstrated small focus of T1 hyperintensity within the left caudate, which may reflect subacute blood products. IMPRESSION: MRA NECK: 1. Diffusely narrowed appearance of the cervical internal carotid arteries beyond the carotid bulbs bilaterally, which may reflect sequela of dissection. 3 mm vascular protrusions of the distal cervical ICAs bilaterally suspicious for pseudoaneurysms. 2. 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 3. Mild focal irregularity of the right vertebral artery at the C2 level, nonspecific but possibly posttraumatic. MRA HEAD: 1. The internal carotid arteries remain somewhat narrowed in appearance intracranially, but patent. 2. No proximal branch occlusion or high-grade proximal stenosis elsewhere. Electronically Signed: By: Jackey Loge DO On: 06/30/2019 14:14   MR ANGIO NECK WO CONTRAST  Addendum Date: 07/08/2019   ADDENDUM REPORT: 06/24/2019 14:20 ADDENDUM: These results were called by telephone at the time of interpretation on 06/22/2019 at 2:19 pm to provider GRACE BOWSER , who verbally acknowledged these results. Electronically Signed   By: Jackey Loge DO   On: 07/03/2019 14:20   Result Date: 06/19/2019 CLINICAL DATA:  Stroke, follow-up. EXAM: MRA NECK without  CONTRAST MRA HEAD WITHOUT CONTRAST TECHNIQUE: Multiplanar and multiecho pulse sequences of the neck were obtained without contrast. Angiographic images of the neck were obtained using MRA technique without and with intravenous contast.; Angiographic images of the Circle of Willis were obtained using MRA technique without intravenous contrast. COMPARISON:  MRI head 06/30/2019, CT head/cervical spine 06/20/2019 FINDINGS: MRA NECK FINDINGS The left vertebral artery arises directly from the aortic arch. Otherwise standard aortic branching. The right common carotid artery is patent within the neck without stenosis. Diffusely narrowed appearance of the right ICA beyond the carotid bulb. There is a 3 mm vascular protrusion arising from the distal cervical ICA measuring 3 mm, just beneath the level of the skull base suspicious for pseudoaneurysm (series 4, image 12). The left common carotid artery is patent without stenosis. Diffusely narrowed appearance of the cervical left ICA beyond the carotid bulb. There is a an irregular vascular protrusion arising from the distal left cervical ICA measuring 3 mm, just beneath the level of the skull base suspicious for pseudoaneurysm (series 4, image 24). The vertebral arteries are codominant and patent within the neck bilaterally. There is mild focal irregularity of the right vertebral artery at the C2 level, this is nonspecific but may be posttraumatic in etiology (series 454, image 15). There is fusiform dilation of the left vertebral artery to 6 mm within the left C2 transverse foramen (series 4, image 34), suspicious for pseudoaneurysm. MRA HEAD FINDINGS The internal carotid arteries remain somewhat narrowed in appearance intracranially, but patent. The bilateral middle and anterior cerebral arteries are patent without proximal branch occlusion or high-grade proximal stenosis. No intracranial aneurysm is identified. The intracranial vertebral arteries are patent bilaterally. The  basilar artery is patent without significant stenosis. Flow is seen within the proximal PICA vessels bilaterally. Some flow is seen within the proximal AICA vessels bilaterally. Flow is seen within the proximal superior cerebellar arteries bilaterally. The posterior cerebral arteries are patent bilaterally without significant proximal stenosis. Posterior communicating arteries are present bilaterally. Redemonstrated small focus of T1 hyperintensity within the left caudate, which may reflect subacute blood  products. IMPRESSION: MRA NECK: 1. Diffusely narrowed appearance of the cervical internal carotid arteries beyond the carotid bulbs bilaterally, which may reflect sequela of dissection. 3 mm vascular protrusions of the distal cervical ICAs bilaterally suspicious for pseudoaneurysms. 2. 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 3. Mild focal irregularity of the right vertebral artery at the C2 level, nonspecific but possibly posttraumatic. MRA HEAD: 1. The internal carotid arteries remain somewhat narrowed in appearance intracranially, but patent. 2. No proximal branch occlusion or high-grade proximal stenosis elsewhere. Electronically Signed: By: Jackey Loge DO On: 07/07/2019 14:14   MR BRAIN WO CONTRAST  Result Date: 07/19/2019 CLINICAL DATA:  Cardiac arrest. Found down. EXAM: MRI HEAD WITHOUT CONTRAST TECHNIQUE: Multiplanar, multiecho pulse sequences of the brain and surrounding structures were obtained without intravenous contrast. COMPARISON:  None. FINDINGS: Brain: There is abnormal diffusion restriction along both internal capsules, within the subcortical white matter of both hemispheres and with incomplete regions of the right occipital lobe and right cerebellum. There is some petechial blood at the right occipital lobe site. There is edema of the right cerebellar hemisphere with narrowing of the fourth ventricle. No  transtentorial herniation or tonsillar herniation. Vascular: Normal flow voids Skull and upper cervical spine: Subgaleal hematoma over much of the scalp. Sinuses/Orbits: Negative. Other: None. IMPRESSION: 1. Severe hypoxic ischemic injury with areas of cytotoxic edema within the right cerebellum, right occipital lobe, both internal capsules and throughout much of the supratentorial white matter. 2. Edema of the right cerebellar hemisphere with narrowing of the fourth ventricle but no hydrocephalus or herniation. 3. Petechial blood within the right occipital lobe. 4. Subgaleal hematoma over the scalp. Electronically Signed   By: Deatra Robinson M.D.   On: 07/05/2019 06:03   MR CERVICAL SPINE WO CONTRAST  Result Date: 07/09/2019 CLINICAL DATA:  Neck trauma, abnormal mental status or neuro exam. EXAM: MRI CERVICAL SPINE WITHOUT CONTRAST TECHNIQUE: Multiplanar, multisequence MR imaging of the cervical spine was performed. No intravenous contrast was administered. COMPARISON:  CT cervical spine 06/19/2019 FINDINGS: Alignment: Reversal of the expected cervical lordosis. No significant spondylolisthesis. Vertebrae: Vertebral body height is maintained. No significant marrow edema to suggest occult cervical spine fracture. Cord: No spinal cord signal abnormality Posterior Fossa, vertebral arteries, paraspinal tissues: Redemonstrated right cerebellar acute infarct. Vertebral arteries better assessed on concurrent MRA neck. There is significant soft tissue and intramuscular edema within bilateral neck (greater on the right). This includes prominent edema throughout the right sternocleidomastoid muscle. A edema also extends into the parapharyngeal spaces bilaterally. There is more focal edema within the dorsal paraspinal musculature along the left C4 lamina (series 8, image 12). There is prevertebral edema versus hematoma extending from the skull base to the C6 level, measuring up to 8 mm in AP dimension. Partially visualized  support tubes. Partially visualized support tubes. Disc levels: Mild-to-moderate disc degeneration greatest at C4-C5 and C5-C6. C2-C3: No disc herniation. No significant canal or foraminal stenosis. C3-C4: No disc herniation. No significant canal or foraminal stenosis. C4-C5: Minimal disc bulge. No significant spinal canal or neural foraminal narrowing. C5-C6: Minimal disc bulge and right-sided uncinate hypertrophy. No significant spinal canal or neural foraminal narrowing. C6-C7: No disc herniation. No significant canal or foraminal stenosis. C7-T1: No disc herniation. No significant canal or foraminal stenosis. IMPRESSION: Mild cervical spondylosis without significant spinal canal or neural foraminal narrowing at any level. No evidence of spinal cord signal abnormality or epidural hematoma. Marked nonspecific soft tissue and  in this setting. 2. Small volume free fluid in the pelvis is associated with trace fluid around the tip of the liver and tracking into Morison's pouch. 3. Edema/hemorrhage in the left groin suggests recent vascular access procedure. 4. Dependent collapse/consolidation in the lower lobes with trace right pleural effusion. No evidence for pneumothorax. 5. Endotracheal tube tip is positioned at the carina. 6. Nonobstructive right nephrolithiasis. Electronically Signed   By: Kennith Center M.D.   On: 06/26/2019 09:01   CT CERVICAL SPINE WO CONTRAST  Result Date: 06/27/2019 CLINICAL DATA:  Unresponsive. Unknown down time. Found unresponsive in bathroom. EXAM: CT CERVICAL SPINE WITHOUT CONTRAST TECHNIQUE: Multidetector CT imaging of the cervical spine was performed without intravenous contrast. Multiplanar CT image reconstructions were also generated. COMPARISON:  None. FINDINGS: Motion artifact, especially through C2. Alignment: Straightening of normal lordosis. No traumatic subluxation. Skull base and vertebrae: No evidence of fracture allowing for motion artifact limitations. Vertebral body heights are preserved. No focal pathologic process. Soft tissues and spinal canal: No gross prevertebral soft tissue edema. No evidence of canal hematoma, allowing for motion limitations. Disc levels:  Normal. Upper chest: Lung apices are clear. Other: None. IMPRESSION: Motion limited exam. No evidence of fracture or traumatic subluxation of the cervical spine. Electronically Signed   By: Narda Rutherford M.D.   On: 07/02/2019 03:33   MR ANGIO HEAD WO CONTRAST  Addendum Date: 06/30/2019   ADDENDUM REPORT: 07/11/2019 14:20 ADDENDUM: These results were called by telephone at the time of interpretation  on 07/11/2019 at 2:19 pm to provider GRACE BOWSER , who verbally acknowledged these results. Electronically Signed   By: Jackey Loge DO   On: 06/19/2019 14:20   Result Date: 07/04/2019 CLINICAL DATA:  Stroke, follow-up. EXAM: MRA NECK without CONTRAST MRA HEAD WITHOUT CONTRAST TECHNIQUE: Multiplanar and multiecho pulse sequences of the neck were obtained without contrast. Angiographic images of the neck were obtained using MRA technique without and with intravenous contast.; Angiographic images of the Circle of Willis were obtained using MRA technique without intravenous contrast. COMPARISON:  MRI head 06/28/2019, CT head/cervical spine 07/04/2019 FINDINGS: MRA NECK FINDINGS The left vertebral artery arises directly from the aortic arch. Otherwise standard aortic branching. The right common carotid artery is patent within the neck without stenosis. Diffusely narrowed appearance of the right ICA beyond the carotid bulb. There is a 3 mm vascular protrusion arising from the distal cervical ICA measuring 3 mm, just beneath the level of the skull base suspicious for pseudoaneurysm (series 4, image 12). The left common carotid artery is patent without stenosis. Diffusely narrowed appearance of the cervical left ICA beyond the carotid bulb. There is a an irregular vascular protrusion arising from the distal left cervical ICA measuring 3 mm, just beneath the level of the skull base suspicious for pseudoaneurysm (series 4, image 24). The vertebral arteries are codominant and patent within the neck bilaterally. There is mild focal irregularity of the right vertebral artery at the C2 level, this is nonspecific but may be posttraumatic in etiology (series 454, image 15). There is fusiform dilation of the left vertebral artery to 6 mm within the left C2 transverse foramen (series 4, image 34), suspicious for pseudoaneurysm. MRA HEAD FINDINGS The internal carotid arteries remain somewhat narrowed in appearance intracranially,  but patent. The bilateral middle and anterior cerebral arteries are patent without proximal branch occlusion or high-grade proximal stenosis. No intracranial aneurysm is identified. The intracranial vertebral arteries are patent bilaterally. The basilar artery is patent without significant stenosis.  products. IMPRESSION: MRA NECK: 1. Diffusely narrowed appearance of the cervical internal carotid arteries beyond the carotid bulbs bilaterally, which may reflect sequela of dissection. 3 mm vascular protrusions of the distal cervical ICAs bilaterally suspicious for pseudoaneurysms. 2. 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 3. Mild focal irregularity of the right vertebral artery at the C2 level, nonspecific but possibly posttraumatic. MRA HEAD: 1. The internal carotid arteries remain somewhat narrowed in appearance intracranially, but patent. 2. No proximal branch occlusion or high-grade proximal stenosis elsewhere. Electronically Signed: By: Jackey Loge DO On: 07/07/2019 14:14   MR BRAIN WO CONTRAST  Result Date: 07/19/2019 CLINICAL DATA:  Cardiac arrest. Found down. EXAM: MRI HEAD WITHOUT CONTRAST TECHNIQUE: Multiplanar, multiecho pulse sequences of the brain and surrounding structures were obtained without intravenous contrast. COMPARISON:  None. FINDINGS: Brain: There is abnormal diffusion restriction along both internal capsules, within the subcortical white matter of both hemispheres and with incomplete regions of the right occipital lobe and right cerebellum. There is some petechial blood at the right occipital lobe site. There is edema of the right cerebellar hemisphere with narrowing of the fourth ventricle. No  transtentorial herniation or tonsillar herniation. Vascular: Normal flow voids Skull and upper cervical spine: Subgaleal hematoma over much of the scalp. Sinuses/Orbits: Negative. Other: None. IMPRESSION: 1. Severe hypoxic ischemic injury with areas of cytotoxic edema within the right cerebellum, right occipital lobe, both internal capsules and throughout much of the supratentorial white matter. 2. Edema of the right cerebellar hemisphere with narrowing of the fourth ventricle but no hydrocephalus or herniation. 3. Petechial blood within the right occipital lobe. 4. Subgaleal hematoma over the scalp. Electronically Signed   By: Deatra Robinson M.D.   On: 07/05/2019 06:03   MR CERVICAL SPINE WO CONTRAST  Result Date: 07/09/2019 CLINICAL DATA:  Neck trauma, abnormal mental status or neuro exam. EXAM: MRI CERVICAL SPINE WITHOUT CONTRAST TECHNIQUE: Multiplanar, multisequence MR imaging of the cervical spine was performed. No intravenous contrast was administered. COMPARISON:  CT cervical spine 06/19/2019 FINDINGS: Alignment: Reversal of the expected cervical lordosis. No significant spondylolisthesis. Vertebrae: Vertebral body height is maintained. No significant marrow edema to suggest occult cervical spine fracture. Cord: No spinal cord signal abnormality Posterior Fossa, vertebral arteries, paraspinal tissues: Redemonstrated right cerebellar acute infarct. Vertebral arteries better assessed on concurrent MRA neck. There is significant soft tissue and intramuscular edema within bilateral neck (greater on the right). This includes prominent edema throughout the right sternocleidomastoid muscle. A edema also extends into the parapharyngeal spaces bilaterally. There is more focal edema within the dorsal paraspinal musculature along the left C4 lamina (series 8, image 12). There is prevertebral edema versus hematoma extending from the skull base to the C6 level, measuring up to 8 mm in AP dimension. Partially visualized  support tubes. Partially visualized support tubes. Disc levels: Mild-to-moderate disc degeneration greatest at C4-C5 and C5-C6. C2-C3: No disc herniation. No significant canal or foraminal stenosis. C3-C4: No disc herniation. No significant canal or foraminal stenosis. C4-C5: Minimal disc bulge. No significant spinal canal or neural foraminal narrowing. C5-C6: Minimal disc bulge and right-sided uncinate hypertrophy. No significant spinal canal or neural foraminal narrowing. C6-C7: No disc herniation. No significant canal or foraminal stenosis. C7-T1: No disc herniation. No significant canal or foraminal stenosis. IMPRESSION: Mild cervical spondylosis without significant spinal canal or neural foraminal narrowing at any level. No evidence of spinal cord signal abnormality or epidural hematoma. Marked nonspecific soft tissue and  Orbits: No acute orbital fracture. Both orbits and globes are intact. Sinuses: No sinus fracture or fluid level. Small mucous retention cyst in the right maxillary sinus. Mastoid air cells are clear. Soft tissues: Negative. Limited intracranial: Assessed on concurrent head CT, reported separately. IMPRESSION: Nondisplaced left nasal fracture, age indeterminate. No other facial bone fracture. Electronically Signed   By: Narda Rutherford M.D.   On: 08/08/2019 03:36   Impression/Plan   29 y.o. female who was found to have severe ischemic brain injury with areas of cytotoxic edemia involving right cerebellum, right occipital lobe, both internal capsules and throughout much of the supratentorial white matter after being found down for unknown length of time. Etiology is rather unclear. She has essentially no neurologic exam.  Imaging reviewed with Dr Conchita Paris. No plan for craniectomy for decompression. Will repeat head CT at 6am. Should there be signs of worsening increased ICP, we will proceed with craniectomy.  Cindra Presume, PA-C [facility]  products. IMPRESSION: MRA NECK: 1. Diffusely narrowed appearance of the cervical internal carotid arteries beyond the carotid bulbs bilaterally, which may reflect sequela of dissection. 3 mm vascular protrusions of the distal cervical ICAs bilaterally suspicious for pseudoaneurysms. 2. 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 3. Mild focal irregularity of the right vertebral artery at the C2 level, nonspecific but possibly posttraumatic. MRA HEAD: 1. The internal carotid arteries remain somewhat narrowed in appearance intracranially, but patent. 2. No proximal branch occlusion or high-grade proximal stenosis elsewhere. Electronically Signed: By: Jackey Loge DO On: 07/07/2019 14:14   MR BRAIN WO CONTRAST  Result Date: 07/19/2019 CLINICAL DATA:  Cardiac arrest. Found down. EXAM: MRI HEAD WITHOUT CONTRAST TECHNIQUE: Multiplanar, multiecho pulse sequences of the brain and surrounding structures were obtained without intravenous contrast. COMPARISON:  None. FINDINGS: Brain: There is abnormal diffusion restriction along both internal capsules, within the subcortical white matter of both hemispheres and with incomplete regions of the right occipital lobe and right cerebellum. There is some petechial blood at the right occipital lobe site. There is edema of the right cerebellar hemisphere with narrowing of the fourth ventricle. No  transtentorial herniation or tonsillar herniation. Vascular: Normal flow voids Skull and upper cervical spine: Subgaleal hematoma over much of the scalp. Sinuses/Orbits: Negative. Other: None. IMPRESSION: 1. Severe hypoxic ischemic injury with areas of cytotoxic edema within the right cerebellum, right occipital lobe, both internal capsules and throughout much of the supratentorial white matter. 2. Edema of the right cerebellar hemisphere with narrowing of the fourth ventricle but no hydrocephalus or herniation. 3. Petechial blood within the right occipital lobe. 4. Subgaleal hematoma over the scalp. Electronically Signed   By: Deatra Robinson M.D.   On: 07/05/2019 06:03   MR CERVICAL SPINE WO CONTRAST  Result Date: 07/09/2019 CLINICAL DATA:  Neck trauma, abnormal mental status or neuro exam. EXAM: MRI CERVICAL SPINE WITHOUT CONTRAST TECHNIQUE: Multiplanar, multisequence MR imaging of the cervical spine was performed. No intravenous contrast was administered. COMPARISON:  CT cervical spine 06/19/2019 FINDINGS: Alignment: Reversal of the expected cervical lordosis. No significant spondylolisthesis. Vertebrae: Vertebral body height is maintained. No significant marrow edema to suggest occult cervical spine fracture. Cord: No spinal cord signal abnormality Posterior Fossa, vertebral arteries, paraspinal tissues: Redemonstrated right cerebellar acute infarct. Vertebral arteries better assessed on concurrent MRA neck. There is significant soft tissue and intramuscular edema within bilateral neck (greater on the right). This includes prominent edema throughout the right sternocleidomastoid muscle. A edema also extends into the parapharyngeal spaces bilaterally. There is more focal edema within the dorsal paraspinal musculature along the left C4 lamina (series 8, image 12). There is prevertebral edema versus hematoma extending from the skull base to the C6 level, measuring up to 8 mm in AP dimension. Partially visualized  support tubes. Partially visualized support tubes. Disc levels: Mild-to-moderate disc degeneration greatest at C4-C5 and C5-C6. C2-C3: No disc herniation. No significant canal or foraminal stenosis. C3-C4: No disc herniation. No significant canal or foraminal stenosis. C4-C5: Minimal disc bulge. No significant spinal canal or neural foraminal narrowing. C5-C6: Minimal disc bulge and right-sided uncinate hypertrophy. No significant spinal canal or neural foraminal narrowing. C6-C7: No disc herniation. No significant canal or foraminal stenosis. C7-T1: No disc herniation. No significant canal or foraminal stenosis. IMPRESSION: Mild cervical spondylosis without significant spinal canal or neural foraminal narrowing at any level. No evidence of spinal cord signal abnormality or epidural hematoma. Marked nonspecific soft tissue and  Chief Complaint   Chief Complaint  Patient presents with  . Cardiac Arrest    HPI   Consult requested by: Dr Drucie Ip, PCCM [facility] Reason for consult: cerebellar stroke  HPI: Jenny Myers is a 29 y.o. 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 Patient in PEA arrest upon EMS arrival, ROSC achieved in 4 minutes. Details surrounding the event are rather unclear/not known. There is report that patient called friend at 0100 today to come pick her up. When friend arrived patient was not there, but her iphone was. EMS called to house where patient was found. Concern initially of OD (cocacine and THC +) however, concern for trauma given significant bruising. Patient was reportedly "popping pills all night".  She underwent work up which included MRI/MRA brain. 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 NSY consultation requested for consideration of craniectomy.    Patient Active Problem List   Diagnosis Date Noted  . Cardiac arrest (HCC) 08-11-19  . Acute respiratory failure (HCC)   . Endotracheally intubated   . Hypoxic ischemic encephalopathy   . Cytotoxic cerebral edema (HCC)   . Subgaleal hemorrhage     PMH: Past Medical History:  Diagnosis Date  . ACS (acute coronary syndrome) (HCC)   . STEMI (ST elevation myocardial infarction) (HCC)     PSH: Past Surgical History:  Procedure Laterality Date  . CARDIAC CATHETERIZATION      No medications prior to admission.    SH: Social History   Tobacco Use  . Smoking status: Not on file  Substance Use Topics  . Alcohol use: Not on file  . Drug use: Yes    Types: Marijuana    MEDS: Prior to Admission medications   Not on File    ALLERGY: Allergies  Allergen Reactions  . Garlic   . Penicillins     Social History    Tobacco Use  . Smoking status: Not on file  Substance Use Topics  . Alcohol use: Not on file     History reviewed. No pertinent family history.   ROS   ROS intubated  Exam   Vitals:   August 11, 2019 1318 08/11/19 1529  BP: (!) 149/44 (!) 144/82  Pulse: 89 99  Resp: (!) 22 (!) 22  Temp:    SpO2: 100% 100%   Intubated NO sedation running Facial edema noted Pupils 2mm, R nonreactive, L sluggishly reactive No corneal, minimal gag No response to central pain  Results - Imaging/Labs   Results for orders placed or performed during the hospital encounter of 08/11/19 (from the past 48 hour(s))  Rapid urine drug screen (hospital performed)     Status: Abnormal   Collection Time: 11-Aug-2019  1:42 AM  Result Value Ref Range   Opiates NONE DETECTED NONE DETECTED   Cocaine POSITIVE (A) NONE DETECTED   Benzodiazepines NONE DETECTED NONE DETECTED   Amphetamines NONE DETECTED NONE DETECTED   Tetrahydrocannabinol POSITIVE (A) NONE DETECTED   Barbiturates NONE DETECTED NONE DETECTED    Comment: (NOTE) DRUG SCREEN FOR MEDICAL PURPOSES ONLY.  IF CONFIRMATION IS NEEDED FOR ANY PURPOSE, NOTIFY LAB WITHIN 5 DAYS. LOWEST DETECTABLE LIMITS FOR URINE DRUG SCREEN Drug Class                     Cutoff (ng/mL) Amphetamine and

## 2019-07-12 NOTE — ED Notes (Signed)
Pt taken with RT and RN to MRI
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
## 2019-07-12 NOTE — SANE Note (Signed)
   Date - 07/01/2019 Patient Name - Jenny Myers Patient MRN - [id number] Patient DOB - Dec 22, 1990 Patient Gender - female  EVIDENCE CHECKLIST AND DISPOSITION OF EVIDENCE  I. EVIDENCE COLLECTION  Follow the instructions found in the [place] Collection Kit.  Clearly identify, date, initial and seal all containers.  Check off items that are collected:   A. Unknown Samples    Collected?     Not Collected?  Why? 1. Outer Clothing X        2. Underpants - Panties X        3. Oral Swabs X        4. Pubic Hair Combings    X     5. Vaginal Swabs X        6. Rectal Swabs  X        7. Toxicology Samples    X     PATIENT BREASTS X        EXTERNAL GENITALIA X            B. Known Samples:        Collect in every case      Collected?    Not Collected    Why? 1. Pulled Pubic Hair Sample    X   Patient legs contracted  2. Pulled Head Hair Sample    X   Patient parent declined  3. Known Cheek Scraping X        4. Known Cheek Scraping  X               C. Photographs   1. By Whom   A. Ann Lions, RN, FNE  2. Describe photographs PATIENT, BOOKEND  3. Photo given to  [place]         II. DISPOSITION OF EVIDENCE      A. Law Enforcement    1. [place]   2. Officer SEE [place]    1. Officer NA           C. Chain of Custody: See outside of box.

## 2019-07-12 NOTE — ED Notes (Signed)
[phone] pts sister Jenny Myers
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
## 2019-07-12 NOTE — ED Notes (Addendum)
Swelling noted to R side of face prior to central line insertion. Pressure wounds not to chin, R breast, R leg at arrival  Repeat labs sent to lab
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
## 2019-07-12 NOTE — Progress Notes (Signed)
RT note-Patient transported to CT and back to 2[facility], remains on current settings. Fio2 decreased to 60%

## 2019-07-12 NOTE — ED Provider Notes (Signed)
[facility]   CSN: [id number] Arrival date & time: 06/19/2019  0127     History Chief Complaint  Patient presents with  . Cardiac Arrest    Darrelyn HillockLatrice Ressel is a [age] y.o. female.  Patient brought to the emergency department by EMS from home.  Patient's family told EMS that she had been taking an unknown pill "all day long".  It is unclear exactly what happened after that but they found her unresponsive.  They called 911.  First responders arrived 9 minutes later and found her to have no pulses.  They started CPR.  They found her pupils to be pinpoint, administered IM Narcan.  First responders performed CPR for 2 minutes before EMS arrived.  EMS reports that they continued CPR for 2 more minutes and then had return of spontaneous circulation.  They did, however, no profound bradycardia, initiated transcutaneous pacing.  Patient brought to the emergency department being paced, agonal respirations assisted by bag valve mask. Level V Caveat due to acuity.         Past Medical History:  Diagnosis Date  . ACS (acute coronary syndrome) (HCC)   . STEMI (ST elevation myocardial infarction) [facility](HCC)     Patient Active Problem List   Diagnosis Date Noted  . Cardiac arrest (HCC) 07/01/2019       OB History   No obstetric history on file.     No family history on file.  Social History   Tobacco Use  . Smoking status: Not on file  Substance Use Topics  . Alcohol use: Not on file  . Drug use: Yes    Types: Marijuana    Home Medications Prior to Admission medications   Not on File    Allergies    Garlic and Penicillins  Review of Systems   Review of Systems  Unable to perform ROS: Acuity of condition    Physical Exam Updated Vital Signs BP (!) 116/93   Pulse 82   Temp (!) 86.7 F (30.4 C)   Resp 17   Ht 5\' 2"  (1.575 m)   Wt 59 kg   SpO2 93%   BMI 23.78 kg/m   Physical Exam Vitals and nursing note reviewed.    Constitutional:      Appearance: He is ill-appearing.  HENT:     Head:     Comments: Erythema and swelling jaw Eyes:     Comments: Left pupil 4 mm, right pupil 1 to 2 mm  Cardiovascular:     Rate and Rhythm: Regular rhythm. Bradycardia present.     Heart sounds: Normal heart sounds.  Pulmonary:     Effort: Bradypnea present.     Breath sounds: Decreased breath sounds present.  Abdominal:     Palpations: Abdomen is soft.  Musculoskeletal:        General: No deformity.     Cervical back: Neck supple.  Skin:    Comments: Well-circumscribed area of erythema and induration right leg and right forearm insistent with pressure injury  Neurological:     Comments: Unresponsive     ED Results / Procedures / Treatments   Labs (all labs ordered are listed, but only abnormal results are displayed) Labs Reviewed  LACTIC ACID, PLASMA - Abnormal; Notable for the following components:      Result Value   Lactic Acid, Venous 9.7 (*)    All other components within normal limits  LIPASE, BLOOD - Abnormal; Notable for the following components:  Lipase 90 (*)    All other components within normal limits  SALICYLATE LEVEL - Abnormal; Notable for the following components:   Salicylate Lvl <6.0 (*)    All other components within normal limits  ACETAMINOPHEN LEVEL - Abnormal; Notable for the following components:   Acetaminophen (Tylenol), Serum <10 (*)    All other components within normal limits  AMMONIA - Abnormal; Notable for the following components:   Ammonia 215 (*)    All other components within normal limits  RAPID URINE DRUG SCREEN, HOSP PERFORMED - Abnormal; Notable for the following components:   Cocaine POSITIVE (*)    Tetrahydrocannabinol POSITIVE (*)    All other components within normal limits  I-STAT BETA HCG BLOOD, ED ([facility], [facility], [facility] ONLY) - Abnormal; Notable for the following components:   I-stat hCG, quantitative 6.7 (*)    All other components within normal limits  I-STAT  CHEM 8, ED - Abnormal; Notable for the following components:   Chloride 112 (*)    BUN 37 (*)    Creatinine, Ser 2.80 (*)    Glucose, Bld 141 (*)    Calcium, Ion 0.79 (*)    TCO2 15 (*)    All other components within normal limits  POCT I-STAT 7, (LYTES, BLD GAS, ICA,H+H) - Abnormal; Notable for the following components:   pH, Arterial 6.916 (*)    pCO2 arterial 72.7 (*)    pO2, Arterial 566.0 (*)    Bicarbonate 14.8 (*)    TCO2 17 (*)    Acid-base deficit 19.0 (*)    Sodium 133 (*)    Potassium 7.1 (*)    Calcium, Ion 0.92 (*)    HCT 50.0 (*)    Hemoglobin 17.0 (*)    All other components within normal limits  POCT I-STAT 7, (LYTES, BLD GAS, ICA,H+H) - Abnormal; Notable for the following components:   pH, Arterial 7.081 (*)    pCO2 arterial 51.5 (*)    pO2, Arterial 55.0 (*)    Bicarbonate 17.5 (*)    TCO2 20 (*)    Acid-base deficit 15.0 (*)    Potassium 6.2 (*)    Calcium, Ion 0.82 (*)    All other components within normal limits  TROPONIN I (HIGH SENSITIVITY) - Abnormal; Notable for the following components:   Troponin I (High Sensitivity) 279 (*)    All other components within normal limits  RESPIRATORY PANEL BY RT PCR (FLU A&B, COVID)  ETHANOL  BLOOD GAS, ARTERIAL  CK  LACTIC ACID, PLASMA  CBC WITH DIFFERENTIAL/PLATELET  COMPREHENSIVE METABOLIC PANEL  MAGNESIUM  HIV ANTIBODY (ROUTINE TESTING W REFLEX)  CBC  CREATININE, SERUM  HCG, QUANTITATIVE, PREGNANCY  TROPONIN I (HIGH SENSITIVITY)    EKG EKG Interpretation  Date/Time:  Sunday July 12 2019 02:09:26 EST Ventricular Rate:  59 PR Interval:    QRS Duration: 140 QT Interval:  512 QTC Calculation: 508 R Axis:   82 Text Interpretation: Sinus rhythm Nonspecific intraventricular conduction delay Minimal ST depression, inferior leads Confirmed by Orpah Greek [phone]) on 07/03/2019 2:11:09 AM   Radiology CT HEAD WO CONTRAST  Result Date: 07/02/2019 CLINICAL DATA:  Found on ground. Cardiac  arrest with unknown down time. Found unresponsive in bathroom. EXAM: CT HEAD WITHOUT CONTRAST TECHNIQUE: Contiguous axial images were obtained from the base of the skull through the vertex without intravenous contrast. COMPARISON:  None. FINDINGS: Brain: Motion artifact limitations. Loss of gray-white differentiation and hypodensity involving the inferior right cerebellum and right posterior parietooccipital  lobes suspicious for acute ischemia. Gray-white differentiation is otherwise preserved. No definite sulcal effacement, the basilar cisterns are patent. No hemorrhage. No subdural or extra-axial collection. Vascular: No hyperdense vessel. Skull: No fracture or focal lesion. Sinuses/Orbits: Assessed on concurrent face CT, reported separately. Other: Possible left parietal scalp hematoma. IMPRESSION: Loss of gray-white differentiation and hypodensity involving the inferior right cerebellum and right posterior parietooccipital lobes suspicious for acute ischemia. Recommend brain MRI for further evaluation and confirmation. Electronically Signed   By: Narda Rutherford M.D.   On: 06/19/2019 03:30   CT CERVICAL SPINE WO CONTRAST  Result Date: 06/27/2019 CLINICAL DATA:  Unresponsive. Unknown down time. Found unresponsive in bathroom. EXAM: CT CERVICAL SPINE WITHOUT CONTRAST TECHNIQUE: Multidetector CT imaging of the cervical spine was performed without intravenous contrast. Multiplanar CT image reconstructions were also generated. COMPARISON:  None. FINDINGS: Motion artifact, especially through C2. Alignment: Straightening of normal lordosis. No traumatic subluxation. Skull base and vertebrae: No evidence of fracture allowing for motion artifact limitations. Vertebral body heights are preserved. No focal pathologic process. Soft tissues and spinal canal: No gross prevertebral soft tissue edema. No evidence of canal hematoma, allowing for motion limitations. Disc levels:  Normal. Upper chest: Lung apices are clear.  Other: None. IMPRESSION: Motion limited exam. No evidence of fracture or traumatic subluxation of the cervical spine. Electronically Signed   By: Narda Rutherford M.D.   On: 06/21/2019 03:33   DG Chest Port 1 View  Result Date: 07/15/2019 CLINICAL DATA:  Central line placement. EXAM: PORTABLE CHEST 1 VIEW COMPARISON:  Earlier this day. FINDINGS: Endotracheal tube is been retracted, tip now 3.2 cm from the carina. Enteric tube in place with tip and side-port below the diaphragm. Right internal jugular central venous catheter tip in the mid lower SVC. No pneumothorax. Low lung volumes. Minimal linear atelectasis or scarring in the right mid upper lung zone. Lungs otherwise clear. No pleural fluid. Normal heart size and mediastinal contours. IMPRESSION: 1. Tip of the right internal jugular central venous catheter in the mid lower SVC. No pneumothorax. 2. Endotracheal tube tip now 3.2 cm from the carina. 3. Minimal atelectasis or scarring in the right mid upper lung zone. Electronically Signed   By: Narda Rutherford M.D.   On: 07/19/2019 03:38   DG Chest Port 1 View  Result Date: 07/11/2019 CLINICAL DATA:  Intubation. Cardiac arrest. EXAM: PORTABLE CHEST 1 VIEW COMPARISON:  None. FINDINGS: Right mainstem intubation. Recommend retraction of approximately 3 cm. Enteric tube tip and side-port below the diaphragm in the stomach. Clear lungs. Normal cardiomediastinal contours. No pulmonary edema, pleural effusion or pneumothorax. No acute osseous abnormalities are seen. IMPRESSION: Right mainstem intubation. Recommend retraction of approximately 2-3 cm. Enteric tube tip and side-port below the diaphragm in the stomach. These results were called by telephone at the time of interpretation on 06/24/2019 at 2:12 am to Dr Jaci Carrel , who verbally acknowledged these results. Electronically Signed   By: Narda Rutherford M.D.   On: 06/28/2019 02:13   CT MAXILLOFACIAL WO CONTRAST  Result Date: 06/29/2019 CLINICAL  DATA:  Facial trauma. Cardiac arrest. Found unresponsive. EXAM: CT MAXILLOFACIAL WITHOUT CONTRAST TECHNIQUE: Multidetector CT imaging of the maxillofacial structures was performed. Multiplanar CT image reconstructions were also generated. COMPARISON:  None. FINDINGS: Osseous: Nondisplaced left nasal fracture, age indeterminate. Nasogastric tube through the right near. Zygomatic arches and mandibles are intact. Temporomandibular joints are congruent. There are scattered missing teeth. Endotracheal tube in place. Orbits: No acute orbital fracture. Both orbits and globes  are intact. Sinuses: No sinus fracture or fluid level. Small mucous retention cyst in the right maxillary sinus. Mastoid air cells are clear. Soft tissues: Negative. Limited intracranial: Assessed on concurrent head CT, reported separately. IMPRESSION: Nondisplaced left nasal fracture, age indeterminate. No other facial bone fracture. Electronically Signed   By: Narda Rutherford M.D.   On: 06/19/2019 03:36    Procedures Procedure Name: Intubation Date/Time: 07/11/2019 2:34 AM Performed by: Gilda Crease, MD Pre-anesthesia Checklist: Patient identified, Patient being monitored, Emergency Drugs available, Timeout performed and Suction available Oxygen Delivery Method: Non-rebreather mask Preoxygenation: Pre-oxygenation with 100% oxygen Induction Type: Rapid sequence Ventilation: Mask ventilation without difficulty Laryngoscope Size: Glidescope Grade View: Grade I Tube size: 7.5 mm Number of attempts: 1 Placement Confirmation: ETT inserted through vocal cords under direct vision,  CO2 detector and Breath sounds checked- equal and bilateral Secured at: 23 cm Tube secured with: ETT holder Dental Injury: Teeth and Oropharynx as per pre-operative assessment  Difficulty Due To: Difficulty was unanticipated and Difficult Airway- due to limited oral opening Comments: Mallampati class IV secondary to extremely limited opening of mouth  -could only separate teeth approximately 1 cm despite paralytics  Chest x-ray shows right mainstem intubation.  ET tube retracted 2 cm.    Aspiration of blood/fluid  Date/Time: 07/09/2019 2:37 AM Performed by: Gilda Crease, MD Authorized by: Gilda Crease, MD  Consent: The procedure was performed in an emergent situation. Site marked: the operative site was marked Imaging studies: imaging studies available Patient identity confirmed: arm band Time out: Immediately prior to procedure a "time out" was called to verify the correct patient, procedure, equipment, support staff and site/side marked as required. Preparation: Patient was prepped and draped in the usual sterile fashion. Local anesthesia used: no  Anesthesia: Local anesthesia used: no Patient tolerance: patient tolerated the procedure well with no immediate complications Comments: Right femoral artery blood draw performed -no ultrasound guidance.  Pulse was palpated and then 18-gauge needle was advanced to draw blood.  After needle was removed, pressure held for 5 minutes.  .Critical Care Performed by: Gilda Crease, MD Authorized by: Gilda Crease, MD   Critical care provider statement:    Critical care time (minutes):  45   Critical care time was exclusive of:  Separately billable procedures and treating other patients   Critical care was necessary to treat or prevent imminent or life-threatening deterioration of the following conditions:  Cardiac failure, respiratory failure and CNS failure or compromise   Critical care was time spent personally by me on the following activities:  Blood draw for specimens, ordering and performing treatments and interventions, ordering and review of laboratory studies, ordering and review of radiographic studies, discussions with consultants, pulse oximetry, re-evaluation of patient's condition, evaluation of patient's response to treatment, examination of  patient and review of old charts   I assumed direction of critical care for this patient from another provider in my specialty: no   .Central Line  Date/Time: 07/19/2019 3:17 AM Performed by: Gilda Crease, MD Authorized by: Gilda Crease, MD   Consent:    Consent obtained:  Emergent situation Universal protocol:    Site/side marked: yes     Immediately prior to procedure, a time out was called: yes     Patient identity confirmed:  Hospital-assigned identification number Pre-procedure details:    Hand hygiene: Hand hygiene performed prior to insertion     Sterile barrier technique: All elements of maximal sterile technique followed  Skin preparation:  2% chlorhexidine   Skin preparation agent: Skin preparation agent completely dried prior to procedure   Anesthesia (see MAR for exact dosages):    Anesthesia method:  None Procedure details:    Location:  R internal jugular   Patient position:  Trendelenburg   Procedural supplies:  Triple lumen   Catheter size:  7 Fr   Landmarks identified: yes     Ultrasound guidance: yes     Sterile ultrasound techniques: Sterile gel and sterile probe covers were used     Number of attempts:  1   Successful placement: yes   Post-procedure details:    Post-procedure:  Dressing applied and line sutured   Assessment:  Blood return through all ports, free fluid flow, no pneumothorax on x-ray and placement verified by x-ray   Patient tolerance of procedure:  Tolerated well, no immediate complications ARTERIAL BLOOD GAS  Date/Time: 07/04/2019 3:34 AM Performed by: Gilda Crease, MD Authorized by: Gilda Crease, MD   Consent:    Consent obtained:  Emergent situation Universal protocol:    Site/side marked: yes     Immediately prior to procedure, a time out was called: yes     Patient identity confirmed:  Arm band Anesthesia (see MAR for exact dosages):    Anesthesia method:  None Procedure details:     Location of arterial blood gas: left brachial.   Number of attempts:  1 Post-procedure details:    Dressing applied: yes     Bleeding:  Manual pressure applied   Circulation, movement, and sensation:  Normal   Patient tolerance of procedure:  Tolerated well, no immediate complications Comments:     Ultrasound guided   (including critical care time)  Medications Ordered in ED Medications  propofol (DIPRIVAN) 1000 MG/100ML infusion (has no administration in time range)  fentaNYL in NS (69mcg/ml) infusion-PREMIX (50 mcg/hr Intravenous New Bag/Given 06/21/2019 0247)  sodium bicarbonate 150 mEq in dextrose 5% 1000 mL infusion (has no administration in time range)  heparin injection 5,000 Units (has no administration in time range)  pantoprazole (PROTONIX) injection 40 mg (has no administration in time range)  lactulose (CHRONULAC) 10 GM/15ML solution 20 g (has no administration in time range)  midazolam (VERSED) injection 2 mg (has no administration in time range)  midazolam (VERSED) injection 2 mg (has no administration in time range)  fentaNYL (SUBLIMAZE) injection 50 mcg (has no administration in time range)  fentaNYL in NS (29mcg/ml) infusion-PREMIX (has no administration in time range)  fentaNYL (SUBLIMAZE) bolus via infusion 50 mcg (has no administration in time range)  etomidate (AMIDATE) injection (20 mg Intravenous Given 06/28/2019 0131)  succinylcholine (ANECTINE) injection (100 mg Intravenous Given 06/25/2019 0132)  sodium chloride 0.9 % bolus 1,000 mL (0 mLs Intravenous Stopped 06/23/2019 0247)  sodium bicarbonate injection 100 mEq (100 mEq Intravenous Given 07/11/2019 0203)  fentaNYL (SUBLIMAZE) injection 100 mcg (100 mcg Intravenous Given 07/11/2019 0228)  sodium bicarbonate 150 mEq in dextrose 5% 1000 mL infusion (150 mEq Intravenous New Bag/Given 07/18/2019 0400)  sodium chloride 0.9 % bolus 1,000 mL (1,000 mLs Intravenous New Bag/Given 06/29/2019 0324)    ED Course    I have reviewed the triage vital signs and the nursing notes.  Pertinent labs & imaging results that were available during my care of the patient were reviewed by me and considered in my medical decision making (see chart for details).    MDM Rules/Calculators/A&P  Patient presents to the emergency department for evaluation after cardiac arrest.  Very little information is available.  EMS was told by family that they noted that she had been taking an unknown pill serially throughout the day.  No bottles or pills were found by EMS to identify.  Family report finding her unresponsive.  There was a 9-minute delay from when the call went out until first responders arrived and started CPR.  She had approximately 4 minutes of CPR prior to ROSC.  No drugs were administered.  Patient was brought to the ER with agonal respirations, she was assisted by bag-valve-mask.  EMS reported bradycardia and initiated transcutaneous pacing.  At some point during the transfer from EMS stretcher to the bed, they discontinued the transcutaneous pacing and she continued to have spontaneous circulation, so this was not continued.  She was noted to be bradycardic, however.  Patient was intubated at arrival.  Intubation was difficult because she had very little movement of the jaw.  I was only able to separate her upper and lower incisors by approximately 1 cm but was able to get the glide scope positioned and a tube passed between the teeth and through the vocal cords under direct visualization.  Immobility of the jaw etiology is unclear, but she has significant swelling of the submandibular space and obvious pressure injury to the skin under the jaw, as she had obviously been laying in one position for an extended period of time.  Patient hypothermic at arrival.  Initial blood gas shows significant acidosis.  As she was bradycardic and hypotensive, was given 2 A of bicarb which temporarily improved blood  pressure and heart rate.  Repeat blood gas showed persistent acidosis and a bicarb drip was ordered.  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  Drug screen positive for cocaine and marijuana.  Suspect overdose was the initial cause of the code, based on limited history provided.  Critical care consulted to assume care of the patient.   Addendum: Sister Malachi Bonds(Gloria) [phone] just called and informed me that the patient has been missing for 2 days.  She filed a missing person report yesterday.  Sister tells me that the patient called her yesterday and told her to come pick her up.  Sister says that when she got to the place that the patient was supposed to be, someone there told her the patient was no longer there.  Sister continued to look for her but was unable to find her.  Final Clinical Impression(s) / ED Diagnoses Final diagnoses:  Cardiac arrest [facility](HCC)    Rx / DC Orders ED Discharge Orders    None       Ziaire Hagos, Canary Brimhristopher J, MD 07/19/2019 [phone]

## 2019-07-12 NOTE — ED Notes (Signed)
[phone] pts sister Malachi Bonds, wants an update

## 2019-07-12 NOTE — ED Notes (Signed)
[phone]  Grafton Folk stating she is a "sister" as well.

## 2019-07-12 NOTE — Progress Notes (Signed)
Patient transported to MRI and back to room 2H16 without complications.
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
## 2019-07-12 NOTE — ED Notes (Signed)
[phone] pts father Jamise Pentland
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
## 2019-07-12 NOTE — Progress Notes (Signed)
PT transported to CT and back with no complications 

## 2019-07-12 NOTE — Progress Notes (Signed)
Remaining 150mg  of Fentanyl infusion wasted with , RN in [place] cycle.

## 2019-07-12 NOTE — Progress Notes (Signed)
  Echocardiogram 2D Echocardiogram has been performed.  Jenny Myers 24-Jul-2019, 3:51 PM

## 2019-07-12 NOTE — ED Notes (Signed)
Date and time results received: 07/19/2019 447   Test: Potassium and Calcium Critical Value: 6.7 and 5.8  Name of Provider Notified: Dr. Blinda Leatherwood and Dr. Carlota Raspberry  Orders to be placed, Per Dr. Carlota Raspberry, MRI first.  Pt to MRI

## 2019-07-12 NOTE — Progress Notes (Signed)
Spoke with Chief Operating Officer  On the phone, updated on status. He will be in touch  With anything needed. If there are changes of that patient or any new issues. Call non emergency line [employer]
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
## 2019-07-12 NOTE — Progress Notes (Signed)
CRITICAL VALUE ALERT  Critical Value:  Calcium 6.2   Date & Time Notied:  06/28/2019 @1735   Provider Notified: , MD  Orders Received/Actions taken: MD to review other labs, awaiting orders.

## 2019-07-13 ENCOUNTER — Inpatient Hospital Stay (HOSPITAL_COMMUNITY): Payer: Medicaid Other

## 2019-07-13 ENCOUNTER — Encounter (HOSPITAL_COMMUNITY): Payer: Self-pay | Admitting: Critical Care Medicine

## 2019-07-13 DIAGNOSIS — J9601 Acute respiratory failure with hypoxia: Secondary | ICD-10-CM

## 2019-07-13 LAB — CBC
HCT: 42.3 % (ref 36.0–46.0)
Hemoglobin: 13.8 g/dL (ref 12.0–15.0)
MCH: 29.7 pg (ref 26.0–34.0)
MCHC: 32.6 g/dL (ref 30.0–36.0)
MCV: 91.2 fL (ref 80.0–100.0)
Platelets: 191 10*3/uL (ref 150–400)
RBC: 4.64 MIL/uL (ref 3.87–5.11)
RDW: 14.8 % (ref 11.5–15.5)
WBC: 12 10*3/uL — ABNORMAL HIGH (ref 4.0–10.5)
nRBC: 0.6 % — ABNORMAL HIGH (ref 0.0–0.2)

## 2019-07-13 LAB — HEPATIC FUNCTION PANEL
ALT: 541 U/L — ABNORMAL HIGH (ref 0–44)
AST: 1343 U/L — ABNORMAL HIGH (ref 15–41)
Albumin: 1.3 g/dL — ABNORMAL LOW (ref 3.5–5.0)
Alkaline Phosphatase: 64 U/L (ref 38–126)
Bilirubin, Direct: 0.2 mg/dL (ref 0.0–0.2)
Indirect Bilirubin: 0.4 mg/dL (ref 0.3–0.9)
Total Bilirubin: 0.6 mg/dL (ref 0.3–1.2)
Total Protein: 3.5 g/dL — ABNORMAL LOW (ref 6.5–8.1)

## 2019-07-13 LAB — URINALYSIS, ROUTINE W REFLEX MICROSCOPIC
Bilirubin Urine: NEGATIVE
Glucose, UA: 50 mg/dL — AB
Ketones, ur: NEGATIVE mg/dL
Nitrite: NEGATIVE
Protein, ur: 100 mg/dL — AB
Specific Gravity, Urine: 1.018 (ref 1.005–1.030)
WBC, UA: >50 WBC/hpf — ABNORMAL HIGH (ref 0–5)
pH: 5 (ref 5.0–8.0)

## 2019-07-13 LAB — BASIC METABOLIC PANEL
Anion gap: 13 (ref 5–15)
Anion gap: 15 (ref 5–15)
BUN: 36 mg/dL — ABNORMAL HIGH (ref 6–20)
BUN: 41 mg/dL — ABNORMAL HIGH (ref 6–20)
CO2: 26 mmol/L (ref 22–32)
CO2: 28 mmol/L (ref 22–32)
Calcium: 6 mg/dL — CL (ref 8.9–10.3)
Calcium: 5.8 mg/dL — CL (ref 8.9–10.3)
Chloride: 110 mmol/L (ref 98–111)
Chloride: 106 mmol/L (ref 98–111)
Creatinine, Ser: 3.14 mg/dL — ABNORMAL HIGH (ref 0.44–1.00)
Creatinine, Ser: 3.44 mg/dL — ABNORMAL HIGH (ref 0.44–1.00)
GFR calc Af Amer: 22 mL/min — ABNORMAL LOW (ref >60)
GFR calc Af Amer: 20 mL/min — ABNORMAL LOW (ref >60)
GFR calc non Af Amer: 19 mL/min — ABNORMAL LOW (ref >60)
GFR calc non Af Amer: 17 mL/min — ABNORMAL LOW (ref >60)
Glucose, Bld: 148 mg/dL — ABNORMAL HIGH (ref 70–99)
Glucose, Bld: 152 mg/dL — ABNORMAL HIGH (ref 70–99)
Potassium: 3.4 mmol/L — ABNORMAL LOW (ref 3.5–5.1)
Potassium: 3.2 mmol/L — ABNORMAL LOW (ref 3.5–5.1)
Sodium: 149 mmol/L — ABNORMAL HIGH (ref 135–145)
Sodium: 149 mmol/L — ABNORMAL HIGH (ref 135–145)

## 2019-07-13 LAB — GLUCOSE, CAPILLARY
Glucose-Capillary: 174 mg/dL — ABNORMAL HIGH (ref 70–99)
Glucose-Capillary: 123 mg/dL — ABNORMAL HIGH (ref 70–99)

## 2019-07-13 LAB — PHOSPHORUS: Phosphorus: 2.2 mg/dL — ABNORMAL LOW (ref 2.5–4.6)

## 2019-07-13 LAB — MAGNESIUM: Magnesium: 1.4 mg/dL — ABNORMAL LOW (ref 1.7–2.4)

## 2019-07-13 LAB — CK: Total CK: 40010 U/L — ABNORMAL HIGH (ref 38–234)

## 2019-07-13 LAB — LACTIC ACID, PLASMA: Lactic Acid, Venous: 4.8 mmol/L (ref 0.5–1.9)

## 2019-07-13 IMAGING — CT CT HEAD W/O CM
4 series · 16 of 47 positions shown, 18 images · non-contrast
Comparison: Yesterday

CLINICAL DATA: Stroke follow-up

EXAM:
CT HEAD WITHOUT CONTRAST
TECHNIQUE: Contiguous axial images were obtained from the base of the skull
through the vertex without intravenous contrast.

[Series 3: head wo · axial · 0.44mm/px · z∈[-150,-30]mm · 7 of 32 slices shown, 9 images]
[im 4/32  brain]
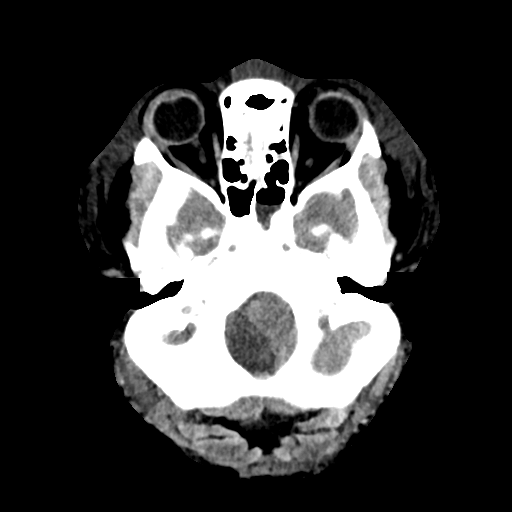
[im 4/32  bone]
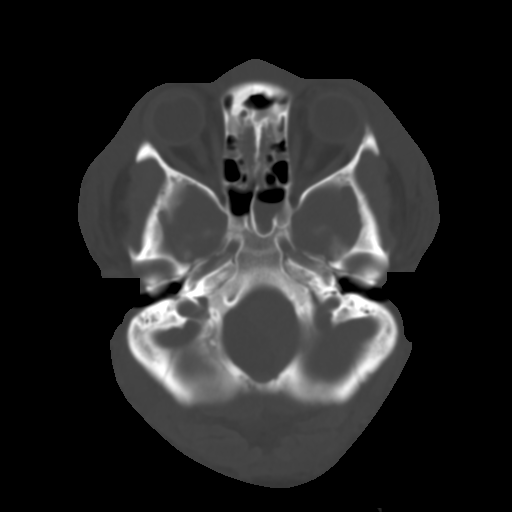
[im 8/32  brain]
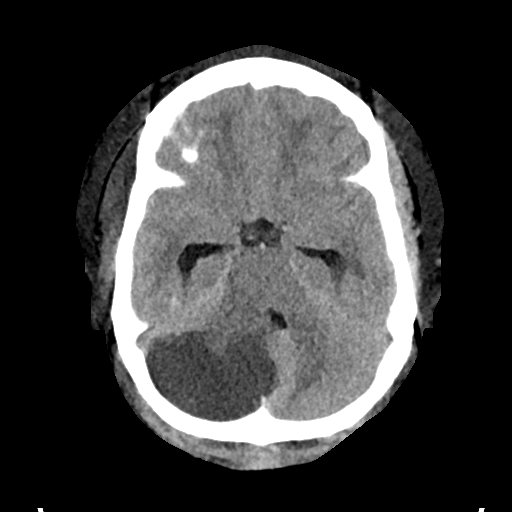
[im 12/32  brain]
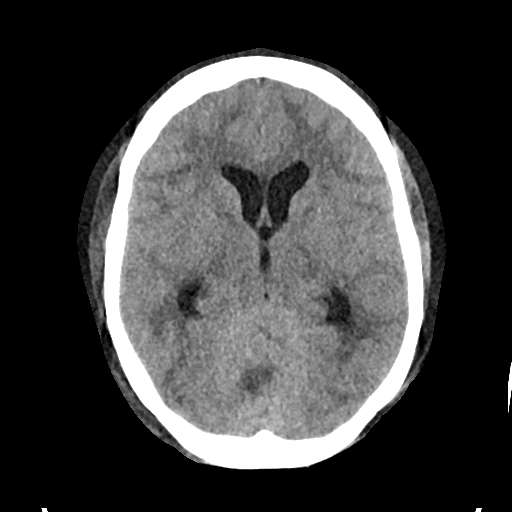
[im 16/32  brain]
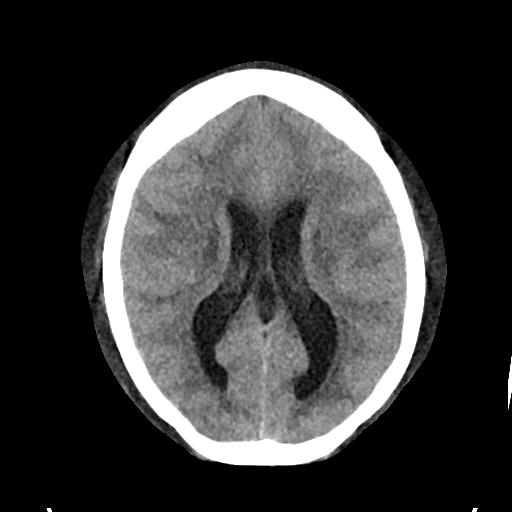
[im 20/32  brain]
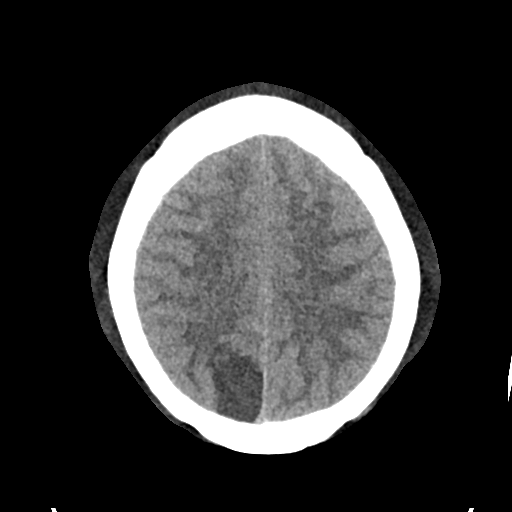
[im 20/32  bone]
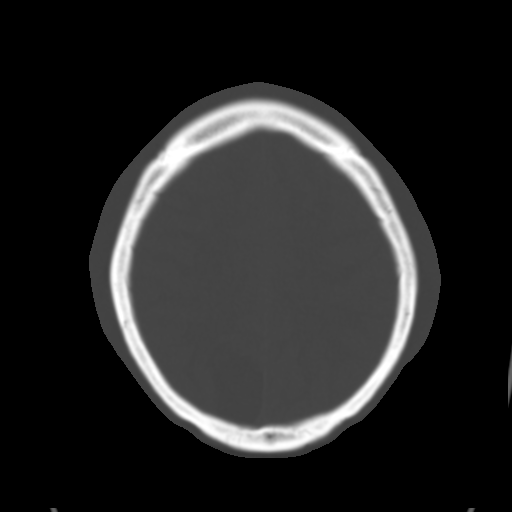
[im 24/32  brain]
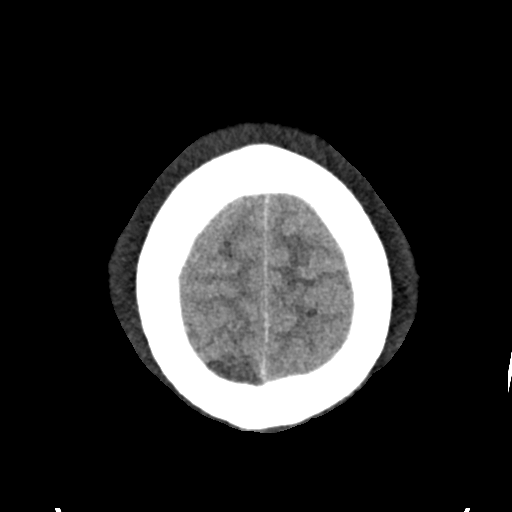
[im 28/32  brain]
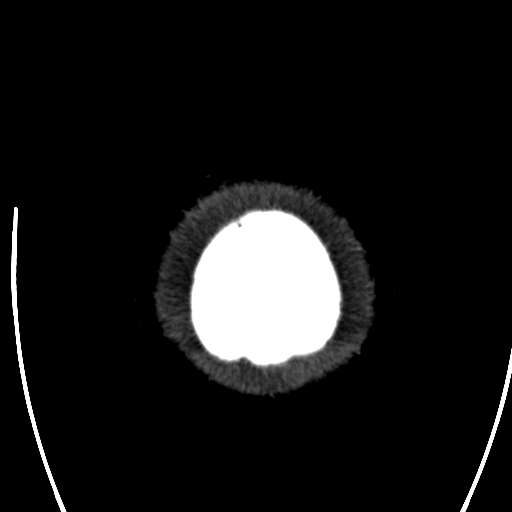

[Series 4: head bone · axial · 0.44mm/px · z∈[-152,-120]mm · 3 of 78 slices shown]
[im 8/78  bone]
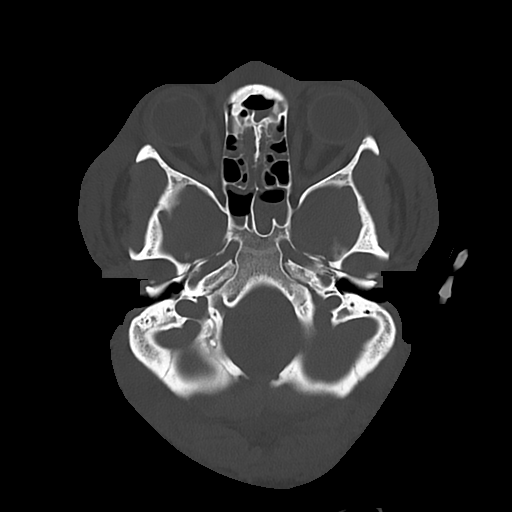
[im 16/78  bone]
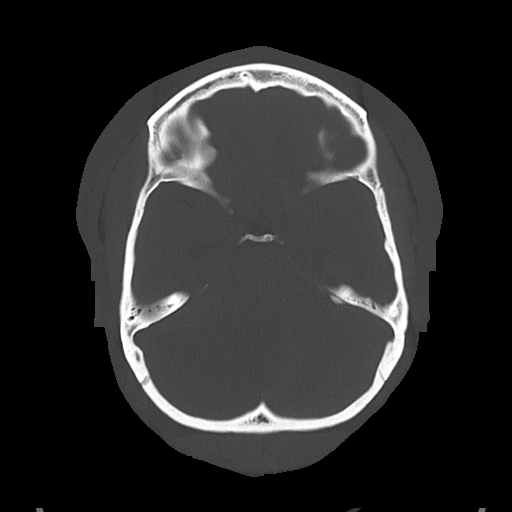
[im 24/78  bone]
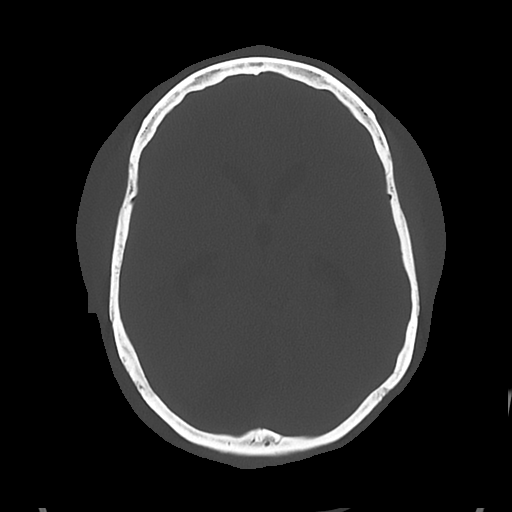

[Series 5: cor soft · coronal · 0.35mm/px · 3 of 70 slices shown]
[im 24/70  brain]
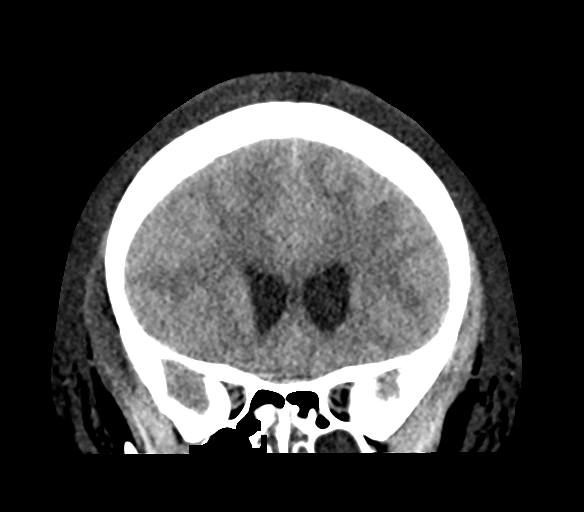
[im 31/70  brain]
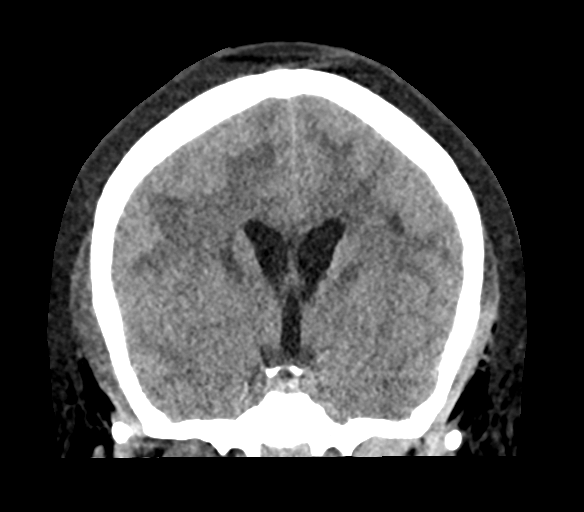
[im 39/70  brain]
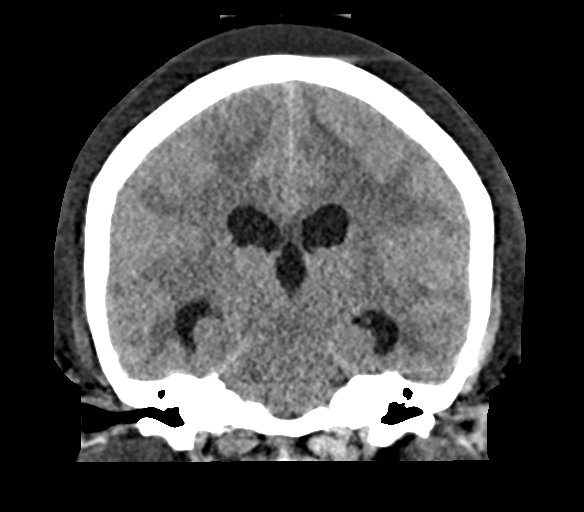

[Series 6: sag soft · sagittal · 0.35mm/px · 3 of 60 slices shown]
[im 20/60  brain]
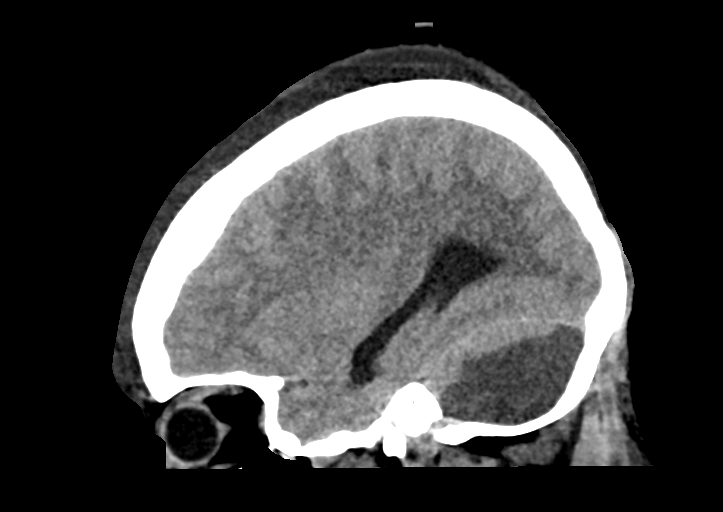
[im 30/60  brain]
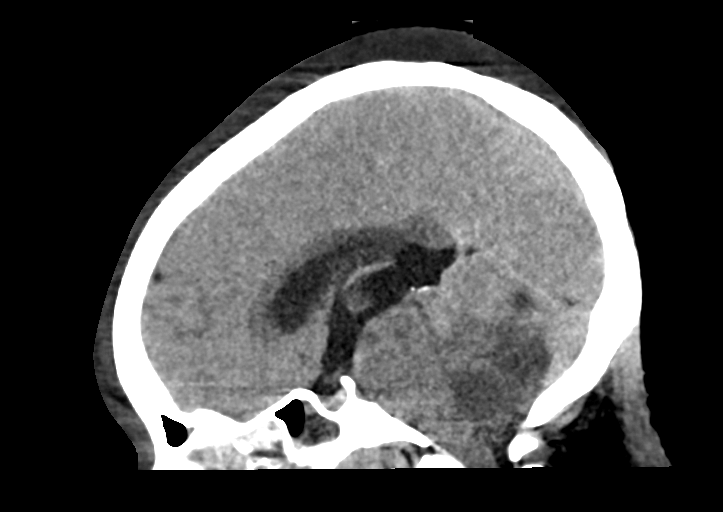
[im 40/60  brain]
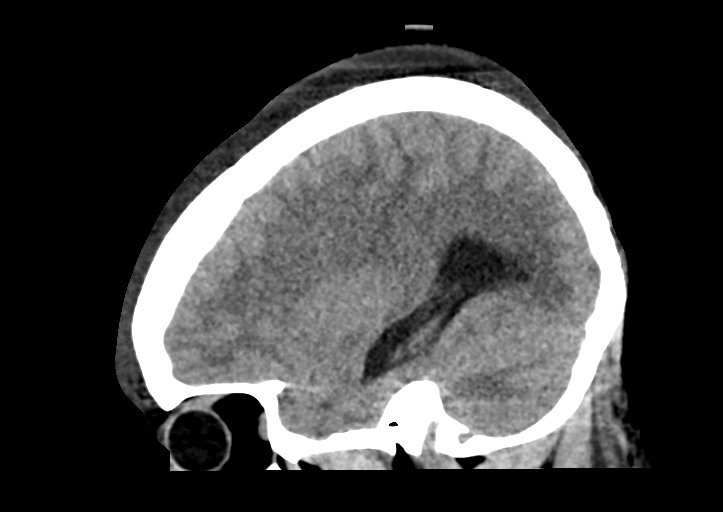

[16 of 47 positions shown; findings below may reference images not displayed]

FINDINGS: Brain: Known global anoxic injury with diffuse white matter injury
and bilateral deep gray nucleus infarcts. There are discrete
cortical infarcts in the right occipital parietal lobe and involving
the majority of the right cerebellum. Diffuse swelling with
effacement of sulci. There is also obstructive hydrocephalus at the
level of the fourth ventricle.

Vascular: No hyperdense vessel.

Skull: Negative for fracture.

Sinuses/Orbits: Intubation with sinus fluid levels.

Other: Marked scalp edema. There is intramuscular edema asymmetric
to the right in the visual upper neck, likely related to pressure
related ischemia.

Critical Value/emergent results were called by telephone at the time
of interpretation on [DATE] at [DATE] to Dr BUKHARI, who verbally
acknowledged these results.
IMPRESSION: 1. New obstructive hydrocephalus at the fourth ventricle due to the
cerebellar infarct.
2. Elevated intracranial pressure with progressive swelling in the
setting of known global anoxic injury

## 2019-07-13 MED ORDER — DEXTROSE 5 % IV SOLN: INTRAVENOUS | Status: DC

## 2019-07-13 MED ORDER — SODIUM CHLORIDE 23.4 % INJECTION (4 MEQ/ML) FOR IV ADMINISTRATION
120.0000 meq | Freq: Once | INTRAVENOUS | Status: DC
  Filled 2019-07-13: qty 30

## 2019-07-13 MED ORDER — MAGNESIUM SULFATE 4 GM/100ML IV SOLN: 4.0000 g | Freq: Once | INTRAVENOUS | Status: DC

## 2019-07-13 MED ORDER — DIPHENHYDRAMINE HCL 50 MG/ML IJ SOLN: 25.0000 mg | INTRAMUSCULAR | Status: DC | PRN

## 2019-07-13 MED ORDER — GLYCOPYRROLATE 1 MG PO TABS
1.0000 mg | ORAL_TABLET | ORAL | Status: DC | PRN
  Filled 2019-07-13: qty 1

## 2019-07-13 MED ORDER — MORPHINE SULFATE (PF) 2 MG/ML IV SOLN
2.0000 mg | INTRAVENOUS | Status: DC | PRN
  Administered 2019-07-13 (×2): 2 mg via INTRAVENOUS
  Filled 2019-07-13 (×3): qty 1

## 2019-07-13 MED ORDER — POLYVINYL ALCOHOL 1.4 % OP SOLN: 1.0000 [drp] | Freq: Four times a day (QID) | OPHTHALMIC | Status: DC | PRN

## 2019-07-13 MED ORDER — LEVOFLOXACIN IN D5W 750 MG/150ML IV SOLN: 750.0000 mg | INTRAVENOUS | Status: DC

## 2019-07-13 MED ORDER — NOREPINEPHRINE 16 MG/250ML-% IV SOLN
0.0000 ug/min | INTRAVENOUS | Status: DC
  Administered 2019-07-13: 40 ug/min via INTRAVENOUS
  Filled 2019-07-13 (×2): qty 250

## 2019-07-13 MED ORDER — GLYCOPYRROLATE 0.2 MG/ML IJ SOLN: 0.2000 mg | INTRAMUSCULAR | Status: DC | PRN

## 2019-07-13 MED ORDER — POTASSIUM CHLORIDE 10 MEQ/100ML IV SOLN: 10.0000 meq | INTRAVENOUS | Status: DC

## 2019-07-13 MED ORDER — LEVOFLOXACIN IN D5W 750 MG/150ML IV SOLN
750.0000 mg | INTRAVENOUS | Status: AC
  Administered 2019-07-13: 750 mg via INTRAVENOUS
  Filled 2019-07-13: qty 150

## 2019-07-13 MED ORDER — SODIUM CHLORIDE 0.9 % IV BOLUS
1000.0000 mL | Freq: Once | INTRAVENOUS | Status: AC
  Administered 2019-07-13: 1000 mL via INTRAVENOUS

## 2019-07-13 MED ORDER — METRONIDAZOLE IN NACL 5-0.79 MG/ML-% IV SOLN: 500.0000 mg | Freq: Three times a day (TID) | INTRAVENOUS | Status: DC

## 2019-07-13 MED ORDER — SODIUM PHOSPHATES 45 MMOLE/15ML IV SOLN
20.0000 mmol | Freq: Once | INTRAVENOUS | Status: DC
  Filled 2019-07-13: qty 6.67

## 2019-07-13 MED ORDER — ACETAMINOPHEN 325 MG PO TABS: 650.0000 mg | ORAL_TABLET | Freq: Four times a day (QID) | ORAL | Status: DC | PRN

## 2019-07-13 MED ORDER — ACETAMINOPHEN 650 MG RE SUPP: 650.0000 mg | Freq: Four times a day (QID) | RECTAL | Status: DC | PRN

## 2019-07-13 MED ORDER — METRONIDAZOLE IN NACL 5-0.79 MG/ML-% IV SOLN
500.0000 mg | Freq: Once | INTRAVENOUS | Status: AC
  Administered 2019-07-13: 500 mg via INTRAVENOUS
  Filled 2019-07-13: qty 100

## 2019-07-13 MED FILL — Medication: Qty: 1 | Status: AC

## 2019-07-15 ENCOUNTER — Encounter (HOSPITAL_COMMUNITY): Payer: Self-pay | Admitting: Emergency Medicine

## 2019-07-16 LAB — CULTURE, BLOOD (ROUTINE X 2): Special Requests: ADEQUATE

## 2019-07-17 LAB — CULTURE, RESPIRATORY W GRAM STAIN

## 2019-07-17 LAB — CULTURE, BLOOD (ROUTINE X 2)
Culture: NO GROWTH
Culture: NO GROWTH
Special Requests: ADEQUATE

## 2019-07-20 NOTE — Progress Notes (Signed)
Jenny Myers to come to patients room to examine her. RN will continue to monitor.
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
## 2019-07-20 NOTE — Progress Notes (Signed)
  NEUROSURGERY PROGRESS NOTE   Multiorgan system failure, hypoxia, hypotension.   EXAM:  BP (!) 77/44   Pulse (!) 113   Temp 99.7 F (37.6 C)   Resp (!) 22   Ht 5\' 2"  (1.575 m)   Wt 61.7 kg   LMP  (LMP Unknown)   SpO2 91%   BMI 24.88 kg/m   Intubated No sedation  Pupils 68mm right, 25mm left, nonreactive No response to central pain  PLAN 29 y.o. female with now multiorgan system failure. CT shows severe brain injury, worsening cerebellar edema resulting in obstructive hydrocephalus. Pupils now fixed and dilated. Dr 26 discussed with both Critical Care and Neuro. No role for NS intervention. Patient becoming increasingly hypotensive with pressures 60's/30's, MAP 40's. Dr Conchita Paris (critical care) discussed with patient's father at bedside. Patient will transition to comfort care.

## 2019-07-20 NOTE — Progress Notes (Signed)
Nutrition Brief Note  Chart reviewed. Pt now transitioning to comfort care.  No further nutrition interventions warranted at this time.  Please re-consult as needed.   Dede Dobesh RD, LDN Clinical Nutrition Pager # - [phone]    

## 2019-07-20 NOTE — Progress Notes (Signed)
Jenny Myers returned patients gold necklace to patients father Jenny Myers at this time.
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
## 2019-07-20 NOTE — Progress Notes (Signed)
eLink Physician-Brief Progress Note Patient Name: Jenny Myers DOB: [birth year] MRN: [id number]   Date of Service  July 20, 2019  HPI/Events of Note  Fever to 101.7 - Request for Tylenol. AST and ALT markedly elevated and Creatinine = 3.14. Therefore, can't use Tylenol or Motrin. Concern for aspiration. Allergy to penicillin. Sinus Tachycardia - HR = 134 and CVP = 3.   eICU Interventions  Will order: 1. Ice packs PRN. 2. Blood cultures X 2.  3. Tracheal aspirate culture now.  4. UA now. 5. Bolus with 0.9 NaCl 1 liter IV over 1 hour now.  6. Flagyl 500 mg IV now and Q 8 hours.  7. Levaquin per pharmacy.     Intervention Category Major Interventions: Infection - evaluation and management  Jaquilla Woodroof [place] 20-Jul-2019, 2:34 AM

## 2019-07-20 NOTE — Progress Notes (Signed)
Neurology Progress Note   S:// Seen and examined. Difficult to control blood pressures overnight.  More problems with hypotension. CT head this morning reviewed-new hydrocephalus due to the worsening cerebellar edema on the right.  Diffuse anoxic brain injury with evidence of increased pressure globally.  Spoke with the father.  Reports years of drug abuse.  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 She has 4 children, who lives with the father of the children who has custody. Father of the patient thinks that she has been down since Friday, 07/10/2019.  He reports this pattern of her disappearing with friends, abusing drugs and then coming around back.  He says that he was anticipating a call like this where she would be in critical condition or near death because of her drug use for years.  O:// Current vital signs: BP (!) 77/44   Pulse (!) 113   Temp 99.7 F (37.6 C)   Resp (!) 22   Ht '5\' 2"'$  (1.575 m)   Wt 61.7 kg   LMP  (LMP Unknown)   SpO2 91%   BMI 24.88 kg/m  Vital signs in last 24 hours: Temp:  [95 F (35 C)-102 F (38.9 C)] 99.7 F (37.6 C) (01/25 0800) Pulse Rate:  [79-137] 113 (01/25 0800) Resp:  [0-27] 22 (01/25 0800) BP: (70-180)/(43-154) 77/44 (01/25 0800) SpO2:  [77 %-100 %] 91 % (01/25 0808) FiO2 (%):  [50 %-100 %] 100 % (01/25 0808) Weight:  [61.7 kg] 61.7 kg (01/25 0500) Neurological exam Sedated intubated Not following any commands Does not open eyes to voice or noxious stimulation. There was one episode of spontaneous flicker in the right upper extremity. Cranial nerves: Pupils are 7 mm nonreactive to light fixed and dilated, does not blink to threat, minimal corneals, breathing with the vent. Motor exam: No response to noxious stimulation in any of the 4 extremities Sensory exam: As above  Medications  Current Facility-Administered Medications:  .  aspirin chewable tablet 81 mg, 81 mg, Oral, Daily, Amie Portland, MD, 81 mg at 07/18/2019 1807 .   chlorhexidine gluconate (MEDLINE KIT) (PERIDEX) 0.12 % solution 15 mL, 15 mL, Mouth Rinse, BID, Agarwala, Ravi, MD, 15 mL at 06/20/2019 2004 .  Chlorhexidine Gluconate Cloth 2 % PADS 6 each, 6 each, Topical, Daily, Kipp Brood, MD, 6 each at 06/23/2019 1544 .  feeding supplement (PRO-STAT SUGAR FREE 64) liquid 30 mL, 30 mL, Per Tube, BID, Agarwala, Ravi, MD, 30 mL at 07/08/2019 2127 .  feeding supplement (VITAL HIGH PROTEIN) liquid 1,000 mL, 1,000 mL, Per Tube, Q24H, Agarwala, Ravi, MD, Given by Other at 08/10/19 0600 .  fentaNYL (SUBLIMAZE) bolus via infusion 50 mcg, 50 mcg, Intravenous, Q15 min PRN, Scatliffe, Kristen D, MD .  heparin injection 5,000 Units, 5,000 Units, Subcutaneous, Q8H, Gleason, Otilio Carpen, PA-C, 5,000 Units at 08-10-19 0507 .  insulin aspart (novoLOG) injection 1-3 Units, 1-3 Units, Subcutaneous, Q4H, Kathi Ludwig, MD, 2 Units at 08-10-19 0430 .  [START ON 07/15/2019] levofloxacin (LEVAQUIN) IVPB 750 mg, 750 mg, Intravenous, Q48H, Franky Macho, RPH .  MEDLINE mouth rinse, 15 mL, Mouth Rinse, 10 times per day, Kipp Brood, MD, 15 mL at August 10, 2019 0514 .  metroNIDAZOLE (FLAGYL) IVPB 500 mg, 500 mg, Intravenous, Q8H, Anders Simmonds, MD .  norepinephrine (LEVOPHED) '4mg'$  in 229m premix infusion, 0-40 mcg/min, Intravenous, Titrated, Agarwala, Ravi, MD, Last Rate: 187.5 mL/hr at 02021-02-220809, 50 mcg/min at [id number] .  pantoprazole (PROTONIX) injection 40 mg, 40 mg, Intravenous,  QHS, Gleason, Otilio Carpen, PA-C, 40 mg at 06/28/2019 2124 .  sodium bicarbonate 150 mEq in dextrose 5% 1000 mL infusion, 150 mEq, Intravenous, Continuous, Harbrecht, Lawrence, MD, Last Rate: 150 mL/hr at July 29, 2019 0600, Rate Verify at Jul 29, 2019 0600 .  sodium chloride 23.4 % (4 mEq/mL) IV in VIAFLEX BAG, 120 mEq, Intravenous, Once, Agarwala, Ravi, MD .  sodium chloride flush (NS) 0.9 % injection 9 mL, 9 mL, Intravenous, Once, Gleason, Otilio Carpen, PA-C Labs CBC    Component Value Date/Time   WBC 12.0 (H)  07/29/19 0519   RBC 4.64 07/29/19 0519   HGB 13.8 2019/07/29 0519   HCT 42.3 July 29, 2019 0519   PLT 191 July 29, 2019 0519   MCV 91.2 29-Jul-2019 0519   MCH 29.7 29-Jul-2019 0519   MCHC 32.6 29-Jul-2019 0519   RDW 14.8 July 29, 2019 0519   LYMPHSABS 2.2 07/09/2019 0321   MONOABS 0.3 07/06/2019 0321   EOSABS 0.0 06/20/2019 0321   BASOSABS 0.1 07/11/2019 0321    CMP     Component Value Date/Time   NA 149 (H) 07/29/2019 0519   K 3.2 (L) 07/29/19 0519   CL 106 07-29-19 0519   CO2 28 07-29-2019 0519   GLUCOSE 152 (H) July 29, 2019 0519   BUN 41 (H) 07/29/2019 0519   CREATININE 3.44 (H) Jul 29, 2019 0519   CALCIUM 5.8 (LL) 2019-07-29 0519   PROT 3.5 (L) 07/29/2019 0519   ALBUMIN 1.3 (L) Jul 29, 2019 0519   AST 1,343 (H) 07-29-2019 0519   ALT 541 (H) 29-Jul-2019 0519   ALKPHOS 64 29-Jul-2019 0519   BILITOT 0.6 07/29/19 0519   GFRNONAA 17 (L) 07-29-19 0519   GFRAA 20 (L) Jul 29, 2019 0519   Imaging I have reviewed images in epic and the results pertinent to this consultation are: CT head with 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  Elevated intracranial pressure with progressive swelling in the setting of global anoxic injury.  Assessment: 29 year old woman past medical history of CAD brought in after PEA cardiac arrest with unknown downtime.  There was some concern for foul play on arrival but unclear if that has any substantial grounds. According to the father, she is probably been down since Friday, 07/10/2019.  He reports chronic history of drug abuse for many years now. The patient's examination currently is concerning for herniation with both pupils fixed and dilated. CT head with new obstructive hydrocephalus at the level of the fourth ventricle due to the cerebellar infarct and elevated intracranial pressure with progressive swelling in the setting of global anoxic injury. Detailed discussion with PCCM and neurosurgery.  Due to the global  nature of the insult, a suboccipital craniectomy to relieve the pressure on the fourth ventricle and treat the obstructive hydrocephalus might not be beneficial given the fact that she is in florid renal and hepatic failure at this time as well and has been having problems with ventilation requiring high PEEP. At this time, the prognosis for neurologically meaningful recovery, and frankly for survival is very poor. Goals of care conversations and withdrawal of care should be discussed with family  Impression: Acute ischemic stroke Severe anoxic brain injury Multisystem organ failure Cocaine abuse  Recommendations: Not a good neurosurgical candidate-see discussion above. Multisystem organ failure is likely going to be in this case. Goals of care discussion should be had with family. I discussed the neurological findings including showing the father the MRI scan and CT scan and explaining the findings.  He seems understanding of the situation and is  waiting for the patient's mother to arrive from [place].  CODE STATUS discussion and goals of care discussion per primary care.  Detailed discussion with Dr. Kathyrn Sheriff from neurosurgery and Dr. Lynetta Mare from PCCM  -- Amie Portland, MD [facility] Pager: [phone] If 7pm to 7am, please call on call as listed on [facility].   CRITICAL CARE ATTESTATION Performed by: Amie Portland, MD Total critical care time: 45 minutes Critical care time was exclusive of separately billable procedures and treating other patients and/or supervising APPs/Residents/Students Critical care was necessary to treat or prevent imminent or life-threatening deterioration due to severe anoxic brain injury, acute ischemic stroke, multisystem organ failure, cocaine abuse This patient is critically ill and at significant risk for neurological worsening and/or death and care requires constant monitoring. Critical care was time spent personally by me on the following  activities: development of treatment plan with patient and/or surrogate as well as nursing, discussions with consultants, evaluation of patient's response to treatment, examination of patient, obtaining history from patient or surrogate, ordering and performing treatments and interventions, ordering and review of laboratory studies, ordering and review of radiographic studies, pulse oximetry, re-evaluation of patient's condition, participation in multidisciplinary rounds and medical decision making of high complexity in the care of this patient.

## 2019-07-20 NOTE — Death Summary Note (Signed)
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 Mild-to-moderate disc degeneration greatest at C4-C5 and C5-C6. C2-C3: No disc herniation. No significant canal or foraminal stenosis. C3-C4: No disc herniation. No significant canal or foraminal stenosis. C4-C5: Minimal disc bulge. No significant spinal canal or neural foraminal narrowing. C5-C6: Minimal disc bulge  and right-sided uncinate hypertrophy. No significant spinal canal or neural foraminal narrowing. C6-C7: No disc herniation. No significant canal or foraminal stenosis. C7-T1: No disc herniation. No significant canal or foraminal stenosis. IMPRESSION: Mild cervical spondylosis without significant spinal canal or neural foraminal narrowing at any level. No evidence of spinal cord signal abnormality or epidural hematoma. Marked nonspecific soft tissue and intramuscular edema within the bilateral neck (greater on the right). This includes significant edema and swelling throughout the right sternocleidomastoid muscle. Edema also extends of the parapharyngeal spaces bilaterally. There is more focal edema within the left paraspinal musculature along the left C4 lamina. Prevertebral edema versus hematoma extending from the skull base to the C6 level, measuring up to 8 mm in AP dimension. Electronically Signed   By: Jackey Loge DO   On: 06/27/2019 14:31   DG Chest Port 1 View  Result Date: 06/20/2019 CLINICAL DATA:  Central line placement. EXAM:  PORTABLE CHEST 1 VIEW COMPARISON:  Earlier this day. FINDINGS: Endotracheal tube is been retracted, tip now 3.2 cm from the carina. Enteric tube in place with tip and side-port below the diaphragm. Right internal jugular central venous catheter tip in the mid lower SVC. No pneumothorax. Low lung volumes. Minimal linear atelectasis or scarring in the right mid upper lung zone. Lungs otherwise clear. No pleural fluid. Normal heart size and mediastinal contours. IMPRESSION: 1. Tip of the right internal jugular central venous catheter in the mid lower SVC. No pneumothorax. 2. Endotracheal tube tip now 3.2 cm from the carina. 3. Minimal atelectasis or scarring in the right mid upper lung zone. Electronically Signed   By: Narda Rutherford M.D.   On: 07/15/2019 03:38   DG Chest Port 1 View  Result Date: 06/27/2019 CLINICAL DATA:  Intubation. Cardiac arrest. EXAM: PORTABLE CHEST 1 VIEW COMPARISON:  None. FINDINGS: Right mainstem intubation. Recommend retraction of approximately 3 cm. Enteric tube tip and side-port below the diaphragm in the stomach. Clear lungs. Normal cardiomediastinal contours. No pulmonary edema, pleural effusion or pneumothorax. No acute osseous abnormalities are seen. IMPRESSION: Right mainstem intubation. Recommend retraction of approximately 2-3 cm. Enteric tube tip and side-port below the diaphragm in the stomach. These results were called by telephone at the time of interpretation on 07/11/2019 at 2:12 am to Dr Jaci Carrel , who verbally acknowledged these results. Electronically Signed   By: Narda Rutherford M.D.   On: 07/17/2019 02:13   DG Knee Right Port  Result Date: 07/15/2019 CLINICAL DATA:  Unknown trauma. EXAM: PORTABLE RIGHT KNEE - 1-2 VIEW COMPARISON:  None. FINDINGS: No evidence of fracture, dislocation, or joint effusion. No evidence of arthropathy or other focal bone abnormality. There is some mild soft tissue swelling about the medial aspect of the knee. IMPRESSION:  Negative. Electronically Signed   By: Katherine Mantle M.D.   On: 07/05/2019 15:22   ECHOCARDIOGRAM COMPLETE BUBBLE STUDY  Result Date: 07/09/2019   ECHOCARDIOGRAM REPORT   Patient Name:   Jenny Myers Date of Exam: 06/20/2019 Medical Rec #:  [id number]      Height:       62.0 in Accession #:    [id number]     Weight:       130.0 lb Date of Birth:  1991/03/10       BSA:          1.59 m Patient Age:    28 years       BP:           149/44 mmHg  Patient Gender: F              HR:           89 bpm. Exam Location:  Inpatient Procedure: 2D Echo Indications:    Stroke 434.91  History:        Patient has no prior history of Echocardiogram examinations.                 Cardiac arrest.  Sonographer:    Ross Ludwig RDCS (AE) Referring Phys: [id number] Aliene Beams  Sonographer Comments: Echo performed with patient supine and on artificial respirator. Image acquisition challenging due to respiratory motion. Cervical collar in place limiting probe placement for parasternal images and preventing imaging at suprasternal notch. IMPRESSIONS  1. Left ventricular ejection fraction, by visual estimation, is 40%. The left ventricle has mild to moderately decreased function. There is no left ventricular hypertrophy.  2. Global right ventricle has normal systolic function.The right ventricular size is not well visualized. Right vetricular wall thickness was not assessed.  3. The mitral valve is normal in structure. Trivial mitral valve regurgitation. No evidence of mitral stenosis.  4. The tricuspid valve is not well visualized. Tricuspid valve regurgitation is trivial.  5. The aortic valve was not well visualized. Aortic valve regurgitation is not visualized. No evidence of aortic valve sclerosis or stenosis.  6. The pulmonic valve was not well visualized.  7. The inferior vena cava is normal in size with greater than 50% respiratory variability, suggesting right atrial pressure of 3 mmHg. FINDINGS  Left Ventricle: Left ventricular  ejection fraction, by visual estimation, is 40%. The left ventricle has mild to moderately decreased function. The left ventricle is not well visualized. There is no left ventricular hypertrophy. Left ventricular diastolic parameters are indeterminate. Normal left atrial pressure. Right Ventricle: The right ventricular size is not well visualized. Right vetricular wall thickness was not assessed. Global RV systolic function is has normal systolic function. Left Atrium: Left atrial size was normal in size. Right Atrium: Right atrial size was normal in size Pericardium: There is no evidence of pericardial effusion. Mitral Valve: The mitral valve is normal in structure. Trivial mitral valve regurgitation. No evidence of mitral valve stenosis by observation. Tricuspid Valve: The tricuspid valve is not well visualized. Tricuspid valve regurgitation is trivial. Aortic Valve: The aortic valve was not well visualized. Aortic valve regurgitation is not visualized. The aortic valve is structurally normal, with no evidence of sclerosis or stenosis. Aortic valve mean gradient measures 3.0 mmHg. Aortic valve peak gradient measures 4.4 mmHg. Aortic valve area, by VTI measures 2.29 cm. Pulmonic Valve: The pulmonic valve was not well visualized. Aorta: The aortic root was not well visualized. Venous: The inferior vena cava is normal in size with greater than 50% respiratory variability, suggesting right atrial pressure of 3 mmHg. IAS/Shunts: No atrial level shunt detected by color flow Doppler. Indeterminate bubble study.  LEFT VENTRICLE PLAX 2D LVIDd:         3.60 cm  Diastology LVIDs:         3.00 cm  LV e' lateral:   10.70 cm/s LV PW:         1.00 cm  LV E/e' lateral: 3.8 LV IVS:        1.00 cm  LV e' medial:    8.70 cm/s LVOT diam:     1.80 cm  LV E/e' medial:  4.7 LV SV:         19 ml LV  SV Index:   12.03 LVOT Area:     2.54 cm  RIGHT VENTRICLE             IVC RV Basal diam:  2.50 cm     IVC diam: 1.30 cm RV S prime:      10.70 cm/s TAPSE (M-mode): 1.5 cm LEFT ATRIUM           Index       RIGHT ATRIUM           Index LA diam:      2.20 cm 1.38 cm/m  RA Area:     12.30 cm LA Vol (A2C): 15.2 ml 9.55 ml/m  RA Volume:   29.60 ml  18.59 ml/m LA Vol (A4C): 22.5 ml 14.13 ml/m  AORTIC VALVE AV Area (Vmax):    2.13 cm AV Area (Vmean):   2.18 cm AV Area (VTI):     2.29 cm AV Vmax:           105.00 cm/s AV Vmean:          73.900 cm/s AV VTI:            0.122 m AV Peak Grad:      4.4 mmHg AV Mean Grad:      3.0 mmHg LVOT Vmax:         87.70 cm/s LVOT Vmean:        63.300 cm/s LVOT VTI:          0.110 m LVOT/AV VTI ratio: 0.90  AORTA Ao Root diam: 2.10 cm MITRAL VALVE                        TRICUSPID VALVE MV Area (PHT): 2.69 cm             TR Peak grad:   18.3 mmHg MV PHT:        81.78 msec           TR Vmax:        214.00 cm/s MV Decel Time: 282 msec MV E velocity: 41.00 cm/s 103 cm/s  SHUNTS MV A velocity: 40.20 cm/s 70.3 cm/s Systemic VTI:  0.11 m MV E/A ratio:  1.02       1.5       Systemic Diam: 1.80 cm  Weston Brass MD Electronically signed by Weston Brass MD Signature Date/Time: 07/09/2019/7:10:36 PM    Final    DG FEMUR PORT, MIN 2 VIEWS RIGHT  Result Date: 07/17/2019 CLINICAL DATA:  Pain. Swelling. Unknown trauma. EXAM: RIGHT FEMUR PORTABLE 2 VIEW COMPARISON:  None. FINDINGS: There is no acute displaced fracture or dislocation. There is nonspecific soft tissue swelling about the lateral proximal aspect of the right thigh. There is no radiopaque foreign body. A Foley catheter is noted. IMPRESSION: No acute displaced fracture or dislocation. Nonspecific soft tissue swelling about the lateral proximal aspect of the right thigh. Electronically Signed   By: Katherine Mantle M.D.   On: 06/27/2019 15:23   CT MAXILLOFACIAL WO CONTRAST  Result Date: 07/14/2019 CLINICAL DATA:  Facial trauma. Cardiac arrest. Found unresponsive. EXAM: CT MAXILLOFACIAL WITHOUT CONTRAST TECHNIQUE: Multidetector CT imaging of the maxillofacial  structures was performed. Multiplanar CT image reconstructions were also generated. COMPARISON:  None. FINDINGS: Osseous: Nondisplaced left nasal fracture, age indeterminate. Nasogastric tube through the right near. Zygomatic arches and mandibles are intact. Temporomandibular joints are congruent. There are scattered missing teeth. Endotracheal tube in place. Orbits: No acute orbital fracture.  Both orbits and globes are intact. Sinuses: No sinus fracture or fluid level. Small mucous retention cyst in the right maxillary sinus. Mastoid air cells are clear. Soft tissues: Negative. Limited intracranial: Assessed on concurrent head CT, reported separately. IMPRESSION: Nondisplaced left nasal fracture, age indeterminate. No other facial bone fracture. Electronically Signed   By: Narda Rutherford M.D.   On: 08-10-2019 03:36    Microbiology Recent Results (from the past 240 hour(s))  Respiratory Panel by RT PCR (Flu A&B, Covid) - Nasopharyngeal Swab     Status: None   Collection Time: Aug 10, 2019  1:59 AM   Specimen: Nasopharyngeal Swab  Result Value Ref Range Status   SARS Coronavirus 2 by RT PCR NEGATIVE NEGATIVE Final    Comment: (NOTE) SARS-CoV-2 target nucleic acids are NOT DETECTED. The SARS-CoV-2 RNA is generally detectable in upper respiratoy specimens during the acute phase of infection. The lowest concentration of SARS-CoV-2 viral copies this assay can detect is 131 copies/mL. A negative result does not preclude SARS-Cov-2 infection and should not be used as the sole basis for treatment or other patient management decisions. A negative result may occur with  improper specimen collection/handling, submission of specimen other than nasopharyngeal swab, presence of viral mutation(s) within the areas targeted by this assay, and inadequate number of viral copies (<131 copies/mL). A negative result must be combined with clinical observations, patient history, and epidemiological information.  The expected result is Negative. Fact Sheet for Patients:  https://www.moore.com/ Fact Sheet for Healthcare Providers:  https://www.young.biz/ This test is not yet ap proved or cleared by the [place] FDA and  has been authorized for detection and/or diagnosis of SARS-CoV-2 by FDA under an Emergency Use Authorization (EUA). This EUA will remain  in effect (meaning this test can be used) for the duration of the COVID-19 declaration under Section 564(b)(1) of the Act, 21 U.S.C. section 360bbb-3(b)(1), unless the authorization is terminated or revoked sooner.    Influenza A by PCR NEGATIVE NEGATIVE Final   Influenza B by PCR NEGATIVE NEGATIVE Final    Comment: (NOTE) The Xpert Xpress SARS-CoV-2/FLU/RSV assay is intended as an aid in  the diagnosis of influenza from Nasopharyngeal swab specimens and  should not be used as a sole basis for treatment. Nasal washings and  aspirates are unacceptable for Xpert Xpress SARS-CoV-2/FLU/RSV  testing. Fact Sheet for Patients: https://www.moore.com/ Fact Sheet for Healthcare Providers: https://www.young.biz/ This test is not yet approved or cleared by the [place] FDA and  has been authorized for detection and/or diagnosis of SARS-CoV-2 by  FDA under an Emergency Use Authorization (EUA). This EUA will remain  in effect (meaning this test can be used) for the duration of the  Covid-19 declaration under Section 564(b)(1) of the Act, 21  U.S.C. section 360bbb-3(b)(1), unless the authorization is  terminated or revoked. Performed at [facility], [address], [place], [place] [address]   Culture, blood (routine x 2)     Status: None   Collection Time: 08-10-19  2:00 AM   Specimen: BLOOD  Result Value Ref Range Status   Specimen Description BLOOD RIGHT FEMORAL ARTERY  Final   Special Requests   Final    BOTTLES DRAWN AEROBIC AND ANAEROBIC Blood Culture  adequate volume   Culture   Final    NO GROWTH 5 DAYS Performed at [facility], [address], [place], [place] [address]    Report Status 07/17/2019 FINAL  Final  MRSA PCR Screening  Status: None   Collection Time: 08/02/19  6:22 AM   Specimen: Nasal Mucosa; Nasopharyngeal  Result Value Ref Range Status   MRSA by PCR NEGATIVE NEGATIVE Final    Comment:        The GeneXpert MRSA Assay (FDA approved for NASAL specimens only), is one component of a comprehensive MRSA colonization surveillance program. It is not intended to diagnose MRSA infection nor to guide or monitor treatment for MRSA infections. Performed at [facility], [address], [place], [place] [address]   Culture, blood (routine x 2)     Status: None   Collection Time: Aug 02, 2019  7:06 AM   Specimen: BLOOD  Result Value Ref Range Status   Specimen Description BLOOD LEFT ANTECUBITAL  Final   Special Requests   Final    BOTTLES DRAWN AEROBIC ONLY Blood Culture results may not be optimal due to an inadequate volume of blood received in culture bottles   Culture   Final    NO GROWTH 5 DAYS Performed at [facility], [address], [place], [place] [address]    Report Status 07/17/2019 FINAL  Final  Culture, respiratory (non-expectorated)     Status: None   Collection Time: 06/24/2019  4:01 AM   Specimen: Tracheal Aspirate; Respiratory  Result Value Ref Range Status   Specimen Description TRACHEAL ASPIRATE  Final   Special Requests NONE  Final   Gram Stain   Final    ABUNDANT WBC PRESENT, PREDOMINANTLY PMN ABUNDANT GRAM POSITIVE COCCI IN CHAINS RARE GRAM NEGATIVE RODS RARE YEAST Performed at [facility], [address], [place], [place] [address]    Culture   Final    ABUNDANT GROUP A STREP (S.PYOGENES) ISOLATED RARE CANDIDA ALBICANS    Report Status 07/17/2019 FINAL  Final  Culture, blood (Routine X 2) w Reflex to ID Panel     Status: Abnormal   Collection Time: 07/09/2019  5:50  AM   Specimen: BLOOD RIGHT HAND  Result Value Ref Range Status   Specimen Description BLOOD RIGHT HAND  Final   Special Requests   Final    BOTTLES DRAWN AEROBIC AND ANAEROBIC Blood Culture results may not be optimal due to an inadequate volume of blood received in culture bottles   Culture  Setup Time   Final    IN BOTH AEROBIC AND ANAEROBIC BOTTLES GRAM POSITIVE COCCI PATIENT DISCHARGED OR EXPIRED PATIENT DECEASED    Culture (A)  Final    STREPTOCOCCUS PYOGENES HEALTH DEPARTMENT NOTIFIED Performed at [facility], [address], [place], [place] [address]    Report Status 07/14/2019 FINAL  Final  Culture, blood (Routine X 2) w Reflex to ID Panel     Status: Abnormal   Collection Time: 07/06/2019  7:51 AM   Specimen: BLOOD RIGHT FOREARM  Result Value Ref Range Status   Specimen Description BLOOD RIGHT FOREARM  Final   Special Requests   Final    BOTTLES DRAWN AEROBIC AND ANAEROBIC Blood Culture adequate volume   Culture  Setup Time   Final    GRAM POSITIVE COCCI IN CHAINS ANAEROBIC BOTTLE ONLY CRITICAL RESULT CALLED TO, READ BACK BY AND VERIFIED WITH: PATIENT DISCHARGED OR EXPIRED PATIENT DECEASED    Culture (A)  Final    GROUP A STREP (S.PYOGENES) ISOLATED HEALTH DEPARTMENT NOTIFIED Performed at [facility], [address], [place], [place] [address]    Report Status 07/16/2019 FINAL  Final    Lab  Basic Metabolic Panel: Recent Labs  Lab Jul 21, 2019 0422 07-21-19 0645 07-21-19 0733 2019-07-21 1355 07-21-19 1627 21-Jul-2019 2317 06/30/2019 0519  NA  --    < > 142 144 145 149* 149*  K  --    < > 6.0* 3.9 3.7 3.4* 3.2*  CL  --    < > 106 106 107 110 106  CO2  --    < > 22 26 27 26 28   GLUCOSE  --    < > 122* 123* 147* 148* 152*  BUN  --    < > 30* 31* 32* 36* 41*  CREATININE 2.93*   < > 2.37* 2.60* 2.79* 3.14* 3.44*  CALCIUM  --    < > 5.4* 6.6* 6.2* 6.0* 5.8*  MG 2.7*  --   --   --  1.9  --  1.4*  PHOS  --   --   --   --  5.3*  --  2.2*   < > = values in  this interval not displayed.   Liver Function Tests: Recent Labs  Lab 07/21/19 0321 07/21/2019 0733 07/11/2019 0519  AST 838* 1,353* 1,343*  ALT 687* 908* 541*  ALKPHOS 110 98 64  BILITOT 0.8 1.3* 0.6  PROT 4.8* 4.8* 3.5*  ALBUMIN 2.5* 2.4* 1.3*   Recent Labs  Lab 21-Jul-2019 0153  LIPASE 90*   Recent Labs  Lab Jul 21, 2019 0153  AMMONIA 215*   CBC: Recent Labs  Lab 07/21/2019 0301 Jul 21, 2019 0321 2019-07-21 0422 07/21/19 0645 06/23/2019 0519  WBC  --  26.3* 23.6*  --  12.0*  NEUTROABS  --  23.0*  --   --   --   HGB 12.6 13.1 14.1 15.0 13.8  HCT 37.0 44.7 46.1* 44.0 42.3  MCV  --  101.8* 97.3  --  91.2  PLT  --  377 364  --  191   Cardiac Enzymes: Recent Labs  Lab Jul 21, 2019 0205 2019-07-21 1355 07/21/2019 2317  CKTOTAL 35,503* >50,000* 40,010*   Sepsis Labs: Recent Labs  Lab 07-21-2019 0315 July 21, 2019 0321 21-Jul-2019 0422 Jul 21, 2019 0733 07-21-19 1017 06/23/2019 0519  WBC  --  26.3* 23.6*  --   --  12.0*  LATICACIDVEN 8.7*  --   --  3.9* 5.1* 4.8*    Procedures/Operations  Mechanical ventilation   Jenny Myers 07/17/2019, 3:13 PM

## 2019-07-20 NOTE — Progress Notes (Signed)
Patient extubated per MD order to comfort measures. RN and family at bedside.
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
## 2019-07-20 NOTE — Progress Notes (Signed)
Chaplain engaged in initial visit with Jenny Myers and her father, Jenny Myers.  Chaplain offered support and prayer around [place] and with her father.  Chaplain told Jenny Myers she would be here throughout the day and provide support to him and Aybree's incoming family from [place].    Chaplain will continue to follow-up throughout the day.  Jenny Myers said that Bona's mom is expected to arrive around noon with other family.
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
## 2019-07-20 DEATH — deceased
